# Patient Record
Sex: Female | Born: 1945 | Race: Black or African American | Hispanic: No | Marital: Married | Smoking: Never smoker
Health system: Southern US, Community
[De-identification: ages and names within clinical notes are randomized; demographics above are authoritative.]

## PROBLEM LIST (undated history)

## (undated) DIAGNOSIS — L659 Nonscarring hair loss, unspecified: Secondary | ICD-10-CM

## (undated) DIAGNOSIS — I10 Essential (primary) hypertension: Secondary | ICD-10-CM

## (undated) DIAGNOSIS — E785 Hyperlipidemia, unspecified: Secondary | ICD-10-CM

## (undated) DIAGNOSIS — H40009 Preglaucoma, unspecified, unspecified eye: Secondary | ICD-10-CM

## (undated) DIAGNOSIS — E119 Type 2 diabetes mellitus without complications: Secondary | ICD-10-CM

## (undated) DIAGNOSIS — D649 Anemia, unspecified: Secondary | ICD-10-CM

## (undated) DIAGNOSIS — D692 Other nonthrombocytopenic purpura: Secondary | ICD-10-CM

## (undated) DIAGNOSIS — R519 Headache, unspecified: Secondary | ICD-10-CM

## (undated) DIAGNOSIS — G629 Polyneuropathy, unspecified: Secondary | ICD-10-CM

## (undated) DIAGNOSIS — L680 Hirsutism: Secondary | ICD-10-CM

## (undated) DIAGNOSIS — M1990 Unspecified osteoarthritis, unspecified site: Secondary | ICD-10-CM

## (undated) DIAGNOSIS — E079 Disorder of thyroid, unspecified: Secondary | ICD-10-CM

## (undated) DIAGNOSIS — K589 Irritable bowel syndrome without diarrhea: Secondary | ICD-10-CM

## (undated) DIAGNOSIS — R195 Other fecal abnormalities: Secondary | ICD-10-CM

## (undated) DIAGNOSIS — R51 Headache: Secondary | ICD-10-CM

## (undated) DIAGNOSIS — N183 Chronic kidney disease, stage 3 unspecified: Secondary | ICD-10-CM

## (undated) DIAGNOSIS — E039 Hypothyroidism, unspecified: Secondary | ICD-10-CM

## (undated) DIAGNOSIS — M4800 Spinal stenosis, site unspecified: Secondary | ICD-10-CM

## (undated) DIAGNOSIS — E7800 Pure hypercholesterolemia, unspecified: Secondary | ICD-10-CM

## (undated) DIAGNOSIS — F32A Depression, unspecified: Secondary | ICD-10-CM

## (undated) DIAGNOSIS — I679 Cerebrovascular disease, unspecified: Secondary | ICD-10-CM

## (undated) DIAGNOSIS — G8929 Other chronic pain: Secondary | ICD-10-CM

## (undated) HISTORY — PX: NECK SURGERY: SHX720

## (undated) HISTORY — DX: Irritable bowel syndrome, unspecified: K58.9

## (undated) HISTORY — DX: Other nonthrombocytopenic purpura: D69.2

## (undated) HISTORY — DX: Nonscarring hair loss, unspecified: L65.9

## (undated) HISTORY — DX: Headache, unspecified: R51.9

## (undated) HISTORY — DX: Depression, unspecified: F32.A

## (undated) HISTORY — DX: Chronic kidney disease, stage 3 unspecified: N18.30

## (undated) HISTORY — DX: Anemia, unspecified: D64.9

## (undated) HISTORY — DX: Hirsutism: L68.0

## (undated) HISTORY — DX: Headache: R51

## (undated) HISTORY — DX: Hyperlipidemia, unspecified: E78.5

## (undated) HISTORY — DX: Cerebrovascular disease, unspecified: I67.9

## (undated) HISTORY — DX: Type 2 diabetes mellitus without complications: E11.9

## (undated) HISTORY — DX: Unspecified osteoarthritis, unspecified site: M19.90

## (undated) HISTORY — DX: Preglaucoma, unspecified, unspecified eye: H40.009

## (undated) HISTORY — DX: Other fecal abnormalities: R19.5

## (undated) HISTORY — DX: Polyneuropathy, unspecified: G62.9

---

## 1997-08-21 ENCOUNTER — Ambulatory Visit (HOSPITAL_COMMUNITY): Admission: RE | Admit: 1997-08-21 | Discharge: 1997-08-21 | Payer: Self-pay | Admitting: Obstetrics

## 1998-08-13 ENCOUNTER — Other Ambulatory Visit: Admission: RE | Admit: 1998-08-13 | Discharge: 1998-08-13 | Payer: Self-pay | Admitting: Obstetrics

## 1998-09-01 ENCOUNTER — Encounter: Payer: Self-pay | Admitting: Emergency Medicine

## 1998-09-01 ENCOUNTER — Emergency Department (HOSPITAL_COMMUNITY): Admission: EM | Admit: 1998-09-01 | Discharge: 1998-09-01 | Payer: Self-pay | Admitting: Emergency Medicine

## 1998-09-19 ENCOUNTER — Ambulatory Visit (HOSPITAL_COMMUNITY): Admission: RE | Admit: 1998-09-19 | Discharge: 1998-09-19 | Payer: Self-pay | Admitting: Obstetrics

## 1998-09-19 ENCOUNTER — Encounter: Payer: Self-pay | Admitting: Obstetrics

## 1999-06-14 ENCOUNTER — Encounter: Payer: Self-pay | Admitting: Orthopaedic Surgery

## 1999-06-14 ENCOUNTER — Ambulatory Visit (HOSPITAL_COMMUNITY): Admission: RE | Admit: 1999-06-14 | Discharge: 1999-06-14 | Payer: Self-pay | Admitting: Orthopaedic Surgery

## 1999-09-25 ENCOUNTER — Encounter: Payer: Self-pay | Admitting: Obstetrics

## 1999-09-25 ENCOUNTER — Ambulatory Visit (HOSPITAL_COMMUNITY): Admission: RE | Admit: 1999-09-25 | Discharge: 1999-09-25 | Payer: Self-pay | Admitting: Obstetrics

## 1999-09-25 ENCOUNTER — Other Ambulatory Visit: Admission: RE | Admit: 1999-09-25 | Discharge: 1999-09-25 | Payer: Self-pay | Admitting: Obstetrics

## 1999-10-04 ENCOUNTER — Ambulatory Visit (HOSPITAL_COMMUNITY): Admission: RE | Admit: 1999-10-04 | Discharge: 1999-10-04 | Payer: Self-pay | Admitting: Obstetrics

## 1999-10-04 ENCOUNTER — Encounter: Payer: Self-pay | Admitting: Obstetrics

## 2000-10-07 ENCOUNTER — Other Ambulatory Visit: Admission: RE | Admit: 2000-10-07 | Discharge: 2000-10-07 | Payer: Self-pay | Admitting: Obstetrics

## 2000-10-12 ENCOUNTER — Ambulatory Visit (HOSPITAL_COMMUNITY): Admission: RE | Admit: 2000-10-12 | Discharge: 2000-10-12 | Payer: Self-pay | Admitting: Obstetrics

## 2000-10-12 ENCOUNTER — Encounter: Payer: Self-pay | Admitting: Obstetrics

## 2001-08-13 ENCOUNTER — Ambulatory Visit (HOSPITAL_COMMUNITY): Admission: RE | Admit: 2001-08-13 | Discharge: 2001-08-13 | Payer: Self-pay | Admitting: Hematology and Oncology

## 2001-08-13 ENCOUNTER — Encounter (INDEPENDENT_AMBULATORY_CARE_PROVIDER_SITE_OTHER): Payer: Self-pay | Admitting: Specialist

## 2001-10-13 ENCOUNTER — Encounter: Payer: Self-pay | Admitting: Obstetrics

## 2001-10-13 ENCOUNTER — Ambulatory Visit (HOSPITAL_COMMUNITY): Admission: RE | Admit: 2001-10-13 | Discharge: 2001-10-13 | Payer: Self-pay | Admitting: Obstetrics

## 2001-11-18 ENCOUNTER — Encounter: Admission: RE | Admit: 2001-11-18 | Discharge: 2002-02-16 | Payer: Self-pay | Admitting: Orthopaedic Surgery

## 2001-12-09 ENCOUNTER — Encounter: Admission: RE | Admit: 2001-12-09 | Discharge: 2001-12-09 | Payer: Self-pay | Admitting: Orthopaedic Surgery

## 2001-12-09 ENCOUNTER — Encounter: Payer: Self-pay | Admitting: Orthopaedic Surgery

## 2002-02-17 ENCOUNTER — Encounter: Admission: RE | Admit: 2002-02-17 | Discharge: 2002-05-02 | Payer: Self-pay | Admitting: Orthopaedic Surgery

## 2002-09-22 ENCOUNTER — Observation Stay (HOSPITAL_COMMUNITY): Admission: EM | Admit: 2002-09-22 | Discharge: 2002-09-23 | Payer: Self-pay | Admitting: Emergency Medicine

## 2002-09-22 ENCOUNTER — Encounter: Payer: Self-pay | Admitting: Emergency Medicine

## 2002-09-23 ENCOUNTER — Encounter: Payer: Self-pay | Admitting: Internal Medicine

## 2002-10-10 ENCOUNTER — Encounter: Payer: Self-pay | Admitting: Orthopaedic Surgery

## 2002-10-10 ENCOUNTER — Encounter: Admission: RE | Admit: 2002-10-10 | Discharge: 2002-10-10 | Payer: Self-pay | Admitting: Orthopaedic Surgery

## 2002-10-17 ENCOUNTER — Encounter: Payer: Self-pay | Admitting: Obstetrics

## 2002-10-17 ENCOUNTER — Ambulatory Visit (HOSPITAL_COMMUNITY): Admission: RE | Admit: 2002-10-17 | Discharge: 2002-10-17 | Payer: Self-pay | Admitting: Obstetrics

## 2002-11-18 ENCOUNTER — Encounter: Payer: Self-pay | Admitting: Orthopaedic Surgery

## 2002-11-18 ENCOUNTER — Ambulatory Visit (HOSPITAL_COMMUNITY): Admission: RE | Admit: 2002-11-18 | Discharge: 2002-11-18 | Payer: Self-pay | Admitting: Orthopaedic Surgery

## 2002-11-22 ENCOUNTER — Encounter: Payer: Self-pay | Admitting: Radiology

## 2002-11-22 ENCOUNTER — Encounter: Admission: RE | Admit: 2002-11-22 | Discharge: 2002-11-22 | Payer: Self-pay | Admitting: Neurosurgery

## 2002-11-22 ENCOUNTER — Encounter: Payer: Self-pay | Admitting: Neurosurgery

## 2002-12-08 ENCOUNTER — Encounter: Admission: RE | Admit: 2002-12-08 | Discharge: 2002-12-08 | Payer: Self-pay | Admitting: Neurosurgery

## 2002-12-08 ENCOUNTER — Encounter: Payer: Self-pay | Admitting: Neurosurgery

## 2003-03-13 ENCOUNTER — Ambulatory Visit (HOSPITAL_COMMUNITY): Admission: RE | Admit: 2003-03-13 | Discharge: 2003-03-13 | Payer: Self-pay | Admitting: Obstetrics

## 2003-03-14 ENCOUNTER — Ambulatory Visit (HOSPITAL_COMMUNITY): Admission: RE | Admit: 2003-03-14 | Discharge: 2003-03-14 | Payer: Self-pay | Admitting: Obstetrics

## 2003-04-04 ENCOUNTER — Encounter (INDEPENDENT_AMBULATORY_CARE_PROVIDER_SITE_OTHER): Payer: Self-pay | Admitting: Specialist

## 2003-04-04 ENCOUNTER — Ambulatory Visit (HOSPITAL_COMMUNITY): Admission: RE | Admit: 2003-04-04 | Discharge: 2003-04-04 | Payer: Self-pay | Admitting: Obstetrics

## 2003-06-27 ENCOUNTER — Encounter: Admission: RE | Admit: 2003-06-27 | Discharge: 2003-06-27 | Payer: Self-pay | Admitting: Orthopaedic Surgery

## 2003-10-18 ENCOUNTER — Ambulatory Visit (HOSPITAL_COMMUNITY): Admission: RE | Admit: 2003-10-18 | Discharge: 2003-10-18 | Payer: Self-pay | Admitting: Obstetrics

## 2004-02-23 ENCOUNTER — Ambulatory Visit: Payer: Self-pay | Admitting: Family Medicine

## 2004-02-28 ENCOUNTER — Ambulatory Visit: Payer: Self-pay | Admitting: Oncology

## 2004-03-01 ENCOUNTER — Ambulatory Visit: Payer: Self-pay | Admitting: Gastroenterology

## 2004-05-20 ENCOUNTER — Encounter: Admission: RE | Admit: 2004-05-20 | Discharge: 2004-05-20 | Payer: Self-pay | Admitting: Neurosurgery

## 2004-10-18 ENCOUNTER — Ambulatory Visit (HOSPITAL_COMMUNITY): Admission: RE | Admit: 2004-10-18 | Discharge: 2004-10-18 | Payer: Self-pay | Admitting: Obstetrics

## 2004-11-04 ENCOUNTER — Ambulatory Visit (HOSPITAL_COMMUNITY): Admission: RE | Admit: 2004-11-04 | Discharge: 2004-11-04 | Payer: Self-pay | Admitting: Obstetrics

## 2004-11-29 ENCOUNTER — Encounter: Admission: RE | Admit: 2004-11-29 | Discharge: 2004-11-29 | Payer: Self-pay | Admitting: Neurosurgery

## 2005-08-01 ENCOUNTER — Inpatient Hospital Stay (HOSPITAL_COMMUNITY): Admission: RE | Admit: 2005-08-01 | Discharge: 2005-08-03 | Payer: Self-pay | Admitting: Neurosurgery

## 2005-12-08 ENCOUNTER — Ambulatory Visit (HOSPITAL_COMMUNITY): Admission: RE | Admit: 2005-12-08 | Discharge: 2005-12-08 | Payer: Self-pay | Admitting: Obstetrics

## 2007-02-03 ENCOUNTER — Ambulatory Visit (HOSPITAL_COMMUNITY): Admission: RE | Admit: 2007-02-03 | Discharge: 2007-02-03 | Payer: Self-pay | Admitting: Obstetrics

## 2008-02-07 ENCOUNTER — Encounter: Admission: RE | Admit: 2008-02-07 | Discharge: 2008-02-07 | Payer: Self-pay | Admitting: Obstetrics

## 2008-02-09 ENCOUNTER — Encounter: Admission: RE | Admit: 2008-02-09 | Discharge: 2008-02-09 | Payer: Self-pay | Admitting: Neurosurgery

## 2008-05-15 ENCOUNTER — Encounter: Admission: RE | Admit: 2008-05-15 | Discharge: 2008-05-15 | Payer: Self-pay | Admitting: Neurosurgery

## 2009-03-12 ENCOUNTER — Encounter: Admission: RE | Admit: 2009-03-12 | Discharge: 2009-03-12 | Payer: Self-pay | Admitting: Obstetrics

## 2009-03-19 ENCOUNTER — Encounter: Admission: RE | Admit: 2009-03-19 | Discharge: 2009-03-19 | Payer: Self-pay | Admitting: Obstetrics

## 2009-12-25 ENCOUNTER — Encounter: Admission: RE | Admit: 2009-12-25 | Discharge: 2009-12-25 | Payer: Self-pay | Admitting: Neurosurgery

## 2010-03-20 ENCOUNTER — Encounter: Admission: RE | Admit: 2010-03-20 | Discharge: 2010-03-20 | Payer: Self-pay | Admitting: Obstetrics

## 2010-05-12 ENCOUNTER — Encounter: Payer: Self-pay | Admitting: Obstetrics

## 2010-06-24 ENCOUNTER — Other Ambulatory Visit: Payer: Self-pay | Admitting: Neurosurgery

## 2010-06-24 DIAGNOSIS — M549 Dorsalgia, unspecified: Secondary | ICD-10-CM

## 2010-07-01 ENCOUNTER — Other Ambulatory Visit: Payer: Self-pay | Admitting: Neurosurgery

## 2010-07-01 ENCOUNTER — Ambulatory Visit
Admission: RE | Admit: 2010-07-01 | Discharge: 2010-07-01 | Disposition: A | Discharge status: Home / Self Care | Payer: Worker's Compensation | Source: Ambulatory Visit | Attending: Neurosurgery | Admitting: Neurosurgery

## 2010-07-01 DIAGNOSIS — M549 Dorsalgia, unspecified: Secondary | ICD-10-CM

## 2010-09-06 NOTE — Discharge Summary (Signed)
Lauren Garrett, Lauren Garrett           ACCOUNT NO.:  [id number]   MEDICAL RECORD NO.:  [id number]          PATIENT TYPE:  INP   LOCATION:  3035                         FACILITY:  [facility]   PHYSICIAN:  Hewitt Shorts, M.D.DATE OF BIRTH:  06-27-1945   DATE OF ADMISSION:  08/01/2005  DATE OF DISCHARGE:  08/03/2005                                 DISCHARGE SUMMARY   HISTORY OF PRESENT ILLNESS:  The patient is a 65 year old woman under the  care of Dr. Franky Macho who he has followed apparently for both difficulties  with her neck and back.  She had a previous C5/6 anterior cervical  discectomy and fusion by Dr. Gerlene Fee 13 years ago but has developed advanced  degenerative disc disease and spondylosis at the C4/5 and C6/7 levels and  Dr. Franky Macho admitted the patient for decompression and arthrodesis.  Details  of his admission history and physical examination are included in his  admission note.   HOSPITAL COURSE:  The patient is admitted by Dr. Franky Macho and underwent C4/5  and C6/7 anterior cervical discectomy and arthrodesis interbody implants and  plating.  She has done well following surgery.  She has been up and  ambulating.  However, she feels both her neck as well as underlying lumbar  stenosis limits her overall level of activity and function at home.  She is  afebrile.  Her wound is healing nicely.  She is moving all four extremities  well.   The patient is asked to be discharged today.  She has requested referral for  home health aide for assistance approximately two hours per day as well as  referral for home health physical therapy.  I have explained to the patient  that a home health aide is typically not covered by insurance and she is  going to follow up with that with the agency to which they are referred.  The patient was given instructions regarding wound care and activities.  She  is to return to see Dr. Franky Macho in a couple of weeks.  Dr. Franky Macho gave her  a prescription for  Percocet and Zanaflex.   DISCHARGE DIAGNOSES:  1.  Cervical spondylosis.  2.  Cervical degenerative disc disease.      Hewitt Shorts, M.D.  Electronically Signed     RWN/MEDQ  D:  08/03/2005  T:  08/04/2005  Job:  [id number]

## 2010-09-06 NOTE — Op Note (Signed)
NAMEMAYDELIN, DEMING                     ACCOUNT NO.:  [id number]   MEDICAL RECORD NO.:  [id number]                   PATIENT TYPE:  AMB   LOCATION:  SDC                                  FACILITY:  [facility]   PHYSICIAN:  Kathreen Cosier, M.D.           DATE OF BIRTH:  1945/12/15   DATE OF PROCEDURE:  04/04/2003  DATE OF DISCHARGE:                                 OPERATIVE REPORT   PREOPERATIVE DIAGNOSIS:  Endometrial polyp.   PROCEDURE:  D&C hysteroscopy.   ANESTHESIA:  General anesthesia.   SURGEON:  Kathreen Cosier, M.D.   DESCRIPTION OF PROCEDURE:  Under general anesthesia, with the patient in the  lithotomy position, perineum and vagina prepped and draped.  Bladder emptied  with straight catheter.  Bimanual examination revealed the uterus to be 12-  weeks size and large with multiple small myomas.  Negative adnexa.  Weighted  speculum placed in the vagina.  Anterior lip of the cervix grasped with  tenaculum.  The endocervix was curetted.  A small amount of tissue obtained.  Endometrial cavity sounded 10 cm.  Cervix dilated to #24 Shawnie Pons.  The  diagnostic hysteroscope was inserted and the cavity examined and no polyps  were noted.  The hysteroscope was removed and a sharp curettage was  performed.  A large amount of tissue obtained.  Polyp forceps were inserted.  No polyps obtained.  The scope was reinserted and the cavity was clean.  The  fluid deficit was 6 mL.  The patient tolerated the procedure well and was  taken to the recovery room in good condition.                                               Kathreen Cosier, M.D.    BAM/MEDQ  D:  04/04/2003  T:  04/04/2003  Job:  [id number]

## 2010-09-06 NOTE — Procedures (Signed)
[facility]  Patient:    Lauren Garrett, Lauren Garrett Visit Number: [id number] MRN: [id number]          Service Type: OUT Location: OMED Attending Physician:  Janan Halter Dictated by:   Lowell C. Catha Gosselin, M.D. Proc. Date: 08/13/01 Admit Date:  08/13/2001                             Procedure Report  DATE OF BIRTH:  March 22, 1945  PROCEDURE:  Bone marrow biopsy and aspiration.  INDICATION:  Persistent normocytic anemia, family history of myeloproliferative disease.  DESCRIPTION OF PROCEDURE:  From right posterior iliac crest, bone marrow biopsy and aspiration were performed under 2% lidocaine local.  There was minimal bleeding, no obvious complications.  Standard Jamshidi needle was used.  Aspirate was sent off for flow and cytometry and for cytogenetics as well.  In addition, a serum sample was sent for hemoglobin electrophoresis. The patient was instructed to remove her bandage tomorrow and shower.  She will call [phone] for any problems or questions, and will see her back next week to review the results. Dictated by:   Lowell C. Catha Gosselin, M.D. Attending Physician:  Janan Halter DD:  08/13/01 TD:  08/13/01 Job: [phone] [id number]

## 2010-09-06 NOTE — H&P (Signed)
Lauren Garrett, Lauren Garrett                     ACCOUNT NO.:  [id number]   MEDICAL RECORD NO.:  [id number]                   PATIENT TYPE:  OBV   LOCATION:  0373                                 FACILITY:  [facility]   PHYSICIAN:  Gaspar Garbe, M.D.            DATE OF BIRTH:  [birth year]   DATE OF ADMISSION:  09/22/2002  DATE OF DISCHARGE:                                HISTORY & PHYSICAL   CHIEF COMPLAINT:  Syncopal episode, chronic back pain.   HISTORY OF PRESENT ILLNESS:  The patient is a 65 year old black female with  a history of hypertension, hypothyroidism, and non-toxic nodular goiter, as  well as anemia who has not been seen at [facility] since December 02, 2001, and has missed subsequent appointments.  I was notified that she was  at the emergency room after having a syncopal episode.  She indicates that  she has been having pain for a period of just slightly less then a year, and  has been followed by Dr. Cleophas Dunker of orthopaedics.  She indicates that she  has had back injections in the months of April and May, but the most recent  unable to control her pain, and has been given Naprosyn and Skelaxin.  She  is making an attempt to go back to work on the day of admission, and had  gotten up to try to get herself breakfast, in spite of the amount of pain  that she had, at which point a large thud was heard and the patient was  found to be groggy on the floor of her kitchen.  The patient remembers going  to the refrigerator, but does not remember anything after that event other  then being looked at by her family, none of which who actually witnessed the  episode.  The patient was taken to the emergency room and evaluated and was  decided to put in for observation admission.   PAST MEDICAL HISTORY:  1. Hypertension.  2. Hypothyroidism.  3. Anemia.  4. Non-toxic nodular goiter.  5. Elevated lipids.  6. Irritable bowel syndrome.   PAST SURGICAL HISTORY:  C4, C5, and  C6 cervical surgery by Dr. Gerlene Fee  approximately 10 years ago.   SOCIAL HISTORY:  The patient is married with two children.  She works at  [employer].  She is a nonsmoker and nondrinker.   FAMILY HISTORY:  Father is alive with a history of stroke, hypertension,  knee and back problems, as well as renal and lung cancer.  Mother is alive  with a history of blood clots and mild hypertension.  She states  grandparents died of cervical cancer and an aunt died of breast cancer.  She  had one brother who has diabetes and alcohol abuse, pancreatitis, and two  healthy children.   ALLERGIES:  LEVAQUIN.   MEDICATIONS:  1. Synthroid 0.125 mg one tablet daily.  2. Maxzide 37.5/25 mg one tablet b.i.d.  3.  Naprosyn 500 mg b.i.d.  4. Skelaxin 800 mg t.i.d.  5. Iron supplement b.i.d.   REVIEW OF SYMPTOMS:  The patient complains currently of neck and low back  pain, soreness, as well as pain radiating to her left hip.   PHYSICAL EXAMINATION:  VITAL SIGNS:  Temperature afebrile, blood pressure  138/86, saturation 99% on room air, pulse 86.  GENERAL:  In no acute distress.  HEENT:  Normocephalic, atraumatic.  Pupils equal, round, reactive to light  and accommodation.  Extraocular movements were intact.  ENT within normal  limits.  NECK:  Supple, no lymphadenopathy, JVD, or bruit.  HEART:  Regular rate and rhythm, no murmurs, rubs, or gallops.  LUNGS:  Clear to auscultation bilaterally.  ABDOMEN:  Soft, nontender, normoactive bowel sounds, no rebound or guarding.  EXTREMITIES:  No cyanosis, clubbing, or edema.  NEUROLOGIC:  Cranial nerves II-XII are intact.  The patient is oriented x3  with normal grip strength and no cerebellar findings.  SKIN:  No rashes or lesions.  MUSCULOSKELETAL:  The patient does not have any point tenderness in her  back, but notes pain into the lumbar muscles on the left side, as well as  her left hip.  The patient was not walked in the emergency room.   LABORATORY  DATA:  The patient had a CT scan of her hip which was negative.  EKG which was negative.  Chest x-ray which was negative.  Had mild  hypokalemia of 3.2 which was replaced, and a slightly elevated non-fasting  glucose of 128.  BUN and creatinine were 10 and 0.9, respectively, with  normal liver function tests.  Troponin-I was 0.01, CK was 188, CK-MB was  3.6, hemoglobin 12.1, and white blood cell count of 7, platelets 389.   ASSESSMENT AND PLAN:  1. Syncopal episode.  I think this is most likely pain induced, but we will     rule out for myocardial infarction and keep her on telemetry.  Check a     TSH as she has missed her last appointment.  Although, I do not believe     that this would necessarily influence a syncopal episode.  Her low     potassium would not either.  We will keep an eye on this as she is on a     potassium sparing and potassium wasting diuretic combination.  She will     be in the hospital at least overnight for observation, and although     workup is negative, will be cleared from a medical perspective.  2. Chronic back pain.  I spoke with Dr. Mckinley Jewel as he had been on call     for Dr. Cleophas Dunker.  He indicated that he did not do back surgery, and     suggested that I call Dr. Zachery Dakins, and a call has been placed to Dr.     Gerlene Fee at [facility] for consultation, as he has seen her in     the past for her neck surgery.  The patient also requested that Dr.     Cleophas Dunker see the patient tomorrow if he is unable to see her on the day     of admission.  I believe that her syncopal episode is most likely pain     induced, and I have started her on Vicodin for pain, as well as morphine     for breakthrough.  3. Hypothyroidism.  We will check TSH and make adjustments as necessary.  4. Hypertension.  We will continue to follow her on her current regimen. 5. Anemia.  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  We will continue her on her     iron supplements.  She has missed her follow up appointment with Dr.     Darnelle Catalan as well, and will need to schedule this following her discharge     from the hospital.                                                 Gaspar Garbe, M.D.   RWT/MEDQ  D:  09/23/2002  T:  09/23/2002  Job:  [id number]

## 2010-09-06 NOTE — Discharge Summary (Signed)
Lauren Garrett, Lauren Garrett                     ACCOUNT NO.:  [id number]   MEDICAL RECORD NO.:  [id number]                   PATIENT TYPE:  OBV   LOCATION:  0373                                 FACILITY:  [facility]   PHYSICIAN:  Gaspar Garbe, M.D.            DATE OF BIRTH:  19-May-1945   DATE OF ADMISSION:  09/22/2002  DATE OF DISCHARGE:  09/23/2002                                 DISCHARGE SUMMARY   DISCHARGE DIAGNOSES:  1. Syncopal episode, presumed vasovagal.  2. Chronic back pain.  3. Hypothyroidism.  4. Hypokalemia.  5. Hypertension.   TESTS:  Patient had x-rays of hip, pelvis, L spine and T spine all of which  have been within normal limits.   LABS ADMITTING:  Potassium 3.2 and replaced.   DISCHARGE LABS:  Show a BUN of 9 with a creatinine of 0.8.  Mild anemia of  11.3 hemoglobin.  She is currently being followed by heme/onc.  Troponin  0.02, CK elevated at 347 with an MB component of 4.4 consistent with muscle  injury and a negative urinalysis.   SUMMARY HOSPITAL COURSE:  Patient was admitted on September 22, 2002 through the  emergency room secondary to having a syncopal episode at home.  Please see  H&P for details.  Patient was watched overnight on telemetry with no  telemetric changes and was ruled out for MI.  She had normal electrolytes  and thyroid studies.  Given her history it was thought that her syncopal  episode might have been due to pain or anxiety and vasovagal in nature.  Patient was seen by Dr. Haynes Kerns. A. as well as Dr. Gerlene Fee prior to  discharge and followup is planned to be seen with Dr. Gerlene Fee in his office  on Monday September 26, 2002.  Patient had some pain response with Vicodin and was  to be continued at the time of discharge as well.   MEDICATIONS:  1. Vicodin 1-2 tablets q.4h. p.r.n. pain.  2. She has been discontinued on her Skelaxin and Naprosyn.  3. Synthroid 125 mcg p.o. daily.  4. Maxzide 37.5/25 one tablet p.o. b.i.d.    FOLLOW UP:   Patient is to continue her regular followup with Dr. Wylene Simmer for  a complete physical exam as scheduled in September and she is to followup  with Dr. Gerlene Fee on the upcoming Monday and Dr. Cleophas Dunker as needed.                                                Gaspar Garbe, M.D.    RWT/MEDQ  D:  09/23/2002  T:  09/23/2002  Job:  [id number]   cc:   [name], M.D.  [address] [place]  [place] [address]  Fax: [phone]   [name], M.D.  [address]  [place]  [place] [address]  Fax: [phone]

## 2010-09-06 NOTE — Op Note (Signed)
NAMESUKHMANI, FETHEROLF           ACCOUNT NO.:  [id number]   MEDICAL RECORD NO.:  [id number]          PATIENT TYPE:  INP   LOCATION:  2899                         FACILITY:  [facility]   PHYSICIAN:  Coletta Memos, M.D.     DATE OF BIRTH:  1945/06/17   DATE OF PROCEDURE:  08/01/2005  DATE OF DISCHARGE:                                 OPERATIVE REPORT   PREOPERATIVE DIAGNOSES:  1.  Cervical spondylosis with myelopathy, C4-5, C6-7.  2.  Cervical stenosis.  3.  Cervical degenerative disk disease with myelopathy.   POSTOPERATIVE DIAGNOSES:  1.  Cervical spondylosis with myelopathy, C4-5, C6-7.  2.  Cervical stenosis.  3.  Cervical degenerative disk disease with myelopathy.   PROCEDURE:  1.  Anterior cervical decompression, C4-5, C6-7.  2.  Arthrodesis, C4-5, C6-7, with interbody graft, 8-mm P cage placed in C4-      5, 7-mm P cage placed in C6-7, morselized allograft DBX bone putty      placed into the P cages.  3.  Anterior plating 16-mm plate at Z6-1, 09-UE plate at A5-4.   COMPLICATIONS:  None.   SURGEON:  Coletta Memos, M.D.   INDICATIONS:  Tayen Dunlop is a patient who had undergone an anterior  cervical decompression at C5-6 in the past.  She had significant  degenerative changes at C4-5 and at C5-6 leading to degenerative disk  disease, cervical myelopathy secondary to cord compression and cervical  stenosis.  I recommended and she agreed to undergo operative decompression.   OPERATIVE NOTE:  Ms. Dwiggins was brought to the operating room, intubated  and placed under general anesthetic without difficulty.  She was positioned  with the head in essentially neutral position on a horseshoe.  Her neck was  prepped, and she was draped in a sterile fashion.   I opened the skin with a #10 blade after infiltrating 4 cc 1/2% lidocaine  into an old incision on the right side of her neck.  I opened an incision  with a #10 blade and took this down through scar tissue until I was able  to  identify the sternocleidomastoid muscle.  Then, with further sharp  dissection, was able to create a plane to the anterior cervical spine.  I  placed a spinal needle, and it showed that one needle was at C4-5, the other  at C3-4.  I then used that as a guide and opened the disk space at C4-5 with  a #15 blade.  I then placed two distraction pins, one in C4 and one at C5.  I reflected the longus colli muscles, placed a self-retaining retractor,  then placed the distraction pins.  I brought the microscope into the  operative field and proceeded with a diskectomy and decompression at C4-5.  There was a significant amount of osteophyte formation posteriorly and  osteophyte within the disk space.  Using a high-speed drill, Kerrison  punches and curettes, I removed soft disk material, end plate and then bone  until I was able to decompress the posterior longitudinal ligament.  I then  opened the posterior longitudinal ligament and removed that.  There was,  however, an adherent piece of partially calcified ligament and soft tissue  on the left side.  I could not separate this via blunt or sharp dissection  from the dura.  I did not believe that a durotomy was in Ms. Namba's best  interest, so I therefore completed my decompression on the right side.  I  removed the bony entrapment on the left side, and lateral to this partially  calcified mass, I was able to again remove soft tissue.  The cord was  pulsatile on the right side where I could see through the dura, and there  was certainly no pressure from aboveon this mass, so I felt at this point  that adequate decompression could be done without having Ms. Asay incur  greater risk.  I then prepared the endplates for arthrodesis.  I placed an 8-  mm P graft filled with DBX putting into the space.  I removed the  distraction pins and took an x-ray, and this showed that the plate screws  and plugs in good position.  I placed a 16-mm plate  there, first by drilling  and then using self-tapping screws, two screws in C4, two screws in C5.   I then placed a self-retaining retractor to overlie the disk space at C6-7.  I used a Leksell rongeur to remove more very large anterior osteophytes.  Used a 15 blade to open the disk space.  Then placed distraction pins, one  at C6, the other at C7.  I distracted the space.  I brought the microscope  into the operative field, and again with the use of a high-speed drill and  Kerrison punches, removed osteophytes both anteriorly and along the  posterior aspect of the vertebral bodies at C6 and C7.  I then opened the  posterior longitudinal ligament and removed that very thickened ligament to  expose the dura and decompress it.  Both C7 nerve roots were well  decompressed.  After the decompression was done, I turned my attention to  the arthrodesis.   I prepared the endplates for arthrodesis.  I then placed a 7-mm graft, Peak  Synthes ACS graft filled with DBX putty.  I removed the distraction pins.  I  then placed a 14-mm Synthes__________  plate, two screws in C6, two screws  in C7. Three screws were self-tapping; one screw was self-drilling.  Her  bone was extraordinarily dense at all levels.  I then irrigated the wound.  Dr. Newell Coral assisted with both the arthrodesis and anterior plating in both  instances.  We then closed the wound in layered fashion using Vicryl sutures  after ensuring hemostasis.  The platysma was reapproximated in subcuticular  layers.  Dermabond used for sterile dressing.           ______________________________  Coletta Memos, M.D.     KC/MEDQ  D:  08/01/2005  T:  08/01/2005  Job:  [id number]

## 2010-10-08 ENCOUNTER — Other Ambulatory Visit: Payer: Self-pay | Admitting: Neurosurgery

## 2010-10-08 DIAGNOSIS — M549 Dorsalgia, unspecified: Secondary | ICD-10-CM

## 2010-10-14 ENCOUNTER — Ambulatory Visit
Admission: RE | Admit: 2010-10-14 | Discharge: 2010-10-14 | Disposition: A | Discharge status: Home / Self Care | Payer: Worker's Compensation | Source: Ambulatory Visit | Attending: Neurosurgery | Admitting: Neurosurgery

## 2010-10-14 DIAGNOSIS — M549 Dorsalgia, unspecified: Secondary | ICD-10-CM

## 2010-12-11 ENCOUNTER — Other Ambulatory Visit: Payer: Self-pay | Admitting: Obstetrics

## 2010-12-13 ENCOUNTER — Telehealth: Payer: Self-pay | Admitting: Gastroenterology

## 2010-12-13 NOTE — Telephone Encounter (Signed)
Requested chart will have to wait until it comes for review.

## 2010-12-16 NOTE — Telephone Encounter (Signed)
Left message pt needs office visit to discuss colonoscopy since last colonoscopy was done for anemia, I have asked she make an appt and have her primary care md fax over labs and office visit notes for the appt. She can call back with questions/

## 2011-03-12 ENCOUNTER — Other Ambulatory Visit: Payer: Self-pay | Admitting: Obstetrics

## 2011-03-12 DIAGNOSIS — Z1231 Encounter for screening mammogram for malignant neoplasm of breast: Secondary | ICD-10-CM

## 2011-04-03 ENCOUNTER — Other Ambulatory Visit (HOSPITAL_COMMUNITY): Payer: Self-pay | Admitting: Obstetrics

## 2011-04-10 ENCOUNTER — Ambulatory Visit (HOSPITAL_COMMUNITY): Payer: Worker's Compensation

## 2011-04-28 ENCOUNTER — Ambulatory Visit: Payer: Worker's Compensation

## 2011-05-01 ENCOUNTER — Ambulatory Visit (HOSPITAL_COMMUNITY): Payer: Worker's Compensation

## 2011-05-08 ENCOUNTER — Ambulatory Visit (HOSPITAL_COMMUNITY)
Admission: RE | Admit: 2011-05-08 | Discharge: 2011-05-08 | Disposition: A | Discharge status: Home / Self Care | Payer: Medicare Other | Source: Ambulatory Visit | Attending: Obstetrics | Admitting: Obstetrics

## 2011-05-08 DIAGNOSIS — D251 Intramural leiomyoma of uterus: Secondary | ICD-10-CM | POA: Insufficient documentation

## 2011-05-08 DIAGNOSIS — N83209 Unspecified ovarian cyst, unspecified side: Secondary | ICD-10-CM | POA: Insufficient documentation

## 2011-05-15 ENCOUNTER — Ambulatory Visit
Admission: RE | Admit: 2011-05-15 | Discharge: 2011-05-15 | Disposition: A | Discharge status: Home / Self Care | Payer: Medicare Other | Source: Ambulatory Visit | Attending: Obstetrics | Admitting: Obstetrics

## 2011-05-15 DIAGNOSIS — Z1231 Encounter for screening mammogram for malignant neoplasm of breast: Secondary | ICD-10-CM

## 2011-05-23 ENCOUNTER — Other Ambulatory Visit: Payer: Self-pay | Admitting: Neurosurgery

## 2011-05-23 DIAGNOSIS — M48061 Spinal stenosis, lumbar region without neurogenic claudication: Secondary | ICD-10-CM

## 2011-06-02 ENCOUNTER — Other Ambulatory Visit: Payer: Self-pay | Admitting: Neurosurgery

## 2011-06-02 ENCOUNTER — Ambulatory Visit
Admission: RE | Admit: 2011-06-02 | Discharge: 2011-06-02 | Disposition: A | Discharge status: Home / Self Care | Payer: Worker's Compensation | Source: Ambulatory Visit | Attending: Neurosurgery | Admitting: Neurosurgery

## 2011-06-02 DIAGNOSIS — M48061 Spinal stenosis, lumbar region without neurogenic claudication: Secondary | ICD-10-CM

## 2011-11-26 ENCOUNTER — Encounter (HOSPITAL_COMMUNITY): Payer: Self-pay | Admitting: Emergency Medicine

## 2011-11-26 ENCOUNTER — Emergency Department (HOSPITAL_COMMUNITY)
Admission: EM | Admit: 2011-11-26 | Discharge: 2011-11-26 | Discharge status: Against Medical Advice | Payer: Self-pay | Attending: Emergency Medicine | Admitting: Emergency Medicine

## 2011-11-26 DIAGNOSIS — M25519 Pain in unspecified shoulder: Secondary | ICD-10-CM | POA: Insufficient documentation

## 2011-11-26 DIAGNOSIS — E7800 Pure hypercholesterolemia, unspecified: Secondary | ICD-10-CM | POA: Insufficient documentation

## 2011-11-26 DIAGNOSIS — R51 Headache: Secondary | ICD-10-CM | POA: Insufficient documentation

## 2011-11-26 DIAGNOSIS — M25559 Pain in unspecified hip: Secondary | ICD-10-CM | POA: Insufficient documentation

## 2011-11-26 HISTORY — DX: Essential (primary) hypertension: I10

## 2011-11-26 HISTORY — DX: Other chronic pain: G89.29

## 2011-11-26 HISTORY — DX: Pure hypercholesterolemia, unspecified: E78.00

## 2011-11-26 HISTORY — DX: Disorder of thyroid, unspecified: E07.9

## 2011-11-26 NOTE — ED Notes (Signed)
Pt c/o headache since falling on Sunday and hitting head; pt sts some pain in right shoulder and right hip; pt ambulatory at present; pt denies LOC

## 2011-11-27 ENCOUNTER — Emergency Department (HOSPITAL_COMMUNITY): Payer: Medicare Other

## 2011-11-27 ENCOUNTER — Emergency Department (HOSPITAL_COMMUNITY)
Admission: EM | Admit: 2011-11-27 | Discharge: 2011-11-27 | Disposition: A | Discharge status: Home / Self Care | Payer: Medicare Other | Attending: Emergency Medicine | Admitting: Emergency Medicine

## 2011-11-27 ENCOUNTER — Encounter (HOSPITAL_COMMUNITY): Payer: Self-pay | Admitting: Emergency Medicine

## 2011-11-27 DIAGNOSIS — R51 Headache: Secondary | ICD-10-CM | POA: Insufficient documentation

## 2011-11-27 DIAGNOSIS — W1809XA Striking against other object with subsequent fall, initial encounter: Secondary | ICD-10-CM | POA: Insufficient documentation

## 2011-11-27 DIAGNOSIS — Z79899 Other long term (current) drug therapy: Secondary | ICD-10-CM | POA: Insufficient documentation

## 2011-11-27 DIAGNOSIS — I10 Essential (primary) hypertension: Secondary | ICD-10-CM | POA: Insufficient documentation

## 2011-11-27 DIAGNOSIS — F0781 Postconcussional syndrome: Secondary | ICD-10-CM

## 2011-11-27 LAB — POCT I-STAT, CHEM 8
BUN: 16 mg/dL (ref 6–23)
Chloride: 103 mEq/L (ref 96–112)
Creatinine, Ser: 1 mg/dL (ref 0.50–1.10)
Sodium: 143 mEq/L (ref 135–145)

## 2011-11-27 MED ORDER — METOCLOPRAMIDE HCL 5 MG/ML IJ SOLN
5.0000 mg | Freq: Once | INTRAMUSCULAR | Status: AC
  Administered 2011-11-27: 5 mg via INTRAVENOUS
  Filled 2011-11-27: qty 2

## 2011-11-27 MED ORDER — LORAZEPAM 2 MG/ML IJ SOLN
0.5000 mg | Freq: Once | INTRAMUSCULAR | Status: AC
  Administered 2011-11-27: 0.5 mg via INTRAVENOUS
  Filled 2011-11-27: qty 1

## 2011-11-27 MED ORDER — DIPHENHYDRAMINE HCL 50 MG/ML IJ SOLN
25.0000 mg | Freq: Once | INTRAMUSCULAR | Status: AC
  Administered 2011-11-27: 25 mg via INTRAVENOUS
  Filled 2011-11-27: qty 1

## 2011-11-27 NOTE — ED Notes (Signed)
Patient transported to radiology

## 2011-11-27 NOTE — ED Provider Notes (Signed)
History     CSN: [id number]  Arrival date & time 11/27/11  1036   First MD Initiated Contact with Patient 11/27/11 1042      Chief Complaint  Patient presents with  . Headache    (Consider location/radiation/quality/duration/timing/severity/associated sxs/prior treatment) The history is provided by the patient.   Patient here complaining of right-sided head pain after falling 4 days ago. Patient says she tripped and struck the right side of her head without loss of consciousness. No vomiting or confusion since then. Denies any neck pain. Headache has been dull and intermittent. Nothing makes it better worse. Denies any fever. No upper or lower extremity paresthesias. Did note an increased blood pressure at home but knows that she did Miss 2 days of her antihypertensive medication. Denies any chest pain or dyspnea Past Medical History  Diagnosis Date  . Hypertension   . Thyroid disease   . Chronic pain   . Hypercholesteremia     History reviewed. No pertinent past surgical history.  History reviewed. No pertinent family history.  History  Substance Use Topics  . Smoking status: Never Smoker   . Smokeless tobacco: Not on file  . Alcohol Use: No    OB History    Grav Para Term Preterm Abortions TAB SAB Ect Mult Living                  Review of Systems  All other systems reviewed and are negative.    Allergies  Macrobid  Home Medications  No current outpatient prescriptions on file.  BP 182/72  Pulse 75  Temp 98.2 F (36.8 C) (Oral)  Resp 16  Ht 5\' 8"  (1.727 m)  Wt 193 lb (87.544 kg)  BMI 29.35 kg/m2  SpO2 100%  Physical Exam  Nursing note and vitals reviewed. Constitutional: She is oriented to person, place, and time. She appears well-developed and well-nourished.  Non-toxic appearance. No distress.  HENT:  Head: Normocephalic and atraumatic.  Eyes: Conjunctivae, EOM and lids are normal. Pupils are equal, round, and reactive to light.  Neck: Normal  range of motion. Neck supple. No tracheal deviation present. No mass present.  Cardiovascular: Normal rate, regular rhythm and normal heart sounds.  Exam reveals no gallop.   No murmur heard. Pulmonary/Chest: Effort normal and breath sounds normal. No stridor. No respiratory distress. She has no decreased breath sounds. She has no wheezes. She has no rhonchi. She has no rales.  Abdominal: Soft. Normal appearance and bowel sounds are normal. She exhibits no distension. There is no tenderness. There is no rebound and no CVA tenderness.  Musculoskeletal: Normal range of motion. She exhibits no edema and no tenderness.  Neurological: She is alert and oriented to person, place, and time. She has normal strength. No cranial nerve deficit or sensory deficit. GCS eye subscore is 4. GCS verbal subscore is 5. GCS motor subscore is 6.  Skin: Skin is warm and dry. No abrasion and no rash noted.  Psychiatric: She has a normal mood and affect. Her speech is normal and behavior is normal.    ED Course  Procedures (including critical care time)  Labs Reviewed - No data to display No results found.   No diagnosis found.    MDM  Pt given meds for headache, feels better--renal function nl--repeat bp improved--pt will take her b/p meds as directed        Toy Baker, MD 11/27/11 1255

## 2011-11-27 NOTE — ED Notes (Signed)
Patient transported to X-ray 

## 2011-11-27 NOTE — ED Notes (Signed)
Pt ambulated to RR .  

## 2011-11-27 NOTE — ED Notes (Signed)
Pt with trip and fall on Sunday and patient hit her head on the nightstand. Pt has been c/o headache ever since hitting her head. Came here yesterday for same but left before having head CT done.

## 2011-11-27 NOTE — ED Notes (Signed)
Pt undressed and into a gown, pt placed on continuous pulse oximetry and blood pressure cuff; blanket given

## 2011-12-04 ENCOUNTER — Telehealth: Payer: Self-pay | Admitting: Gastroenterology

## 2011-12-04 NOTE — Telephone Encounter (Signed)
Informed pt I had to send for her chart; will call when I get it. Pt stated understanding.

## 2011-12-05 NOTE — Telephone Encounter (Signed)
Chart shows last COLON 03/01/2004 was normal; previous COLON was in 2000. There is no note on the last exam and no path. Can you please give me the RECALL date; pt has not had any problems? Chart is in your in box. Thanks.

## 2011-12-08 NOTE — Telephone Encounter (Signed)
lmom for pt to call back

## 2011-12-08 NOTE — Telephone Encounter (Signed)
Needs ov

## 2011-12-11 NOTE — Telephone Encounter (Signed)
lmom for pt to call back. She needs an OV with Dr Jarold Motto.

## 2011-12-24 NOTE — Telephone Encounter (Signed)
Pt never called back.

## 2012-01-12 ENCOUNTER — Other Ambulatory Visit (HOSPITAL_COMMUNITY): Payer: Self-pay | Admitting: Obstetrics

## 2012-01-12 DIAGNOSIS — N949 Unspecified condition associated with female genital organs and menstrual cycle: Secondary | ICD-10-CM

## 2012-01-15 ENCOUNTER — Ambulatory Visit (HOSPITAL_COMMUNITY)
Admission: RE | Admit: 2012-01-15 | Discharge: 2012-01-15 | Disposition: A | Discharge status: Home / Self Care | Payer: Medicare Other | Source: Ambulatory Visit | Attending: Obstetrics | Admitting: Obstetrics

## 2012-01-15 DIAGNOSIS — Z780 Asymptomatic menopausal state: Secondary | ICD-10-CM | POA: Insufficient documentation

## 2012-01-15 DIAGNOSIS — N949 Unspecified condition associated with female genital organs and menstrual cycle: Secondary | ICD-10-CM

## 2012-01-15 DIAGNOSIS — N9489 Other specified conditions associated with female genital organs and menstrual cycle: Secondary | ICD-10-CM | POA: Insufficient documentation

## 2012-01-15 DIAGNOSIS — D251 Intramural leiomyoma of uterus: Secondary | ICD-10-CM | POA: Insufficient documentation

## 2012-03-05 ENCOUNTER — Other Ambulatory Visit: Payer: Self-pay | Admitting: Neurosurgery

## 2012-03-05 DIAGNOSIS — M48061 Spinal stenosis, lumbar region without neurogenic claudication: Secondary | ICD-10-CM

## 2012-03-26 ENCOUNTER — Other Ambulatory Visit: Payer: BC Managed Care – PPO

## 2012-04-22 ENCOUNTER — Other Ambulatory Visit: Payer: Self-pay | Admitting: Neurosurgery

## 2012-04-22 ENCOUNTER — Other Ambulatory Visit: Payer: BC Managed Care – PPO

## 2012-04-22 ENCOUNTER — Ambulatory Visit
Admission: RE | Admit: 2012-04-22 | Discharge: 2012-04-22 | Disposition: A | Discharge status: Home / Self Care | Payer: Worker's Compensation | Source: Ambulatory Visit | Attending: Neurosurgery | Admitting: Neurosurgery

## 2012-04-22 VITALS — BP 200/74 | HR 69

## 2012-04-22 DIAGNOSIS — M48061 Spinal stenosis, lumbar region without neurogenic claudication: Secondary | ICD-10-CM

## 2012-04-22 MED ORDER — IOHEXOL 180 MG/ML  SOLN
1.0000 mL | Freq: Once | INTRAMUSCULAR | Status: AC | PRN
  Administered 2012-04-22: 1 mL via EPIDURAL

## 2012-04-22 MED ORDER — METHYLPREDNISOLONE ACETATE 40 MG/ML INJ SUSP (RADIOLOG
120.0000 mg | Freq: Once | INTRAMUSCULAR | Status: AC
  Administered 2012-04-22: 120 mg via EPIDURAL

## 2012-06-17 ENCOUNTER — Other Ambulatory Visit: Payer: Self-pay

## 2012-06-28 ENCOUNTER — Ambulatory Visit
Admission: RE | Admit: 2012-06-28 | Discharge: 2012-06-28 | Disposition: A | Discharge status: Home / Self Care | Payer: Medicare Other | Source: Ambulatory Visit

## 2012-06-28 DIAGNOSIS — Z1231 Encounter for screening mammogram for malignant neoplasm of breast: Secondary | ICD-10-CM

## 2012-09-03 ENCOUNTER — Other Ambulatory Visit: Payer: Self-pay | Admitting: Neurosurgery

## 2012-09-03 DIAGNOSIS — M48061 Spinal stenosis, lumbar region without neurogenic claudication: Secondary | ICD-10-CM

## 2012-11-29 ENCOUNTER — Inpatient Hospital Stay: Admission: RE | Admit: 2012-11-29 | Payer: Medicare Other | Source: Ambulatory Visit

## 2012-12-29 ENCOUNTER — Ambulatory Visit
Admission: RE | Admit: 2012-12-29 | Discharge: 2012-12-29 | Disposition: A | Discharge status: Home / Self Care | Payer: Self-pay | Source: Ambulatory Visit | Attending: Neurosurgery | Admitting: Neurosurgery

## 2012-12-29 VITALS — BP 187/83 | HR 68

## 2012-12-29 DIAGNOSIS — M48061 Spinal stenosis, lumbar region without neurogenic claudication: Secondary | ICD-10-CM

## 2012-12-29 MED ORDER — IOHEXOL 180 MG/ML  SOLN
1.0000 mL | Freq: Once | INTRAMUSCULAR | Status: AC | PRN
  Administered 2012-12-29: 1 mL via EPIDURAL

## 2012-12-29 MED ORDER — METHYLPREDNISOLONE ACETATE 40 MG/ML INJ SUSP (RADIOLOG
120.0000 mg | Freq: Once | INTRAMUSCULAR | Status: AC
  Administered 2012-12-29: 120 mg via EPIDURAL

## 2013-01-21 ENCOUNTER — Ambulatory Visit: Payer: Medicare Other | Admitting: Neurology

## 2013-01-21 ENCOUNTER — Encounter: Payer: Self-pay | Admitting: Neurology

## 2013-02-02 ENCOUNTER — Ambulatory Visit (INDEPENDENT_AMBULATORY_CARE_PROVIDER_SITE_OTHER): Payer: Medicare Other | Admitting: Neurology

## 2013-02-02 ENCOUNTER — Encounter (INDEPENDENT_AMBULATORY_CARE_PROVIDER_SITE_OTHER): Payer: Self-pay

## 2013-02-02 ENCOUNTER — Telehealth: Payer: Self-pay | Admitting: Neurology

## 2013-02-02 ENCOUNTER — Encounter: Payer: Self-pay | Admitting: Neurology

## 2013-02-02 VITALS — BP 170/73 | HR 67 | Ht 67.0 in | Wt 183.0 lb

## 2013-02-02 DIAGNOSIS — R209 Unspecified disturbances of skin sensation: Secondary | ICD-10-CM

## 2013-02-02 DIAGNOSIS — F411 Generalized anxiety disorder: Secondary | ICD-10-CM

## 2013-02-02 DIAGNOSIS — Z9889 Other specified postprocedural states: Secondary | ICD-10-CM

## 2013-02-02 DIAGNOSIS — M51379 Other intervertebral disc degeneration, lumbosacral region without mention of lumbar back pain or lower extremity pain: Secondary | ICD-10-CM | POA: Insufficient documentation

## 2013-02-02 DIAGNOSIS — M5137 Other intervertebral disc degeneration, lumbosacral region: Secondary | ICD-10-CM

## 2013-02-02 NOTE — Progress Notes (Signed)
[name] [address] [place]. [place] [address] [phone]       OFFICE CONSULT NOTE  Lauren Garrett Date of Birth:  1946-03-16 Medical Record Number:  [id number]   Referring MD:  Guerry Bruin  Reason for Referral:  Numbness on face  HPI: Lauren Garrett is a 51 year lady who has had intermittent transient tingling and numbness starting in left temple in July 2014. Few days later it spread to left jaw and few weeks later to left great toe and second toe.There are no triggers except perhaps stress. No relieving factors.Lasts from 20 minutes to few hours but are not bothersome.she denies ant neck or radicular pain or weakness. No headaches, loss of vision. She had MRI brain 11/19/12 which I have reviewed personally and show nonspecific periventricular, subcortical and brainstem white matter hyperintensities with a wide differential. 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  ROS:   14 system review of systems is positive for tingling,numbness,feeling hot,memory loss,confusion,anxiety,not enough sleep  PMH:  Past Medical History  Diagnosis Date  . Hypertension   . Thyroid disease   . Chronic pain   . Hypercholesteremia     Social History:  History   Social History  . Marital Status: Married    Spouse Name: N/A    Number of Children: 2  . Years of Education: master's    Occupational History  . Rtired     Social History Main Topics  . Smoking status: Never Smoker   . Smokeless tobacco: Never Used  . Alcohol Use: No  . Drug Use: No  . Sexual Activity: Yes   Other Topics Concern  . Not on file   Social History Narrative  . No narrative on  file    Medications:   Current Outpatient Prescriptions on File Prior to Visit  Medication Sig Dispense Refill  . aspirin EC 81 MG tablet Take 81 mg by mouth daily.      Marland Kitchen atorvastatin (LIPITOR) 20 MG tablet Take 20 mg by mouth daily.      . Cholecalciferol (VITAMIN D3) 2000 UNITS TABS Take 1 tablet by mouth daily.      . iron polysaccharides (NU-IRON) 150 MG capsule Take 150 mg by mouth 2 (two) times daily.      Marland Kitchen levothyroxine (SYNTHROID, LEVOTHROID) 125 MCG tablet Take 125 mcg by mouth daily.      . Multiple Vitamin (MULTIVITAMIN WITH MINERALS) TABS Take 1 tablet by mouth daily.      . Nutritional Supplements (ESTROVEN PO) Take 1 tablet by mouth daily.      Marland Kitchen nystatin-triamcinolone (MYCOLOG II) cream Apply 1 application topically 2 (two) times daily.      Marland Kitchen tiZANidine (ZANAFLEX) 4 MG tablet Take 2 mg by mouth daily.      Marland Kitchen triamterene-hydrochlorothiazide (MAXZIDE-25) 37.5-25 MG per tablet Take 1 tablet by mouth daily.       No current facility-administered medications on file prior to visit.    Allergies:   Allergies  Allergen Reactions  . Macrobid [Nitrofurantoin Monohydrate Macrocrystals] Itching and Rash    Physical Exam General: well developed, well nourished, middle aged  lady seated, in no evident distress Head: head normocephalic and atraumatic. Orohparynx benign Neck: supple with no carotid or supraclavicular bruits Cardiovascular: regular rate and rhythm, no murmurs Musculoskeletal: no deformity Skin:  no rash/petichiae. Surgical scar anterior neck from cspine surgery Vascular:  Normal pulses all extremities Filed Vitals:   02/02/13 1318  BP: 170/73  Pulse: 67    Neurologic Exam Mental Status: Awake and fully alert. Oriented to place and time. Recent and remote memory intact. Attention span, concentration and fund of knowledge appropriate. Mood and affect appropriate.  Cranial Nerves: Fundoscopic exam reveals sharp disc margins. Pupils equal, briskly reactive to  light. Extraocular movements full without nystagmus. Visual fields full to confrontation. Hearing intact. Facial sensation intact. Face, tongue, palate moves normally and symmetrically.  Motor: Normal bulk and tone. Normal strength in all tested extremity muscles. Sensory.: intact to touch and pinprick and vibratory sensation.  Coordination: Rapid alternating movements normal in all extremities. Finger-to-nose and heel-to-shin performed accurately bilaterally. Gait and Station: Arises from chair without difficulty. Stance is normal. Gait demonstrates normal stride length and balance . Able to heel, toe and tandem walk without difficulty.  Reflexes: 1+ and symmetric. Toes downgoing.   NIHSS  0 Modified Rankin  1   ASSESSMENT: 51 year Caucasian lady with intermittent left face, jaw and foot paresthesias  Since 3 months of unclear etiology.Normal neurological exam and MRI shows nonspecific white matter changes .Underlying anxiety/stress could be contributing    PLAN: Check MRI scan of the cervical spine with and without contrast for degenerative compressive spine disease as well as check Lyme titer, ANA panel and ESR. I advised the patient to participate in daily activities for stress laxation like meditation, yoga, exercise, swimming. Return for followup in 6 weeks or earlier if necessary

## 2013-02-02 NOTE — Patient Instructions (Signed)
Check MRI scan of the cervical spine with and without contrast for degenerative compressive spine disease as well as check Lyme titer, ANA panel and ESR. I advised the patient to participate in daily activities for stress laxation like meditation, yoga, exercise, swimming. Return for followup in 6 weeks or earlier if necessary

## 2013-02-03 LAB — ANA W/REFLEX: Anti Nuclear Antibody(ANA): NEGATIVE

## 2013-02-03 LAB — LYME, TOTAL AB TEST/REFLEX: Lyme IgG/IgM Ab: <0.91 {ISR} (ref 0.00–0.90)

## 2013-02-03 NOTE — Telephone Encounter (Signed)
Patient was having a hairline headache, took 200mg -excedrin extra strength tablet, calmed the headache,now just dull feeling. She would like to know if she could continue to take the aspirin if headache returns, usually does not have headaches

## 2013-02-03 NOTE — Telephone Encounter (Signed)
Yes but call us back in 2 days and give update

## 2013-02-03 NOTE — Telephone Encounter (Signed)
Shared information per Dr Pearlean Brownie with patient's husband, wife not available.He understood and would inform wife

## 2013-02-03 NOTE — Progress Notes (Signed)
Quick Note:  I called and LMVM for pt on cell # that results normal. Pt is to call back as needed. ______

## 2013-02-04 ENCOUNTER — Telehealth: Payer: Self-pay | Admitting: Neurology

## 2013-02-04 NOTE — Telephone Encounter (Signed)
I spoke to patient and answered questions

## 2013-02-04 NOTE — Telephone Encounter (Signed)
Called to get questions for doctor, left VM for call back

## 2013-02-18 ENCOUNTER — Telehealth: Payer: Self-pay | Admitting: Neurology

## 2013-02-21 NOTE — Telephone Encounter (Signed)
Headaches started back on Friday on left side of head and while having headaches B/P is elevated, feels  as though something was under her skull. She is better today and was good all weekend. She wants to know what is safe to take and could this be coming from a nerve root injection recently had on 09/11, or could this be her sinuses?

## 2013-02-25 ENCOUNTER — Telehealth: Payer: Self-pay | Admitting: *Deleted

## 2013-02-25 NOTE — Telephone Encounter (Signed)
Called patient to inform her Dr. Pearlean Brownie did not think her headaches came from her nerve root injection. And he will prescribe low dose Topamax 25 MG twice daily. Patient was unable to come to the phone, her husband states he will give me a call back.

## 2013-02-28 ENCOUNTER — Telehealth: Payer: Self-pay | Admitting: Neurology

## 2013-02-28 NOTE — Telephone Encounter (Signed)
topamax refill

## 2013-02-28 NOTE — Telephone Encounter (Addendum)
Called patient and gave message from Dr Pearlean Brownie per Jones Broom, she understood and said that she will call back if further questions

## 2013-03-01 ENCOUNTER — Other Ambulatory Visit: Payer: Self-pay | Admitting: Neurology

## 2013-03-01 DIAGNOSIS — R200 Anesthesia of skin: Secondary | ICD-10-CM

## 2013-03-01 MED ORDER — TOPIRAMATE 25 MG PO TABS: 25.0000 mg | ORAL_TABLET | ORAL | Status: DC | PRN

## 2013-03-22 ENCOUNTER — Ambulatory Visit: Payer: Medicare Other | Admitting: Neurology

## 2013-03-23 ENCOUNTER — Ambulatory Visit (INDEPENDENT_AMBULATORY_CARE_PROVIDER_SITE_OTHER): Payer: Medicare Other | Admitting: Neurology

## 2013-03-23 ENCOUNTER — Encounter: Payer: Self-pay | Admitting: Neurology

## 2013-03-23 VITALS — BP 185/75 | HR 63 | Temp 99.0°F | Ht 68.5 in | Wt 183.0 lb

## 2013-03-23 DIAGNOSIS — G44209 Tension-type headache, unspecified, not intractable: Secondary | ICD-10-CM

## 2013-03-23 DIAGNOSIS — R51 Headache: Secondary | ICD-10-CM

## 2013-03-23 NOTE — Patient Instructions (Signed)
I have advised her to increase participation in stress relaxation activities like regular exercise, swimming, meditation and yoga. The patient continues to refuse trial of anxiolytic agents. She will keep her appointment for MRI scan of the cervical spine next week. Return for followup in the future only as necessary.

## 2013-03-23 NOTE — Progress Notes (Signed)
[name] [address] [place]. [place] [address] [phone]       OFFICE CONSULT NOTE  Ms. DECLYNN LOPRESTI Date of Birth:  03-08-1946 Medical Record Number:  [id number]   Referring MD:  Guerry Bruin  Reason for Referral:  Numbness on face  HPI: Ms Fargnoli is a 31 year lady who has had intermittent transient tingling and numbness starting in left temple in July 2014. Few days later it spread to left jaw and few weeks later to left great toe and second toe.There are no triggers except perhaps stress. No relieving factors.Lasts from 20 minutes to few hours but are not bothersome.she denies ant neck or radicular pain or weakness. No headaches, loss of vision. She had MRI brain 11/19/12 which I have reviewed personally and show nonspecific periventricular, subcortical and brainstem white matter hyperintensities with a wide differential. 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 Update 03/23/13 : She returns for followup of the last was a 02/02/13. She has noticed some improvement in her intermittent left face numbness which she still gets off and on when she is stressed out. She complains of new left frontal headaches and has had 5 episodes since last visit. She describes them as moderate in severity muscle pulling sensation not accompanied by nausea and vomiting light or sound sensitivity. The headache seems to be increased by stress or exertion. She has found that taking Zanaflex helps the headache though 4 mg makes her feel groggy. She has tried to do some activities for stress vaccination intermittently which seem to help her but she cannot do them regularly. She did not  undergo MRI scan of the neck has she saw Dr. Mikal Plane who suggested that it be ordered as part of her Worker's Compensation and it is scheduled for next Friday. Lab work done on 02/02/13 show normal ESR and ANA panel. Lyme antibody was negative. ROS:   14 system review of systems is positive for joint pain, aching muscles, headache, numbness, stiffness, not enough sleep and all the other systems is negative PMH:  Past Medical History  Diagnosis Date  . Hypertension   . Thyroid disease   . Chronic pain   . Hypercholesteremia     Social History:  History   Social History  . Marital Status: Married    Spouse Name: N/A    Number of Children: 2  . Years of Education: master's    Occupational History  . Rtired     Social History Main Topics  . Smoking status: Never Smoker   . Smokeless tobacco: Never Used  . Alcohol Use: No  . Drug Use: No  . Sexual Activity: Yes   Other Topics Concern  . Not on file   Social History Narrative  . No narrative on file    Medications:   Current Outpatient Prescriptions on File Prior to Visit  Medication Sig Dispense Refill  . aspirin EC 81 MG tablet Take 81 mg by mouth daily.      Marland Kitchen atorvastatin (LIPITOR) 20 MG tablet Take 20 mg by mouth daily.      Marland Kitchen BLACK COHOSH EXTRACT PO Take by mouth  daily.      . Cholecalciferol (VITAMIN D3) 2000 UNITS TABS Take 1 tablet by mouth daily.      . diclofenac (VOLTAREN) 75 MG EC tablet Take 75 mg by mouth 2 (two) times daily.      . Hydrocodone-Acetaminophen 5-300 MG TABS Take 5-300 mg by mouth as needed.      . iron polysaccharides (NU-IRON) 150 MG capsule Take 150 mg by mouth 2 (two) times daily.      Marland Kitchen levothyroxine (SYNTHROID, LEVOTHROID) 125 MCG tablet Take 125 mcg by mouth daily.      . metoprolol succinate (TOPROL-XL) 25 MG 24 hr tablet Take 25 mg by mouth 2 (two) times daily.      . Multiple Vitamin (MULTIVITAMIN WITH MINERALS) TABS Take 1 tablet by mouth daily.      . Nutritional Supplements  (ESTROVEN PO) Take 1 tablet by mouth daily.      Marland Kitchen nystatin-triamcinolone (MYCOLOG II) cream Apply 1 application topically 2 (two) times daily.      . Omega-3 Fatty Acids (FISH OIL) 1200 MG CAPS Take by mouth daily.      Marland Kitchen tiZANidine (ZANAFLEX) 4 MG tablet Take 2 mg by mouth daily.      Marland Kitchen topiramate (TOPAMAX) 25 MG tablet Take 1 tablet (25 mg total) by mouth as needed.  50 tablet  0  . triamterene-hydrochlorothiazide (MAXZIDE-25) 37.5-25 MG per tablet Take 1 tablet by mouth daily.       No current facility-administered medications on file prior to visit.    Allergies:   Allergies  Allergen Reactions  . Macrobid [Nitrofurantoin Monohydrate Macrocrystals] Itching and Rash    Physical Exam General: well developed, well nourished, middle aged lady seated, in no evident distress Head: head normocephalic and atraumatic. Orohparynx benign Neck: supple with no carotid or supraclavicular bruits Cardiovascular: regular rate and rhythm, no murmurs Musculoskeletal: no deformity Skin:  no rash/petichiae. Surgical scar anterior neck from cspine surgery Vascular:  Normal pulses all extremities Filed Vitals:   03/23/13 1440  BP: 185/75  Pulse: 63  Temp: 99 F (37.2 C)    Neurologic Exam Mental Status: Awake and fully alert. Oriented to place and time. Recent and remote memory intact. Attention span, concentration and fund of knowledge appropriate. Mood and affect appropriate.  Cranial Nerves: Fundoscopic exam not done  . Pupils equal, briskly reactive to light. Extraocular movements full without nystagmus. Visual fields full to confrontation. Hearing intact. Facial sensation intact. Face, tongue, palate moves normally and symmetrically.  Motor: Normal bulk and tone. Normal strength in all tested extremity muscles. Sensory.: intact to touch and pinprick and vibratory sensation.  Coordination: Rapid alternating movements normal in all extremities. Finger-to-nose and heel-to-shin performed  accurately bilaterally. Gait and Station: Arises from chair without difficulty. Stance is normal. Gait demonstrates normal stride length and balance . Able to heel, toe and tandem walk without difficulty.  Reflexes: 1+ and symmetric. Toes downgoing.    ASSESSMENT: 78 year Caucasian lady with intermittent left face, jaw and foot paresthesias since 3 months of unclear etiology.Normal neurological exam and MRI shows nonspecific white matter changes .Underlying anxiety/stress could be contributing. New complaints of intermittent frontal headaches likely muscle tension headaches.    PLAN: I have advised her to increase participation in stress relaxation activities like regular exercise, swimming, meditation and yoga. I spent 20 minutes counseling the patient about her significant anxiety which seems to be mainly centered around taking care of her his mother. She is refusing a trial of  anxiolytic medications but is willing to participate in stress relaxation activities. The patient continues to refuse trial of anxiolytic agents. Continue Zanaflex 4 tension headaches but reduce the dose to 2 mg due to cognitive side effects She will keep her appointment for MRI scan of the cervical spine next week. Return for followup in the future only as necessary.

## 2013-04-18 ENCOUNTER — Other Ambulatory Visit: Payer: Self-pay | Admitting: Neurology

## 2013-05-17 ENCOUNTER — Encounter: Payer: Self-pay | Admitting: Neurology

## 2013-05-17 ENCOUNTER — Ambulatory Visit (INDEPENDENT_AMBULATORY_CARE_PROVIDER_SITE_OTHER): Payer: Medicare Other | Admitting: Neurology

## 2013-05-17 ENCOUNTER — Encounter (INDEPENDENT_AMBULATORY_CARE_PROVIDER_SITE_OTHER): Payer: Self-pay

## 2013-05-17 VITALS — BP 133/69 | HR 61 | Temp 98.1°F | Ht 67.0 in | Wt 182.0 lb

## 2013-05-17 DIAGNOSIS — G44209 Tension-type headache, unspecified, not intractable: Secondary | ICD-10-CM

## 2013-05-17 NOTE — Patient Instructions (Addendum)
I had a long discussion the patient and husband regarding her tension headaches discuss the need for regular participation in activities for stress relaxation and answered questions. Continue Zanaflex for headaches and discontinue Topamax as it is not helping her. Return for followup in the future only as necessary

## 2013-05-17 NOTE — Progress Notes (Signed)
[name] [address] [place]. [place] [address] [phone]       OFFICE CONSULT NOTE  Lauren. Lauren Garrett Date of Birth:  02/27/1946 Medical Record Number:  [id number]   Referring MD:  Domenick Gong  Reason for Referral:  Numbness on face  HPI: Lauren Garrett is a 22 year lady who has had intermittent transient tingling and numbness starting in left temple in July 2014. Few days later it spread to left jaw and few weeks later to left great toe and second toe.There are no triggers except perhaps stress. No relieving factors.Lasts from 20 minutes to few hours but are not bothersome.she denies ant neck or radicular pain or weakness. No headaches, loss of vision. She had MRI brain 11/19/12 which I have reviewed personally and show nonspecific periventricular, subcortical and brainstem white matter hyperintensities with a wide differential. 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 Update 05/17/13 she returns for followup of the last visit on 03/23/13. She states her headaches better but they're still present. Headaches are present mainly at night and can be brought on by exposure to cold or with stress. She does admit that she has been under significant stress since her mother has been sick and recently she was hospitalized for her infection and is currently in a nursing home. She states that it she takes Topamax only occasionally but it does not seem to work. Zanaflex seems to help better but it mostly puts her to sleep she feels better. She did undergo MRI scan of the neck on 03/23/13 which I cannot find the report for but shows stable postop changes according to my  interpretation. The patient did see Dr. Cyndy Freeze who told her that she did not need any further surgery. She complains of occasionally feeling mental fog but otherwise has no new complaints. Update 03/23/13 : She returns for followup of the last was a 02/02/13. She has noticed some improvement in her intermittent left face numbness which she still gets off and on when she is stressed out. She complains of new left frontal headaches and has had 5 episodes since last visit. She describes them as moderate in severity muscle pulling sensation not accompanied by nausea and vomiting light or sound sensitivity. The headache seems to be increased by stress or exertion. She has found that taking Zanaflex helps the headache though 4 mg makes her feel groggy. She has tried to do some activities for stress vaccination intermittently which seem to help her but she cannot do them regularly. She did not undergo MRI scan of the neck has she saw Dr. Cyndy Freeze who suggested that it be ordered as part of her Worker's Compensation and it is scheduled for next Friday. Lab work done on 02/02/13 show normal ESR and ANA panel. Lyme antibody was negative. ROS:   14 system review of systems is positive for neck pain, headache, back pain, walking difficulty, numbness, mental 4 and all the systems negative PMH:  Past Medical History  Diagnosis Date  . Hypertension   . Thyroid disease   . Chronic pain   . Hypercholesteremia     Social History:  History  Social History  . Marital Status: Married    Spouse Name: [name]    Number of Children: 2  . Years of Education: master's    Occupational History  . Rtired     Social History Main Topics  . Smoking status: Never Smoker   . Smokeless tobacco: Never Used  . Alcohol Use: No  . Drug Use: No  . Sexual Activity: Yes   Other Topics Concern  . Not on file   Social History Narrative   Patient lives at home spouse.   Caffeine Use: rarely    Medications:   Current  Outpatient Prescriptions on File Prior to Visit  Medication Sig Dispense Refill  . aspirin EC 81 MG tablet Take 81 mg by mouth daily.      Marland Kitchen atorvastatin (LIPITOR) 20 MG tablet Take 20 mg by mouth daily.      Marland Kitchen BLACK COHOSH EXTRACT PO Take by mouth daily.      . Cholecalciferol (VITAMIN D3) 2000 UNITS TABS Take 1 tablet by mouth daily.      . diclofenac (VOLTAREN) 75 MG EC tablet Take 75 mg by mouth 2 (two) times daily.      . Hydrocodone-Acetaminophen 5-300 MG TABS Take 5-300 mg by mouth as needed.      . iron polysaccharides (NU-IRON) 150 MG capsule Take 150 mg by mouth 2 (two) times daily.      Marland Kitchen levothyroxine (SYNTHROID, LEVOTHROID) 125 MCG tablet Take 125 mcg by mouth daily.      . metoprolol succinate (TOPROL-XL) 25 MG 24 hr tablet Take 25 mg by mouth 2 (two) times daily.      . Multiple Vitamin (MULTIVITAMIN WITH MINERALS) TABS Take 1 tablet by mouth daily.      . Nutritional Supplements (ESTROVEN PO) Take 1 tablet by mouth daily.      Marland Kitchen nystatin-triamcinolone (MYCOLOG II) cream Apply 1 application topically 2 (two) times daily as needed.       . Omega-3 Fatty Acids (FISH OIL) 1200 MG CAPS Take by mouth daily.      Marland Kitchen tiZANidine (ZANAFLEX) 4 MG tablet Take 2 mg by mouth daily.      Marland Kitchen topiramate (TOPAMAX) 25 MG tablet Take 1 tablet (25 mg total) by mouth 2 (two) times daily.  60 tablet  3  . triamterene-hydrochlorothiazide (MAXZIDE-25) 37.5-25 MG per tablet Take 1 tablet by mouth daily.       No current facility-administered medications on file prior to visit.    Allergies:   Allergies  Allergen Reactions  . Macrobid [Nitrofurantoin Monohydrate Macrocrystals] Itching and Rash    Physical Exam General: well developed, well nourished, middle aged lady seated, in no evident distress Head: head normocephalic and atraumatic. Orohparynx benign Neck: supple with no carotid or supraclavicular bruits Cardiovascular: regular rate and rhythm, no murmurs Musculoskeletal: no deformity Skin:   no rash/petichiae. Surgical scar anterior neck from cspine surgery Vascular:  Normal pulses all extremities Filed Vitals:   05/17/13 1456  BP: 133/69  Pulse: 61  Temp: 98.1 F (36.7 C)    Neurologic Exam Mental Status: Awake and fully alert. Oriented to place and time. Recent and remote memory intact. Attention span, concentration and fund of knowledge appropriate. Mood and affect appropriate.  Cranial Nerves: Fundoscopic exam not done  . Pupils equal, briskly reactive to light. Extraocular movements full without nystagmus. Visual fields full to confrontation. Hearing intact. Facial sensation intact. Face, tongue, palate moves normally and symmetrically.  Motor: Normal bulk and tone.  Normal strength in all tested extremity muscles. Sensory.: intact to touch and pinprick and vibratory sensation.  Coordination: Rapid alternating movements normal in all extremities. Finger-to-nose and heel-to-shin performed accurately bilaterally. Gait and Station: Arises from chair without difficulty. Stance is normal. Gait demonstrates normal stride length and balance . Able to heel, toe and tandem walk without difficulty.  Reflexes: 1+ and symmetric. Toes downgoing.    ASSESSMENT: 32 year Caucasian lady with intermittent left face, jaw and foot paresthesias since 3 months of unclear etiology.Normal neurological exam and MRI shows nonspecific white matter changes .Underlying anxiety/stress could be contributing. New complaints of intermittent frontal headaches likely muscle tension headaches.    PLAN: I have advised her to increase participation in stress relaxation activities like regular exercise, swimming, meditation and yoga. I spent 20 minutes counseling the patient about her significant anxiety which seems to be mainly centered around taking care of her his mother. She is refusing a trial of anxiolytic medications but is willing to participate in stress relaxation activities. The patient continues to  refuse trial of anxiolytic agents. Continue Zanaflex 4 tension headaches but reduce the dose to 2 mg due to cognitive side effects .I had a long discussion the patient and husband regarding her tension headaches discuss the need for regular participation in activities for stress relaxation and answered questions. Continue Zanaflex for headaches and discontinue Topamax as it is not helping her. Return for followup in the future only as necessary

## 2013-08-02 ENCOUNTER — Other Ambulatory Visit: Payer: Self-pay | Admitting: Neurosurgery

## 2013-08-02 DIAGNOSIS — M48061 Spinal stenosis, lumbar region without neurogenic claudication: Secondary | ICD-10-CM

## 2013-09-14 ENCOUNTER — Ambulatory Visit
Admission: RE | Admit: 2013-09-14 | Discharge: 2013-09-14 | Disposition: A | Discharge status: Home / Self Care | Payer: Self-pay | Source: Ambulatory Visit | Attending: Neurosurgery | Admitting: Neurosurgery

## 2013-09-14 VITALS — BP 189/84 | HR 69

## 2013-09-14 DIAGNOSIS — M48061 Spinal stenosis, lumbar region without neurogenic claudication: Secondary | ICD-10-CM

## 2013-09-14 MED ORDER — METHYLPREDNISOLONE ACETATE 40 MG/ML INJ SUSP (RADIOLOG
120.0000 mg | Freq: Once | INTRAMUSCULAR | Status: AC
  Administered 2013-09-14: 120 mg via EPIDURAL

## 2013-09-14 MED ORDER — IOHEXOL 180 MG/ML  SOLN
1.0000 mL | Freq: Once | INTRAMUSCULAR | Status: AC | PRN
  Administered 2013-09-14: 1 mL via EPIDURAL

## 2013-09-14 NOTE — Discharge Instructions (Signed)

## 2013-11-03 ENCOUNTER — Other Ambulatory Visit: Payer: Self-pay

## 2013-11-03 DIAGNOSIS — Z1231 Encounter for screening mammogram for malignant neoplasm of breast: Secondary | ICD-10-CM

## 2013-11-08 ENCOUNTER — Ambulatory Visit
Admission: RE | Admit: 2013-11-08 | Discharge: 2013-11-08 | Disposition: A | Discharge status: Home / Self Care | Payer: Medicare Other | Source: Ambulatory Visit

## 2013-11-08 ENCOUNTER — Encounter (INDEPENDENT_AMBULATORY_CARE_PROVIDER_SITE_OTHER): Payer: Self-pay

## 2013-11-08 DIAGNOSIS — Z1231 Encounter for screening mammogram for malignant neoplasm of breast: Secondary | ICD-10-CM

## 2014-04-17 ENCOUNTER — Other Ambulatory Visit: Payer: Self-pay | Admitting: Neurosurgery

## 2014-04-17 DIAGNOSIS — M48061 Spinal stenosis, lumbar region without neurogenic claudication: Secondary | ICD-10-CM

## 2014-06-15 ENCOUNTER — Other Ambulatory Visit: Payer: Self-pay | Admitting: Neurosurgery

## 2014-06-15 DIAGNOSIS — M4316 Spondylolisthesis, lumbar region: Secondary | ICD-10-CM

## 2014-06-30 ENCOUNTER — Ambulatory Visit
Admission: RE | Admit: 2014-06-30 | Discharge: 2014-06-30 | Disposition: A | Discharge status: Home / Self Care | Payer: Worker's Compensation | Source: Ambulatory Visit | Attending: Neurosurgery | Admitting: Neurosurgery

## 2014-06-30 DIAGNOSIS — M4316 Spondylolisthesis, lumbar region: Secondary | ICD-10-CM

## 2014-06-30 MED ORDER — IOHEXOL 180 MG/ML  SOLN
1.0000 mL | Freq: Once | INTRAMUSCULAR | Status: AC | PRN
  Administered 2014-06-30: 1 mL via EPIDURAL

## 2014-06-30 MED ORDER — METHYLPREDNISOLONE ACETATE 40 MG/ML INJ SUSP (RADIOLOG
120.0000 mg | Freq: Once | INTRAMUSCULAR | Status: AC
  Administered 2014-06-30: 120 mg via EPIDURAL

## 2014-06-30 NOTE — Discharge Instructions (Signed)

## 2014-10-20 ENCOUNTER — Encounter (HOSPITAL_COMMUNITY): Payer: Self-pay | Admitting: Emergency Medicine

## 2014-10-20 ENCOUNTER — Emergency Department (HOSPITAL_COMMUNITY): Payer: Medicare Other

## 2014-10-20 ENCOUNTER — Emergency Department (HOSPITAL_COMMUNITY)
Admission: EM | Admit: 2014-10-20 | Discharge: 2014-10-20 | Disposition: A | Discharge status: Home / Self Care | Payer: Medicare Other | Attending: Emergency Medicine | Admitting: Emergency Medicine

## 2014-10-20 DIAGNOSIS — Y929 Unspecified place or not applicable: Secondary | ICD-10-CM | POA: Insufficient documentation

## 2014-10-20 DIAGNOSIS — S99912A Unspecified injury of left ankle, initial encounter: Secondary | ICD-10-CM | POA: Insufficient documentation

## 2014-10-20 DIAGNOSIS — Z79899 Other long term (current) drug therapy: Secondary | ICD-10-CM | POA: Diagnosis not present

## 2014-10-20 DIAGNOSIS — I10 Essential (primary) hypertension: Secondary | ICD-10-CM | POA: Diagnosis not present

## 2014-10-20 DIAGNOSIS — X58XXXA Exposure to other specified factors, initial encounter: Secondary | ICD-10-CM | POA: Diagnosis not present

## 2014-10-20 DIAGNOSIS — E780 Pure hypercholesterolemia: Secondary | ICD-10-CM | POA: Diagnosis not present

## 2014-10-20 DIAGNOSIS — S9032XA Contusion of left foot, initial encounter: Secondary | ICD-10-CM | POA: Diagnosis not present

## 2014-10-20 DIAGNOSIS — E079 Disorder of thyroid, unspecified: Secondary | ICD-10-CM | POA: Diagnosis not present

## 2014-10-20 DIAGNOSIS — G8929 Other chronic pain: Secondary | ICD-10-CM | POA: Diagnosis not present

## 2014-10-20 DIAGNOSIS — Z7982 Long term (current) use of aspirin: Secondary | ICD-10-CM | POA: Insufficient documentation

## 2014-10-20 DIAGNOSIS — Y999 Unspecified external cause status: Secondary | ICD-10-CM | POA: Insufficient documentation

## 2014-10-20 DIAGNOSIS — S92502A Displaced unspecified fracture of left lesser toe(s), initial encounter for closed fracture: Secondary | ICD-10-CM

## 2014-10-20 DIAGNOSIS — S99922A Unspecified injury of left foot, initial encounter: Secondary | ICD-10-CM | POA: Diagnosis present

## 2014-10-20 DIAGNOSIS — S90122A Contusion of left lesser toe(s) without damage to nail, initial encounter: Secondary | ICD-10-CM | POA: Diagnosis not present

## 2014-10-20 DIAGNOSIS — Y939 Activity, unspecified: Secondary | ICD-10-CM | POA: Insufficient documentation

## 2014-10-20 DIAGNOSIS — S92512A Displaced fracture of proximal phalanx of left lesser toe(s), initial encounter for closed fracture: Secondary | ICD-10-CM | POA: Insufficient documentation

## 2014-10-20 DIAGNOSIS — M25572 Pain in left ankle and joints of left foot: Secondary | ICD-10-CM

## 2014-10-20 MED ORDER — OXYCODONE-ACETAMINOPHEN 5-325 MG PO TABS
1.0000 | ORAL_TABLET | Freq: Once | ORAL | Status: AC
  Administered 2014-10-20: 1 via ORAL
  Filled 2014-10-20: qty 1

## 2014-10-20 MED ORDER — HYDROCODONE-ACETAMINOPHEN 5-325 MG PO TABS: 1.0000 | ORAL_TABLET | Freq: Four times a day (QID) | ORAL | Status: DC | PRN

## 2014-10-20 NOTE — ED Provider Notes (Signed)
CSN: [id number]     Arrival date & time 10/20/14  1939 History   This chart was scribed for Al Corpus, PA-C working with Alfonzo Beers, MD by Mercy Moore, ED Scribe. This patient was seen in room TR05C/TR05C and the patient's care was started at 8:32 PM.   Chief Complaint  Patient presents with  . Ankle Pain  . Foot Pain   The history is provided by the patient. No language interpreter was used.   HPI Comments: Lauren Garrett is a 69 y.o. female who presents to the Emergency Department complaining of painful, firm mass to dorsum of left foot onset three days ago. Patient informed her PCP of complaint during her annual physical. The following day, patient reports pain, bruising, discoloration and tingling primarily to left second digit. Patient denies known injury as causation. Patient with history of torn ligament in 2004 and chronic history of pain in her ankle.  Patient takes aspirin 81 mg daily.    Past Medical History  Diagnosis Date  . Hypertension   . Thyroid disease   . Chronic pain   . Hypercholesteremia    Past Surgical History  Procedure Laterality Date  . Neck surgery      [id number]   Family History  Problem Relation Age of Onset  . Dementia Mother   . Lung cancer Father    History  Substance Use Topics  . Smoking status: Never Smoker   . Smokeless tobacco: Never Used  . Alcohol Use: No   OB History    No data available     Review of Systems  Constitutional: Negative for fever and chills.  Musculoskeletal: Positive for joint swelling and arthralgias.  Skin: Positive for color change.       Bruising   Neurological: Negative for weakness and numbness.      Allergies  Macrobid  Home Medications   Prior to Admission medications   Medication Sig Start Date End Date Taking? Authorizing Provider  aspirin EC 81 MG tablet Take 81 mg by mouth daily.    Historical Provider, MD  atorvastatin (LIPITOR) 20 MG tablet Take 20 mg by mouth daily.     Historical Provider, MD  BLACK COHOSH EXTRACT PO Take by mouth daily.    Historical Provider, MD  Cholecalciferol (VITAMIN D3) 2000 UNITS TABS Take 1 tablet by mouth daily.    Historical Provider, MD  diclofenac (VOLTAREN) 75 MG EC tablet Take 75 mg by mouth 2 (two) times daily.    Historical Provider, MD  HYDROcodone-acetaminophen (NORCO/VICODIN) 5-325 MG per tablet Take 1 tablet by mouth every 6 (six) hours as needed. 10/20/14   Al Corpus, PA-C  Hydrocodone-Acetaminophen 5-300 MG TABS Take 5-300 mg by mouth as needed.    Historical Provider, MD  iron polysaccharides (NU-IRON) 150 MG capsule Take 150 mg by mouth 2 (two) times daily.    Historical Provider, MD  levothyroxine (SYNTHROID, LEVOTHROID) 125 MCG tablet Take 125 mcg by mouth daily.    Historical Provider, MD  metoprolol succinate (TOPROL-XL) 25 MG 24 hr tablet Take 25 mg by mouth 2 (two) times daily.    Historical Provider, MD  Multiple Vitamin (MULTIVITAMIN WITH MINERALS) TABS Take 1 tablet by mouth daily.    Historical Provider, MD  Nutritional Supplements (ESTROVEN PO) Take 1 tablet by mouth daily.    Historical Provider, MD  nystatin-triamcinolone (MYCOLOG II) cream Apply 1 application topically 2 (two) times daily as needed.     Historical Provider, MD  Omega-3 Fatty  Acids (FISH OIL) 1200 MG CAPS Take by mouth daily.    Historical Provider, MD  tiZANidine (ZANAFLEX) 4 MG tablet Take 2 mg by mouth daily.    Historical Provider, MD  topiramate (TOPAMAX) 25 MG tablet Take 1 tablet (25 mg total) by mouth 2 (two) times daily. 04/18/13   [name] Fila, MD  triamterene-hydrochlorothiazide (MAXZIDE-25) 37.5-25 MG per tablet Take 1 tablet by mouth daily.    Historical Provider, MD   Triage Vitals: BP 199/64 mmHg  Pulse 82  Temp(Src) 98.1 F (36.7 C) (Oral)  Resp 18  Ht 5\' 7"  (1.702 m)  SpO2 100% Physical Exam  Constitutional: She appears well-developed and well-nourished. No distress.  HENT:  Head: Normocephalic and atraumatic.   Eyes: Conjunctivae are normal. Right eye exhibits no discharge. Left eye exhibits no discharge.  Pulmonary/Chest: Effort normal. No respiratory distress.  Musculoskeletal:  2+ PT and DP pulses bilaterally. Ecchymosis to left foot. Dorsally to 2nd, 3rd, 4th toes and ecchymosis distal to left lateral malleolus with edema. No bony tenderness. No proximal fibula tenderness. Full ROM of ankle.   Neurological: She is alert. Coordination normal.  5/5 strength in left lower extremity. Sensation intact.  Skin: She is not diaphoretic.  Psychiatric: She has a normal mood and affect. Her behavior is normal.  Nursing note and vitals reviewed.   ED Course  Procedures (including critical care time)  COORDINATION OF CARE: 8:43 PM- Discussed treatment plan with patient at bedside and patient agreed to plan.   Labs Review Labs Reviewed - No data to display  Imaging Review Dg Ankle Complete Left  10/20/2014   CLINICAL DATA:  Pain and bruising on anterior surface of foot extending to second toe with numbness in toe. No injury. Possible bite.  EXAM: LEFT ANKLE COMPLETE - 3+ VIEW  COMPARISON:  Foot films of same date and 01/05/2013.  FINDINGS: Mild diffuse soft tissue swelling. No acute fracture or dislocation. Small Achilles and calcaneal spurs. Favor artifact over radiopaque foreign body projecting adjacent the lateral malleolus on the AP and mortise views. No soft tissue gas.  IMPRESSION: Soft tissue swelling, without acute osseous abnormality.  Favor artifact over tiny radiopaque foreign body adjacent the distal fibula on the AP and mortise views. Not localized on the lateral view.   Electronically Signed   By: Abigail Miyamoto M.D.   On: 10/20/2014 20:34   Dg Foot Complete Left  10/20/2014   CLINICAL DATA:  Acute onset of pain and bruising along the dorsum of the left foot, extending along the second toe. Toe numbness. Initial encounter.  EXAM: LEFT FOOT - COMPLETE 3+ VIEW  COMPARISON:  None.  FINDINGS: There  appears to be a small avulsion fracture arising at the lateral aspect of the base of the second proximal phalanx, likely extending to the joint space. Overlying soft tissue swelling is noted.  There is no additional evidence of fracture. Small plantar and posterior calcaneal spurs are seen.  IMPRESSION: Small avulsion fracture arising at the lateral aspect of the base of the second proximal phalanx, likely extending to the joint space.   Electronically Signed   By: Garald Balding M.D.   On: 10/20/2014 20:34     EKG Interpretation None      Meds given in ED:  Medications  oxyCODONE-acetaminophen (PERCOCET/ROXICET) 5-325 MG per tablet 1 tablet (1 tablet Oral Given 10/20/14 2135)    New Prescriptions   HYDROCODONE-ACETAMINOPHEN (NORCO/VICODIN) 5-325 MG PER TABLET    Take 1 tablet by mouth  every 6 (six) hours as needed.      MDM   Final diagnoses:  Left ankle pain  Fracture of second toe, left, closed, initial encounter   Patient with fracture to lateral aspect of left second toe. VSS. Neurovascularly intact. No evidence of infection. Patient placed in surgical boot and toes buddy taped. Patient to follow-up with her orthopedist in one week. Discussed RICE protocol. Script for norco. Driving and sedation precautions provided.  Discussed return precautions with patient. Discussed all results and patient verbalizes understanding and agrees with plan.  I personally performed the services described in this documentation, which was scribed in my presence. The recorded information has been reviewed and is accurate.   Al Corpus, PA-C 10/20/14 [place], MD 10/20/14 2222

## 2014-10-20 NOTE — Discharge Instructions (Signed)
Return to the emergency room with worsening of symptoms, new symptoms or with symptoms that are concerning, especially fevers, redness, swelling, unable to move toes. Follow up with your orthopedist in one week. Buddy tape your toes together and wear hard soled shoe. RICE: Rest, Ice (three cycles of 20 mins on, 46mins off at least twice a day), compression/brace, elevation.  Ibuprofen 400mg  (2 tablets 200mg ) every 5-6 hours for 3-5 days. Norco for severe pain. Do not operate machinery, drive or drink alcohol while taking narcotics or muscle relaxers. Read below information and follow recommendations. Toe Fracture Your caregiver has diagnosed you as having a fractured toe. A toe fracture is a break in the bone of a toe. "Buddy taping" is a way of splinting your broken toe, by taping the broken toe to the toe next to it. This "buddy taping" will keep the injured toe from moving beyond normal range of motion. Buddy taping also helps the toe heal in a more normal alignment. It may take 6 to 8 weeks for the toe injury to heal. [place] your toes taped together for as long as directed by your caregiver or until you see a doctor for a follow-up examination. You can change the tape after bathing. Always use a small piece of gauze or cotton between the toes when taping them together. This will help the skin stay dry and prevent infection.  Apply ice to the injury for 15-20 minutes each hour while awake for the first 2 days. Put the ice in a plastic bag and place a towel between the bag of ice and your skin.  After the first 2 days, apply heat to the injured area. Use heat for the next 2 to 3 days. Place a heating pad on the foot or soak the foot in warm water as directed by your caregiver.  Keep your foot elevated as much as possible to lessen swelling.  Wear sturdy, supportive shoes. The shoes should not pinch the toes or fit tightly against the toes.  Your caregiver may prescribe  a rigid shoe if your foot is very swollen.  Your may be given crutches if the pain is too great and it hurts too much to walk.  Only take over-the-counter or prescription medicines for pain, discomfort, or fever as directed by your caregiver.  If your caregiver has given you a follow-up appointment, it is very important to keep that appointment. Not keeping the appointment could result in a chronic or permanent injury, pain, and disability. If there is any problem keeping the appointment, you must call back to this facility for assistance. SEEK MEDICAL CARE IF:   You have increased pain or swelling, not relieved with medications.  The pain does not get better after 1 week.  Your injured toe is cold when the others are warm. SEEK IMMEDIATE MEDICAL CARE IF:   The toe becomes cold, numb, or white.  The toe becomes hot (inflamed) and red. Document Released: 04/04/2000 Document Revised: 06/30/2011 Document Reviewed: 11/22/2007 [facility] Patient Information 2015 [place], [place]. This information is not intended to replace advice given to you by your health care provider. Make sure you discuss any questions you have with your health care provider.

## 2014-10-20 NOTE — ED Notes (Signed)
Pt has swollen area on top of left foot, bruising on toes, denies any injury that she remembers. 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

## 2014-10-20 NOTE — ED Notes (Signed)
Pt. reports left ankle/foot pain and swelling onset this week , denies recent fall or injury.

## 2014-10-31 ENCOUNTER — Encounter: Payer: Self-pay | Admitting: Gastroenterology

## 2015-03-12 ENCOUNTER — Other Ambulatory Visit: Payer: Self-pay

## 2015-03-12 DIAGNOSIS — Z1231 Encounter for screening mammogram for malignant neoplasm of breast: Secondary | ICD-10-CM

## 2015-03-14 ENCOUNTER — Ambulatory Visit: Payer: Self-pay

## 2015-05-02 ENCOUNTER — Ambulatory Visit
Admission: RE | Admit: 2015-05-02 | Discharge: 2015-05-02 | Disposition: A | Discharge status: Home / Self Care | Payer: Medicare Other | Source: Ambulatory Visit

## 2015-05-02 DIAGNOSIS — Z1231 Encounter for screening mammogram for malignant neoplasm of breast: Secondary | ICD-10-CM

## 2015-05-09 ENCOUNTER — Encounter: Payer: Medicare Other | Admitting: Neurology

## 2015-09-17 ENCOUNTER — Emergency Department (HOSPITAL_BASED_OUTPATIENT_CLINIC_OR_DEPARTMENT_OTHER): Payer: Medicare Other

## 2015-09-17 ENCOUNTER — Emergency Department (HOSPITAL_BASED_OUTPATIENT_CLINIC_OR_DEPARTMENT_OTHER)
Admission: EM | Admit: 2015-09-17 | Discharge: 2015-09-17 | Disposition: A | Discharge status: Home / Self Care | Payer: Medicare Other | Attending: Emergency Medicine | Admitting: Emergency Medicine

## 2015-09-17 ENCOUNTER — Encounter (HOSPITAL_BASED_OUTPATIENT_CLINIC_OR_DEPARTMENT_OTHER): Payer: Self-pay

## 2015-09-17 DIAGNOSIS — Z79899 Other long term (current) drug therapy: Secondary | ICD-10-CM | POA: Insufficient documentation

## 2015-09-17 DIAGNOSIS — E7800 Pure hypercholesterolemia, unspecified: Secondary | ICD-10-CM | POA: Diagnosis not present

## 2015-09-17 DIAGNOSIS — Z7982 Long term (current) use of aspirin: Secondary | ICD-10-CM | POA: Diagnosis not present

## 2015-09-17 DIAGNOSIS — I10 Essential (primary) hypertension: Secondary | ICD-10-CM | POA: Insufficient documentation

## 2015-09-17 DIAGNOSIS — R42 Dizziness and giddiness: Secondary | ICD-10-CM | POA: Insufficient documentation

## 2015-09-17 DIAGNOSIS — M1990 Unspecified osteoarthritis, unspecified site: Secondary | ICD-10-CM | POA: Insufficient documentation

## 2015-09-17 HISTORY — DX: Unspecified osteoarthritis, unspecified site: M19.90

## 2015-09-17 HISTORY — DX: Spinal stenosis, site unspecified: M48.00

## 2015-09-17 LAB — BASIC METABOLIC PANEL
Anion gap: 8 (ref 5–15)
BUN: 14 mg/dL (ref 6–20)
CO2: 30 mmol/L (ref 22–32)
CREATININE: 0.88 mg/dL (ref 0.44–1.00)
Calcium: 9.4 mg/dL (ref 8.9–10.3)
Chloride: 101 mmol/L (ref 101–111)
GFR calc Af Amer: >60 mL/min (ref >60)
GFR calc non Af Amer: >60 mL/min (ref >60)
GLUCOSE: 130 mg/dL — AB (ref 65–99)
POTASSIUM: 3.7 mmol/L (ref 3.5–5.1)
SODIUM: 139 mmol/L (ref 135–145)

## 2015-09-17 LAB — CBC WITH DIFFERENTIAL/PLATELET
Basophils Absolute: 0 10*3/uL (ref 0.0–0.1)
Basophils Relative: 1 %
EOS PCT: 4 %
Eosinophils Absolute: 0.2 10*3/uL (ref 0.0–0.7)
HCT: 35.9 % — ABNORMAL LOW (ref 36.0–46.0)
Hemoglobin: 11.9 g/dL — ABNORMAL LOW (ref 12.0–15.0)
LYMPHS ABS: 2.4 10*3/uL (ref 0.7–4.0)
Lymphocytes Relative: 42 %
MCH: 31.2 pg (ref 26.0–34.0)
MCHC: 33.1 g/dL (ref 30.0–36.0)
MCV: 94.2 fL (ref 78.0–100.0)
Monocytes Absolute: 0.5 10*3/uL (ref 0.1–1.0)
Monocytes Relative: 9 %
Neutro Abs: 2.5 10*3/uL (ref 1.7–7.7)
Neutrophils Relative %: 44 %
PLATELETS: 252 10*3/uL (ref 150–400)
RBC: 3.81 MIL/uL — ABNORMAL LOW (ref 3.87–5.11)
RDW: 13.1 % (ref 11.5–15.5)
WBC: 5.7 10*3/uL (ref 4.0–10.5)

## 2015-09-17 MED ORDER — MECLIZINE HCL 12.5 MG PO TABS: 25.0000 mg | ORAL_TABLET | Freq: Three times a day (TID) | ORAL | Status: DC | PRN

## 2015-09-17 MED ORDER — MECLIZINE HCL 25 MG PO TABS
50.0000 mg | ORAL_TABLET | Freq: Once | ORAL | Status: AC
  Administered 2015-09-17: 50 mg via ORAL
  Filled 2015-09-17: qty 2

## 2015-09-17 NOTE — ED Notes (Signed)
Up to b/r supervised, no assistance needed, steady gait, husband with pt. Verbalizes feeling unsteady. Alert, NAD, calm. Pending CT.

## 2015-09-17 NOTE — ED Notes (Signed)
Patient transported to CT 

## 2015-09-17 NOTE — ED Notes (Signed)
Pt refused head ct , request PA to check ears

## 2015-09-17 NOTE — ED Provider Notes (Signed)
Medical screening examination/treatment/procedure(s) were performed by non-physician practitioner and as supervising physician I was immediately available for consultation/collaboration.  Pt presents with complaints of dizziness.  Sx started when she had her head bend over and she lifted her head up.     No vision trouble.  ?whether her balance is affected although normally she has trouble.  No focal deficits on exam.  Suspect peripheral etiology although cannot completely exclude occult CVA.  We do not have MRI here.  Pt states she can also only tolerate an open MRI.    Labs reassuring.  Will try treat for peripheral vertigo.  Follow up with her neruologist  EKG Interpretation   Date/Time:  Monday Sep 17 2015 18:25:39 EDT Ventricular Rate:  70 PR Interval:  126 QRS Duration: 82 QT Interval:  406 QTC Calculation: 438 R Axis:   65 Text Interpretation:  Normal sinus rhythm Normal ECG No significant change  since last tracing Confirmed by Calil Amor  MD-J, Ashante Snelling ([id number]) on 09/17/2015  6:42:29 PM        Dorie Rank, MD 09/17/15 2017

## 2015-09-17 NOTE — ED Provider Notes (Signed)
CSN: [id number]     Arrival date & time 09/17/15  1817 History   First MD Initiated Contact with Patient 09/17/15 1834     Chief Complaint  Patient presents with  . Dizziness     (Consider location/radiation/quality/duration/timing/severity/associated sxs/prior Treatment) HPI Comments: Patient presents with complaint of dizziness starting at approximately 7 PM last night. Patient has had vertigo in the past and states this does not feel like a true spinning sensation or sensation of motion. She does not feel lightheaded. She does however state that the symptoms are worse with lifting her head or in certain positions. Symptoms are intermittent. Patient denies signs of stroke including: facial droop, slurred speech, aphasia, weakness/numbness in extremities, imbalance/trouble walking. She is ambulatory at baseline with cane. No headache, vision change. She occasionally has tingling in her bilateral arms and legs. She is concerned about an inner ear problem. No treatments prior to arrival. Patient did state that she spent a long amount of time out in the heat 2 days ago and was concerned she was dehydrated.    The history is provided by the patient and medical records.    Past Medical History  Diagnosis Date  . Hypertension   . Thyroid disease   . Chronic pain   . Hypercholesteremia   . Spinal stenosis   . Arthritis    Past Surgical History  Procedure Laterality Date  . Neck surgery      [id number]   Family History  Problem Relation Age of Onset  . Dementia Mother   . Lung cancer Father    Social History  Substance Use Topics  . Smoking status: Never Smoker   . Smokeless tobacco: Never Used  . Alcohol Use: No   OB History    No data available     Review of Systems  Constitutional: Negative for fever.  HENT: Negative for congestion, dental problem, rhinorrhea and sinus pressure.   Eyes: Negative for photophobia, discharge, redness and visual disturbance.  Respiratory:  Negative for shortness of breath.   Cardiovascular: Negative for chest pain.  Gastrointestinal: Negative for nausea and vomiting.  Musculoskeletal: Negative for gait problem, neck pain and neck stiffness.  Skin: Negative for rash.  Neurological: Positive for dizziness. Negative for syncope, speech difficulty, weakness, light-headedness, numbness and headaches.  Psychiatric/Behavioral: Positive for confusion (chronic).    Allergies  Other and Macrobid  Home Medications   Prior to Admission medications   Medication Sig Start Date End Date Taking? Authorizing Provider  aspirin EC 81 MG tablet Take 81 mg by mouth daily.    Historical Provider, MD  atorvastatin (LIPITOR) 20 MG tablet Take 20 mg by mouth daily.    Historical Provider, MD  BLACK COHOSH EXTRACT PO Take by mouth daily.    Historical Provider, MD  Cholecalciferol (VITAMIN D3) 2000 UNITS TABS Take 1 tablet by mouth daily.    Historical Provider, MD  diclofenac (VOLTAREN) 75 MG EC tablet Take 75 mg by mouth 2 (two) times daily.    Historical Provider, MD  HYDROcodone-acetaminophen (NORCO/VICODIN) 5-325 MG per tablet Take 1 tablet by mouth every 6 (six) hours as needed. 10/20/14   Al Corpus, PA-C  Hydrocodone-Acetaminophen 5-300 MG TABS Take 5-300 mg by mouth as needed.    Historical Provider, MD  iron polysaccharides (NU-IRON) 150 MG capsule Take 150 mg by mouth 2 (two) times daily.    Historical Provider, MD  levothyroxine (SYNTHROID, LEVOTHROID) 125 MCG tablet Take 125 mcg by mouth daily.    Historical  Provider, MD  metoprolol succinate (TOPROL-XL) 25 MG 24 hr tablet Take 25 mg by mouth 2 (two) times daily.    Historical Provider, MD  Multiple Vitamin (MULTIVITAMIN WITH MINERALS) TABS Take 1 tablet by mouth daily.    Historical Provider, MD  Nutritional Supplements (ESTROVEN PO) Take 1 tablet by mouth daily.    Historical Provider, MD  nystatin-triamcinolone (MYCOLOG II) cream Apply 1 application topically 2 (two) times daily  as needed.     Historical Provider, MD  Omega-3 Fatty Acids (FISH OIL) 1200 MG CAPS Take by mouth daily.    Historical Provider, MD  tiZANidine (ZANAFLEX) 4 MG tablet Take 2 mg by mouth daily.    Historical Provider, MD  topiramate (TOPAMAX) 25 MG tablet Take 1 tablet (25 mg total) by mouth 2 (two) times daily. 04/18/13   [name] Fila, MD  triamterene-hydrochlorothiazide (MAXZIDE-25) 37.5-25 MG per tablet Take 1 tablet by mouth daily.    Historical Provider, MD   BP 184/70 mmHg  Pulse 66  Temp(Src) 98.7 F (37.1 C) (Oral)  Resp 18  Ht 5\' 7"  (1.702 m)  Wt 79.833 kg  BMI 27.56 kg/m2  SpO2 100%   Physical Exam  Constitutional: She is oriented to person, place, and time. She appears well-developed and well-nourished.  HENT:  Head: Normocephalic and atraumatic.  Right Ear: Tympanic membrane, external ear and ear canal normal.  Left Ear: Tympanic membrane, external ear and ear canal normal.  Nose: Nose normal.  Mouth/Throat: Uvula is midline, oropharynx is clear and moist and mucous membranes are normal.  Eyes: Conjunctivae, EOM and lids are normal. Pupils are equal, round, and reactive to light. Right eye exhibits no nystagmus. Left eye exhibits no nystagmus.  Neck: Normal range of motion. Neck supple.  Cardiovascular: Normal rate and regular rhythm.   Pulmonary/Chest: Effort normal and breath sounds normal. No respiratory distress. She has no wheezes. She has no rales.  Abdominal: Soft. There is no tenderness.  Musculoskeletal:       Cervical back: She exhibits normal range of motion, no tenderness and no bony tenderness.  Neurological: She is alert and oriented to person, place, and time. She has normal strength and normal reflexes. No cranial nerve deficit or sensory deficit. She displays a negative Romberg sign. Coordination and gait normal. GCS eye subscore is 4. GCS verbal subscore is 5. GCS motor subscore is 6.  Skin: Skin is warm and dry.  Psychiatric: She has a normal mood and  affect.  Nursing note and vitals reviewed.   ED Course  Procedures (including critical care time) Labs Review Labs Reviewed  CBC WITH DIFFERENTIAL/PLATELET - Abnormal; Notable for the following:    RBC 3.81 (*)    Hemoglobin 11.9 (*)    HCT 35.9 (*)    All other components within normal limits  BASIC METABOLIC PANEL - Abnormal; Notable for the following:    Glucose, Bld 130 (*)    All other components within normal limits    Imaging Review Ct Head Wo Contrast  09/17/2015  CLINICAL DATA:  Dizziness. EXAM: CT HEAD WITHOUT CONTRAST TECHNIQUE: Contiguous axial images were obtained from the base of the skull through the vertex without intravenous contrast. COMPARISON:  None. FINDINGS: Ventricle size normal.  Cerebral volume normal for age. Mild chronic microvascular ischemic change in the white matter. No acute infarct. Negative for intracranial hemorrhage or mass. No edema or shift of the midline structures. Negative calvarium. IMPRESSION: Chronic microvascular ischemic changes.  No acute abnormality. Electronically Signed  By: Franchot Gallo M.D.   On: 09/17/2015 21:12   I have personally reviewed and evaluated these images and lab results as part of my medical decision-making.   EKG Interpretation   Date/Time:  Monday Sep 17 2015 18:25:39 EDT Ventricular Rate:  70 PR Interval:  126 QRS Duration: 82 QT Interval:  406 QTC Calculation: 438 R Axis:   65 Text Interpretation:  Normal sinus rhythm Normal ECG No significant change  since last tracing Confirmed by KNAPP  MD-J, JON [id number]) on 09/17/2015  6:42:29 PM       Patient seen and examined. Work-up initiated.   Vital signs reviewed and are as follows: BP 184/70 mmHg  Pulse 66  Temp(Src) 98.7 F (37.1 C) (Oral)  Resp 18  Ht 5\' 7"  (1.702 m)  Wt 79.833 kg  BMI 27.56 kg/m2  SpO2 100%  Patient was undecided on head CT.   8:28 PM Patient discussed with Dr. Tomi Bamberger who has seen patient. Patient now would like head CT.     10:07 PM patient updated on results. Will give meclizine prior to arrival. Encouraged patient to follow-up with her PCP and neurologist in the next 1 week for further evaluation. Patient and husband agree with plan.  No orthostasis however patient's blood pressure was significantly elevated. This was rechecked manually and found to be 164/80. Patient to take her usual blood pressure regimen at home.  Patient counseled to return if they have weakness in their arms or legs, slurred speech, trouble walking or talking, confusion, trouble with their balance, or if they have any other concerns. Patient verbalizes understanding and agrees with plan.    MDM   Final diagnoses:  Dizziness  Essential hypertension   Patient with dizziness described as "wooziness". Features of vertigo however patient denies definitive sensation of motion. She does not seem to be lightheaded or orthostatic. She is not ataxic. She does not have any focal neurological findings on exam tonight. No large stroke on CT, however admittedly this is not the best test to rule out posterior circulation stroke. At this point I am not concerned enough about posterior circulation stroke to refer to [facility] for MRI testing. Blood pressure is elevated. Would consider admission for hypertensive emergency if blood pressure was persistently greater than 200, however prior to discharge it was 164/80 manually. Patient has been up in the ED without assistance.   Carlisle Cater, PA-C 09/17/15 2209  Dorie Rank, MD 09/17/15 2214

## 2015-09-17 NOTE — Discharge Instructions (Signed)
Please read and follow all provided instructions.  Your diagnoses today include:  1. Dizziness   2. Essential hypertension     Tests performed today include:  Blood counts and electrolytes  EKG  Urine test  Head CT - no large strokes, bleeding, tumors  Vital signs. See below for your results today.   Medications prescribed:   Meclizine - medication for dizziness  Take any prescribed medications only as directed.  Home care instructions:  Follow any educational materials contained in this packet.  Follow-up instructions: Please follow-up with your primary care provider in the next 3 days for further evaluation of your symptoms.   Return instructions:   Please return to the Emergency Department if you experience worsening symptoms.  Return if you have weakness in your arms or legs, slurred speech, trouble walking or talking, confusion, or trouble with your balance.   Please return if you have any other emergent concerns.  Additional Information:  Your vital signs today were: BP 164/80 mmHg   Pulse 62   Temp(Src) 98.7 F (37.1 C) (Oral)   Resp 18   Ht 5\' 7"  (1.702 m)   Wt 79.833 kg   BMI 27.56 kg/m2   SpO2 96% If your blood pressure (BP) was elevated above 135/85 this visit, please have this repeated by your doctor within one month. --------------

## 2015-09-17 NOTE — ED Notes (Signed)
Pa  at bedside. 

## 2015-09-17 NOTE — ED Notes (Signed)
Pt in CT.

## 2015-09-17 NOTE — ED Notes (Signed)
Pt c/o intermittent dizziness since yesterday-"dull sensation" to forehead and top of head-denies as HA also c/o ears feel stopped up-pt NAD-steady gait with own cane-was placed in w/c due to c/o

## 2015-09-17 NOTE — ED Notes (Signed)
Pt amb to BR w/o assist with quick and  steady gait, denies dizziness

## 2015-09-27 ENCOUNTER — Ambulatory Visit (INDEPENDENT_AMBULATORY_CARE_PROVIDER_SITE_OTHER): Payer: Medicare Other | Admitting: Neurology

## 2015-09-27 ENCOUNTER — Encounter: Payer: Self-pay | Admitting: Neurology

## 2015-09-27 VITALS — BP 197/72 | HR 62 | Ht 67.0 in | Wt 180.0 lb

## 2015-09-27 DIAGNOSIS — R42 Dizziness and giddiness: Secondary | ICD-10-CM | POA: Diagnosis not present

## 2015-09-27 NOTE — Progress Notes (Signed)
[name] [address] [place]. [place] [address] [phone]       OFFICE CONSULT NOTE  Lauren. Lauren Garrett Date of Birth:  09-Feb-1946 Medical Record Number:  [id number]   Referring MD:  Guerry Bruin Reason for Referral:  Dizzy spell  HPI: Lauren Garrett is a 18 year African-American lady who had a episode of sudden onset of dizziness on 09/17/15. She states she had a busy day prior and had been out in the sun and husband had been working out in the yard. He felt that she was leaning to the left and was dizzy and off balance. She felt nauseous but did not throw up. She denied vertigo or headache at that time. She had no blurred vision. She was seen in emergency room where CT scan of the head was obtained which I have personally reviewed and showed no acute abnormalities. Patient was given prescription of meclizine which he took and she is not sure it's been helping. She has noticed gradual improvement over the last 2 weeks but feels now slightly off-balance particularly when she moves her head a certain way or gets up quickly. She has had no falls or injuries. She denies any ringing in the ears, hearing loss. She has no prior history of positional vertigo or similar episodes. She does have a long-standing history of tension headaches which often triggered by stress. She in fact was seen by me in 2014 for these symptoms. She also has history of degenerative spine disease and has had 2 cervical spine surgeries done in 1998 by Dr. Gerlene Fee at [facility] and by Dr. Mikal Plane at at [facility] later in 2007. She complains of pain in the knee as well as hip. She has seen Dr. Cleophas Dunker in the past. She wants to get an MRI done of the hip and the knee and wants me to moderate but I explained to her that do not deal with musculoskeletal etiology and she needs to see Dr. Cleophas Dunker for the same. She had an EMG no conduction study done in 2011 by Dr. Alvester Morin which she has brought for me to review the  results. It showed severe chronic left L4 coagulopathy and moderate to severe chronic right L4, L5 and S1 medical this. Surprisingly she apparently had an EMG nerve conduction study done 2 weeks ago by Dr. Murray Hodgkins which apparently did not show this radiculopathy is but I do not have the report to look at today. She's been complaining of tingling numbness on the tips of her fingers and feet for more than a year. She states her tension headaches are mild and not quite disabling. She describes this as bitemporal pressure which is present almost daily but she can tolerate it. She does admit her blood pressure has recently been high his affect has 3 started metoprolol to help.  Office visit 03/23/13 :Lauren Milbrath is a 76 year lady who has had intermittent transient tingling and numbness starting in left temple in July 2014. Few days later it spread to left jaw and few weeks later to left great toe and second toe.There are no triggers except perhaps stress. No relieving factors.Lasts from 20 minutes to few hours but are not bothersome.she denies ant neck or radicular pain or weakness. No headaches, loss of vision. She had MRI brain 11/19/12 which I have reviewed personally and show nonspecific periventricular, subcortical and brainstem white matter hyperintensities with a wide differential. 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 Update 03/23/13 : She returns for followup of the last was a 02/02/13. She has noticed some improvement in her intermittent left face numbness which she still gets off and on when she is stressed out. She complains of new left frontal headaches and has had 5 episodes since last visit. She  describes them as moderate in severity muscle pulling sensation not accompanied by nausea and vomiting light or sound sensitivity. The headache seems to be increased by stress or exertion. She has found that taking Zanaflex helps the headache though 4 mg makes her feel groggy. She has tried to do some activities for stress vaccination intermittently which seem to help her but she cannot do them regularly. She did not undergo MRI scan of the neck has she saw Dr. Cyndy Freeze who suggested that it be ordered as part of her Worker's Compensation and it is scheduled for next Friday. Lab work done on 02/02/13 show normal ESR and ANA panel. Lyme antibody was negative. ROS:   14 system review of systems is positive for dizziness, imbalance, leaning to the left, cough, frequent waking, memory loss, headache, weakness, joint pain, gait and balance problems. And all other systems negative.  PMH:  Past Medical History  Diagnosis Date  . Hypertension   . Thyroid disease   . Chronic pain   . Hypercholesteremia   . Spinal stenosis   . Arthritis   . Headache     Social History:  Social History   Social History  . Marital Status: Married    Spouse Name: [name]  . Number of Children: 2  . Years of Education: master's    Occupational History  . Rtired     Social History Main Topics  . Smoking status: Never Smoker   . Smokeless tobacco: Never Used  . Alcohol Use: No  . Drug Use: No  . Sexual Activity: Not on file   Other Topics Concern  . Not on file   Social History Narrative   Patient lives at home spouse.   Caffeine Use: rarely    Medications:   Current Outpatient Prescriptions on File Prior to Visit  Medication Sig Dispense Refill  . aspirin EC 81 MG tablet Take 81 mg by mouth daily.    Marland Kitchen atorvastatin (LIPITOR) 20 MG tablet Take 20 mg by mouth daily.    Marland Kitchen BLACK COHOSH EXTRACT PO Take by mouth daily.    . Cholecalciferol (VITAMIN D3) 2000 UNITS TABS Take 1 tablet by mouth daily.    .  diclofenac (VOLTAREN) 75 MG EC tablet Take 75 mg by mouth 2 (two) times daily.    . Hydrocodone-Acetaminophen 5-300 MG TABS Take 5-300 mg by mouth as needed.    . iron polysaccharides (NU-IRON) 150 MG capsule Take 150 mg by mouth 2 (two) times daily.    Marland Kitchen levothyroxine (SYNTHROID, LEVOTHROID) 125 MCG tablet Take 125 mcg by mouth daily.    . meclizine (ANTIVERT) 12.5 MG tablet Take 2 tablets (25 mg total) by mouth 3 (three) times daily as needed for dizziness. 20 tablet 0  . metoprolol succinate (TOPROL-XL) 25 MG 24 hr tablet Take 25 mg by mouth 2 (two) times daily.    Marland Kitchen  Multiple Vitamin (MULTIVITAMIN WITH MINERALS) TABS Take 1 tablet by mouth daily.    . Nutritional Supplements (ESTROVEN PO) Take 1 tablet by mouth daily.    Marland Kitchen nystatin-triamcinolone (MYCOLOG II) cream Apply 1 application topically 2 (two) times daily as needed.     . Omega-3 Fatty Acids (FISH OIL) 1200 MG CAPS Take by mouth daily.    Marland Kitchen tiZANidine (ZANAFLEX) 4 MG tablet Take 2 mg by mouth daily.    Marland Kitchen triamterene-hydrochlorothiazide (MAXZIDE-25) 37.5-25 MG per tablet Take 1 tablet by mouth daily.     No current facility-administered medications on file prior to visit.    Allergies:   Allergies  Allergen Reactions  . Other     Pt feels she is allergic to another med but unsure of name  . Macrobid [Nitrofurantoin Monohydrate Macrocrystals] Itching and Rash    Physical Exam General: well developed, well nourished Middle-aged African-American lady who is quite anxious, seated, in no evident distress Head: head normocephalic and atraumatic.   Neck: supple with no carotid or supraclavicular bruits Cardiovascular: regular rate and rhythm, no murmurs Musculoskeletal: no deformity. Right knee has genu valgus deformity Skin:  no rash/petichiae Vascular:  Normal pulses all extremities  Neurologic Exam Mental Status: Awake and fully alert. Oriented to place and time. Recent and remote memory intact. Attention span, concentration  and fund of knowledge appropriate. Mood and affect appropriate.  Cranial Nerves: Fundoscopic exam reveals sharp disc margins. Pupils equal, briskly reactive to light. Extraocular movements full without nystagmus. Visual fields full to confrontation. Hearing intact. Facial sensation intact. Face, tongue, palate moves normally and symmetrically.  Motor: Normal bulk and tone. Normal strength in all tested extremity muscles. Sensory.: intact to touch , pinprick , position and vibratory sensation.  Coordination: Rapid alternating movements normal in all extremities. Finger-to-nose and heel-to-shin performed accurately bilaterally. Gait and Station: Arises from chair without difficulty. Stance is broad-based. Gait demonstrates normal stride length and mild imbalance . Not able to heel, toe and tandem walk without difficulty.  Reflexes: 2+ and symmetric. Toes downgoing.       ASSESSMENT: 68 year African-American lady with the episode of dizziness and imbalance of unclear etiology possibilities include posterior circulation TIA versus small stroke or peripheral vestibular dysfunction. Long-standing history of chronic tension headaches as well as degenerative spine disease and arthritis with chronic gait difficulties.    PLAN: I had a long discussion with the patient and husband regarding her episode of sudden onset of dizziness and leaning to the left being of unclear etiology. Certainly posterior circulation small infarcts/TIA is a possibility. Recommend the check MRI scan the brain with and without contrast with thin sections through the internal auditory canal look for structural or vascular lesions. She will continue aspirin for stroke prevention with strict control of hypertension with blood pressure goal below 130/90 and lipids with LDL cholesterol goal below 70 mg percent. She will continue with Zanaflex for her chronic tension headaches . I advised the patient to follow-up with Dr. Mikal Plane for her  spine and back problems Birth weight not on file will send for results of nerve conduction EMG study done recently and Dr. Chari Manning office. She was advised to follow-up with her orthopedic surgeon Dr. Cleophas Dunker for her knee and hip complaints and possible MRI scan if necessary and return for follow-up in 2 months or call earlier if necessary. Greater than 50% time during this 45 minute consultation was spent on counseling and coordination of care about her dizziness, tension headache as well  as muscular skeletal pain Antony Contras, MD  [facility] [address] [place], [place] [address]  Phone [phone] Fax [phone] Note: This document was prepared with digital dictation and possible smart phrase technology. Any transcriptional errors that result from this process are unintentional.

## 2015-09-27 NOTE — Patient Instructions (Signed)
I had a long discussion with the patient and husband regarding her episode of sudden onset of dizziness and leaning to the left being of unclear etiology. Certainly posterior circulation small infarcts/TIA is a possibility. Recommend the check MRI scan the brain with and without contrast with thin sections through the internal auditory canal look for structural or vascular lesions. She will continue aspirin for stroke prevention with strict control of hypertension with blood pressure goal below 130/90 and lipids with LDL cholesterol goal below 70 mg percent. She will continue with Zanaflex for her chronic tension headaches . I advised the patient to follow-up with Dr. Cyndy Freeze for her spine and back problems Birth weight not on file will send for results of nerve conduction EMG study done recently and Dr. Lauris Poag office. She was advised to follow-up with her orthopedic surgeon Dr. Durward Fortes for her knee and hip complaints and possible MRI scan if necessary and return for follow-up in 2 months or call earlier if necessary.

## 2015-10-05 ENCOUNTER — Telehealth: Payer: Self-pay | Admitting: Neurology

## 2015-10-05 NOTE — Telephone Encounter (Signed)
Rn does not have EKG results yet. Rn will inquire from Dr. Leonie Man.

## 2015-10-05 NOTE — Telephone Encounter (Signed)
Spoke with patient and she told me that Dr. Leonie Man was requesting to have results from an EKG sent to [place] and she wanted to know if they had been sent. Please call and advise.

## 2015-10-09 ENCOUNTER — Telehealth: Payer: Self-pay

## 2015-10-09 NOTE — Telephone Encounter (Signed)
Rn call [facility] office at [phone]. Rn spoke with Butch Penny about faxing over EMG results. Rn stated pt sign a release form and its another MD office requesting. Rn stated patient was seen by Dr. Leonie Man on 09/27/2015. Rn stated Dr.Cabbell did the EMG test. Butch Penny stated pt had NCV/EMG in May 2017. RN gave fax number of [phone] for results of test.

## 2015-10-09 NOTE — Telephone Encounter (Signed)
Rn receive EMG report from [facility]. Report put on Dr.Sethi in box.

## 2015-10-12 DIAGNOSIS — R42 Dizziness and giddiness: Secondary | ICD-10-CM | POA: Diagnosis not present

## 2015-10-16 ENCOUNTER — Ambulatory Visit (INDEPENDENT_AMBULATORY_CARE_PROVIDER_SITE_OTHER): Payer: Self-pay

## 2015-10-16 DIAGNOSIS — R42 Dizziness and giddiness: Secondary | ICD-10-CM

## 2015-10-16 DIAGNOSIS — Z0289 Encounter for other administrative examinations: Secondary | ICD-10-CM

## 2015-10-17 ENCOUNTER — Other Ambulatory Visit: Payer: Medicare Other

## 2015-10-19 ENCOUNTER — Other Ambulatory Visit: Payer: Self-pay | Admitting: Neurosurgery

## 2015-10-19 DIAGNOSIS — M4316 Spondylolisthesis, lumbar region: Secondary | ICD-10-CM

## 2015-11-02 ENCOUNTER — Ambulatory Visit
Admission: RE | Admit: 2015-11-02 | Discharge: 2015-11-02 | Disposition: A | Discharge status: Home / Self Care | Payer: Worker's Compensation | Source: Ambulatory Visit | Attending: Neurosurgery | Admitting: Neurosurgery

## 2015-11-02 DIAGNOSIS — M4316 Spondylolisthesis, lumbar region: Secondary | ICD-10-CM

## 2015-11-02 MED ORDER — METHYLPREDNISOLONE ACETATE 40 MG/ML INJ SUSP (RADIOLOG
120.0000 mg | Freq: Once | INTRAMUSCULAR | Status: AC
  Administered 2015-11-02: 120 mg via EPIDURAL

## 2015-11-02 MED ORDER — IOPAMIDOL (ISOVUE-M 200) INJECTION 41%
1.0000 mL | Freq: Once | INTRAMUSCULAR | Status: AC
  Administered 2015-11-02: 1 mL via EPIDURAL

## 2015-11-02 NOTE — Discharge Instructions (Signed)

## 2015-11-06 ENCOUNTER — Telehealth: Payer: Self-pay | Admitting: *Deleted

## 2015-11-06 NOTE — Telephone Encounter (Signed)
Per Dr Leonie Man, spoke with patient and informed her that her MRI brain showed mild hardening of the arteries, no worrisome or new findings,  expected slight increase, compared to MRI 3 years ago. Patient inquired about medication changes; advised her that Dr Leonie Man has no medication changes at this time. Confirmed her follow up next month, requested she arrive 15 min early to check in. She verbalized understanding, appreciation.

## 2015-12-05 ENCOUNTER — Encounter: Payer: Self-pay | Admitting: Neurology

## 2015-12-05 ENCOUNTER — Ambulatory Visit (INDEPENDENT_AMBULATORY_CARE_PROVIDER_SITE_OTHER): Payer: Medicare Other | Admitting: Neurology

## 2015-12-05 ENCOUNTER — Telehealth: Payer: Self-pay | Admitting: Neurology

## 2015-12-05 VITALS — BP 175/71 | HR 69 | Ht 67.0 in | Wt 177.0 lb

## 2015-12-05 DIAGNOSIS — R42 Dizziness and giddiness: Secondary | ICD-10-CM

## 2015-12-05 NOTE — Patient Instructions (Signed)
I had a long discussion with the patient and [name] regarding her dizziness which appears to be multifactorial likely due to mild peripheral vestibular dysfunction, effect of blood pressure medications and, white matter changes in the brain and arthritis. I recommend she see her primary care physician to change her blood pressure medications as she feels the recent medication changes has made it worse. I advised her to do regular stress relaxation activities to help with a mild tension headaches. No scheduled neurological appointment is necessary but patient may return in the future if needed.

## 2015-12-05 NOTE — Progress Notes (Signed)
[name] [address] [place]. [name] [address] [phone]       OFFICE FOLLOW UP NOTE  Lauren Garrett Date of Birth:  03/06/1946 Medical Record Number:  [id number]   Referring MD:  Richard Tisovec Reason for Referral:  Dizzy spell  HPI:  Initial Consult 09/27/2015 : Lauren Garrett is a 69 year African-American lady who had a episode of sudden onset of dizziness on 09/17/15. She states she had a busy day prior and had been out in the sun and husband had been working out in the yard. He felt that she was leaning to the left and was dizzy and off balance. She felt nauseous but did not throw up. She denied vertigo or headache at that time. She had no blurred vision. She was seen in emergency room where CT scan of the head was obtained which I have personally reviewed and showed no acute abnormalities. Patient was given prescription of meclizine which he took and she is not sure it's been helping. She has noticed gradual improvement over the last 2 weeks but feels now slightly off-balance particularly when she moves her head a certain way or gets up quickly. She has had no falls or injuries. She denies any ringing in the ears, hearing loss. She has no prior history of positional vertigo or similar episodes. She does have a long-standing history of tension headaches which often triggered by stress. She in fact was seen by me in 2014 for these symptoms. She also has history of degenerative spine disease and has had 2 cervical spine surgeries done in 1998 by Dr. Kritzer at [facility] and by Dr. Cabell at at [facility] later in 2007. She complains of pain in the knee as well as hip. She has seen Dr. Whitfield in the past. She wants to get an MRI done of the hip and the knee and wants me to moderate but I explained to her that do not deal with musculoskeletal etiology and she needs to see Dr. Whitfield for the same. She had an EMG no conduction study done in 2011 by Dr. Newton which she has  brought for me to review the results. It showed severe chronic left L4 coagulopathy and moderate to severe chronic right L4, L5 and S1 medical this. Surprisingly she apparently had an EMG nerve conduction study done 2 weeks ago by Dr. Bartko which apparently did not show this radiculopathy is but I do not have the report to look at today. She's been complaining of tingling numbness on the tips of her fingers and feet for more than a year. She states her tension headaches are mild and not quite disabling. She describes this as bitemporal pressure which is present almost daily but she can tolerate it. She does admit her blood pressure has recently been high his affect has 3 started metoprolol to help.  Office visit 03/23/13 :Lauren Garrett is a 66 year lady who has had intermittent transient tingling and numbness starting in left temple in July 2014. Few days later it spread to left jaw and few weeks later to left great toe and second toe.There are no triggers except perhaps stress. No relieving factors.Lasts from 20 minutes to few hours but are not bothersome.she denies ant neck or radicular pain or weakness. No headaches, loss of vision. She had MRI brain 11/19/12 which I have reviewed personally and show nonspecific periventricular, subcortical and brainstem white matter hyperintensities with a wide differential. 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 Update 03/23/13 : She returns for followup of the last was a 02/02/13. She has noticed some improvement in her intermittent left face numbness which she still gets off and on when she is stressed out. She complains of new left frontal headaches and has had 5 episodes  since last visit. She describes them as moderate in severity muscle pulling sensation not accompanied by nausea and vomiting light or sound sensitivity. The headache seems to be increased by stress or exertion. She has found that taking Zanaflex helps the headache though 4 mg makes her feel groggy. She has tried to do some activities for stress vaccination intermittently which seem to help her but she cannot do them regularly. She did not undergo MRI scan of the neck has she saw Dr. Cyndy Freeze who suggested that it be ordered as part of her Worker's Compensation and it is scheduled for next Friday. Lab work done on 02/02/13 show normal ESR and ANA panel. Lyme antibody was negative. Update 12/05/2015 : She returns for follow-up after last visit 2 months ago. She is accompanied by a female - [name].She continues to have intermittent dizziness but these are not progressive. She states she's not convinced this is related to a blood pressure medications as she made some recent changes upon the instructions a primary physician and feels the dizziness is related to that. She had MRI scan of the brain done on 10/12/15 which have reviewed shows only mild periventricular white matter hyperintensities which appear slightly progressed compared with the previous MRI but are age appropriate. Patient also complains of intermittent bitemporal numbness mild headaches which are intermittent but not disabling. She has not been participating in any activities for stress laxation. She complains of pain in her joints from arthritis and is back and spine. ROS:   14 system review of systems is positive for dizziness, imbalance,   headache, weakness, joint pain, gait and balance problems. And all other systems negative.  PMH:  Past Medical History:  Diagnosis Date  . Arthritis   . Chronic pain   . Headache   . Hypercholesteremia   . Hypertension   . Spinal stenosis   . Thyroid disease     Social History:  Social History   Social  History  . Marital status: Married    Spouse name: Lauren Garrett  . Number of children: 2  . Years of education: master's    Occupational History  . Rtired  Retired   Social History Main Topics  . Smoking status: Never Smoker  . Smokeless tobacco: Never Used  . Alcohol use No  . Drug use: No  . Sexual activity: Not on file   Other Topics Concern  . Not on file   Social History Narrative   Patient lives at home spouse.   Caffeine Use: rarely    Medications:   Current Outpatient Prescriptions on File Prior to Visit  Medication Sig Dispense Refill  . aspirin EC 81 MG tablet Take 81 mg by mouth daily.    Marland Kitchen atorvastatin (LIPITOR) 20 MG tablet Take 20 mg by mouth daily.    . Cholecalciferol (VITAMIN D3) 2000 UNITS TABS Take 1  tablet by mouth daily.    . diclofenac (VOLTAREN) 75 MG EC tablet Take 75 mg by mouth 2 (two) times daily.    . Hydrocodone-Acetaminophen 5-300 MG TABS Take 5-300 mg by mouth as needed.    . iron polysaccharides (NU-IRON) 150 MG capsule Take 150 mg by mouth 2 (two) times daily.    Marland Kitchen levothyroxine (SYNTHROID, LEVOTHROID) 125 MCG tablet Take 125 mcg by mouth daily.    . metoprolol succinate (TOPROL-XL) 25 MG 24 hr tablet Take 25 mg by mouth daily.     . Multiple Vitamin (MULTIVITAMIN WITH MINERALS) TABS Take 1 tablet by mouth daily.    . Nutritional Supplements (ESTROVEN PO) Take 1 tablet by mouth daily.    Marland Kitchen nystatin-triamcinolone (MYCOLOG II) cream Apply 1 application topically 2 (two) times daily as needed.     . Omega-3 Fatty Acids (FISH OIL) 1200 MG CAPS Take by mouth daily.    Marland Kitchen tiZANidine (ZANAFLEX) 4 MG tablet Take 2 mg by mouth daily.    Marland Kitchen triamterene-hydrochlorothiazide (MAXZIDE-25) 37.5-25 MG per tablet Take 1 tablet by mouth daily.     No current facility-administered medications on file prior to visit.     Allergies:   Allergies  Allergen Reactions  . Other     Pt feels she is allergic to another med but unsure of name  . Macrobid [Nitrofurantoin  Monohydrate Macrocrystals] Itching and Rash    Physical Exam General: well developed, well nourished Middle-aged African-American lady who is quite anxious, seated, in no evident distress Head: head normocephalic and atraumatic.   Neck: supple with no carotid or supraclavicular bruits Cardiovascular: regular rate and rhythm, no murmurs Musculoskeletal: no deformity. Right knee has genu valgus deformity Skin:  no rash/petichiae Vascular:  Normal pulses all extremities  Neurologic Exam Mental Status: Awake and fully alert. Oriented to place and time. Recent and remote memory intact. Attention span, concentration and fund of knowledge appropriate. Mood and affect appropriate.  Cranial Nerves: Fundoscopic exam not done  . Pupils equal, briskly reactive to light. Extraocular movements full without nystagmus. Visual fields full to confrontation. Hearing intact. Facial sensation intact. Face, tongue, palate moves normally and symmetrically.  Motor: Normal bulk and tone. Normal strength in all tested extremity muscles. Sensory.: intact to touch , pinprick , position and vibratory sensation.  Coordination: Rapid alternating movements normal in all extremities. Finger-to-nose and heel-to-shin performed accurately bilaterally. Gait and Station: Arises from chair without difficulty. Stance is broad-based. Gait demonstrates normal stride length and mild imbalance . Not able to heel, toe and tandem walk without difficulty.  Reflexes: 2+ and symmetric. Toes downgoing.       ASSESSMENT: 85 year African-American lady with the episode of dizziness likely multifactorial from white matter hyperintensities in brain, effect of hypertension medicines and rmild peripheral vestibular dysfunction. Long-standing history of chronic tension headaches as well as degenerative spine disease and arthritis with chronic gait difficulties.    PLAN: I had a long discussion with the patient and [name] regarding her dizziness  which appears to be multifactorial likely due to mild peripheral vestibular dysfunction, effect of blood pressure medications and, white matter changes in the brain and arthritis. I recommend she see her primary care physician to change her blood pressure medications as she feels the recent medication changes has made it worse. I advised her to do regular stress relaxation activities to help with a mild tension headaches. No scheduled neurological appointment is necessary but patient may return in the future if needed. Rikayla Demmon  Leonie Man, [facility] [address] [place], [place] [address]  Phone [phone] Fax [phone] Note: This document was prepared with digital dictation and possible smart phrase technology. Any transcriptional errors that result from this process are unintentional.

## 2015-12-28 ENCOUNTER — Encounter (HOSPITAL_BASED_OUTPATIENT_CLINIC_OR_DEPARTMENT_OTHER): Payer: Self-pay | Admitting: Emergency Medicine

## 2015-12-28 ENCOUNTER — Emergency Department (HOSPITAL_BASED_OUTPATIENT_CLINIC_OR_DEPARTMENT_OTHER)
Admission: EM | Admit: 2015-12-28 | Discharge: 2015-12-28 | Disposition: A | Discharge status: Home / Self Care | Payer: Medicare Other | Attending: Emergency Medicine | Admitting: Emergency Medicine

## 2015-12-28 DIAGNOSIS — Z79899 Other long term (current) drug therapy: Secondary | ICD-10-CM | POA: Diagnosis not present

## 2015-12-28 DIAGNOSIS — M25511 Pain in right shoulder: Secondary | ICD-10-CM | POA: Diagnosis present

## 2015-12-28 DIAGNOSIS — I10 Essential (primary) hypertension: Secondary | ICD-10-CM | POA: Insufficient documentation

## 2015-12-28 DIAGNOSIS — Z7982 Long term (current) use of aspirin: Secondary | ICD-10-CM | POA: Diagnosis not present

## 2015-12-28 DIAGNOSIS — D179 Benign lipomatous neoplasm, unspecified: Secondary | ICD-10-CM | POA: Diagnosis not present

## 2015-12-28 DIAGNOSIS — Z86018 Personal history of other benign neoplasm: Secondary | ICD-10-CM | POA: Insufficient documentation

## 2015-12-28 MED ORDER — PREDNISONE 10 MG PO TABS: 20.0000 mg | ORAL_TABLET | Freq: Two times a day (BID) | ORAL | 0 refills | Status: DC

## 2015-12-28 NOTE — ED Notes (Signed)
MD at bedside. 

## 2015-12-28 NOTE — ED Provider Notes (Signed)
[facility] Provider Note   CSN: [id number] Arrival date & time: 12/28/15  1311     History   Chief Complaint Chief Complaint  Patient presents with  . Shoulder Pain    HPI Lauren Garrett is a 70 y.o. female.  This patient is a 70 year old female with history of spinal stenosis with chronic pain. She presents for evaluation of right shoulder pain that radiates down her right arm. This is been worsening over the past several days. It began in the absence of any injury or trauma. It is worse with movement and range of motion. She denies any numbness or tingling. She denies any weakness. She does report a swollen area to the back of her right axilla. She's been told in the past that this is a "fatty cyst", however she feels as though it is getting larger and this may be the cause of her pain.   The history is provided by the patient.  Shoulder Pain   This is a new problem. Episode onset: Several days ago. The problem occurs constantly. The problem has been gradually worsening. The symptoms are aggravated by contact. She has tried nothing for the symptoms. The treatment provided no relief.    Past Medical History:  Diagnosis Date  . Arthritis   . Chronic pain   . Headache   . Hypercholesteremia   . Hypertension   . Spinal stenosis   . Thyroid disease     Patient Active Problem List   Diagnosis Date Noted  . 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 Dizziness and giddiness 09/27/2015  . Tension headache 03/23/2013  . Disturbance of skin sensation 02/02/2013  . H/O cervical spine surgery 02/02/2013  . Degeneration of lumbar or lumbosacral intervertebral disc 02/02/2013  . Generalized anxiety disorder 02/02/2013  . Hypercholesteremia     Past Surgical History:  Procedure Laterality Date  . NECK SURGERY     [id number]    OB History    No data available       Home Medications    Prior to Admission medications   Medication Sig Start Date End Date Taking? Authorizing  Provider  aspirin EC 81 MG tablet Take 81 mg by mouth daily.    Historical Provider, MD  atorvastatin (LIPITOR) 20 MG tablet Take 20 mg by mouth daily.    Historical Provider, MD  Cholecalciferol (VITAMIN D3) 2000 UNITS TABS Take 1 tablet by mouth daily.    Historical Provider, MD  diclofenac (VOLTAREN) 75 MG EC tablet Take 75 mg by mouth 2 (two) times daily.    Historical Provider, MD  Hydrocodone-Acetaminophen 5-300 MG TABS Take 5-300 mg by mouth as needed.    Historical Provider, MD  iron polysaccharides (NU-IRON) 150 MG capsule Take 150 mg by mouth 2 (two) times daily.    Historical Provider, MD  levothyroxine (SYNTHROID, LEVOTHROID) 125 MCG tablet Take 125 mcg by mouth daily.    Historical Provider, MD  losartan (COZAAR) 50 MG tablet TK 1 T PO QD 11/26/15   Historical Provider, MD  metoprolol succinate (TOPROL-XL) 25 MG 24 hr tablet Take 25 mg by mouth daily.     Historical Provider, MD  Multiple Vitamin (MULTIVITAMIN WITH MINERALS) TABS Take 1 tablet by mouth daily.    Historical Provider, MD  Nutritional Supplements (ESTROVEN PO) Take 1 tablet by mouth daily.    Historical Provider, MD  nystatin-triamcinolone (MYCOLOG II) cream Apply 1 application topically 2 (two) times daily as needed.     Historical Provider, MD  Omega-3 Fatty Acids (FISH OIL) 1200 MG CAPS Take by mouth daily.    Historical Provider, MD  predniSONE (DELTASONE) 10 MG tablet Take 2 tablets (20 mg total) by mouth 2 (two) times daily. 12/28/15   Veryl Speak, MD  tiZANidine (ZANAFLEX) 4 MG tablet Take 2 mg by mouth daily.    Historical Provider, MD  triamterene-hydrochlorothiazide (MAXZIDE-25) 37.5-25 MG per tablet Take 1 tablet by mouth daily.    Historical Provider, MD    Family History Family History  Problem Relation Age of Onset  . Dementia Mother   . Lung cancer Father   . Migraines Daughter     Social History Social History  Substance Use Topics  . Smoking status: Never Smoker  . Smokeless tobacco: Never Used    . Alcohol use No     Allergies   Other and Macrobid [nitrofurantoin monohydrate macrocrystals]   Review of Systems Review of Systems  All other systems reviewed and are negative.    Physical Exam Updated Vital Signs BP 189/67   Pulse 65   Temp 98.6 F (37 C) (Oral)   Resp 18   Ht 5\' 7"  (1.702 m)   Wt 176 lb (79.8 kg)   SpO2 100%   BMI 27.57 kg/m   Physical Exam  Constitutional: She is oriented to person, place, and time. She appears well-developed and well-nourished.  HENT:  Head: Normocephalic and atraumatic.  Neck: Normal range of motion. Neck supple.  Musculoskeletal: Normal range of motion.  There is a swollen area to the posterior right axilla consistent with a lipoma.  She has full range of motion of the shoulder without significant discomfort. She is able to flex, extend, and oppose all fingers. Sensation is intact throughout the entire hand. Ulnar and radial pulses are easily palpable.  Neurological: She is alert and oriented to person, place, and time.  Skin: Skin is warm and dry.  Nursing note and vitals reviewed.    ED Treatments / Results  Labs (all labs ordered are listed, but only abnormal results are displayed) Labs Reviewed - No data to display  EKG  EKG Interpretation None       Radiology No results found.  Procedures Procedures (including critical care time)  Medications Ordered in ED Medications - No data to display   Initial Impression / Assessment and Plan / ED Course  I have reviewed the triage vital signs and the nursing notes.  Pertinent labs & imaging results that were available during my care of the patient were reviewed by me and considered in my medical decision making (see chart for details).  Clinical Course    Patient presents with complaints of radicular shoulder pain that I suspect is more likely related to her spinal stenosis than the lipoma which she is concerned about. She has established care with Dr.  Christella Noa from neurosurgery and I've advised her to discuss this with him if she is not improving in the next week. For now, she will be placed on a dose of prednisone to see if this improves the radicular nature of her discomfort. She is to also take her existing Vicodin as needed for pain.  She is also expressed concern about what appears to be a lipoma to the back of her axilla. She has been given the contact information for [place] [facility] should she desire possible evaluation of this.  Final Clinical Impressions(s) / ED Diagnoses   Final diagnoses:  Right shoulder pain  Lipoma    New  Prescriptions Discharge Medication List as of 12/28/2015  1:57 PM    START taking these medications   Details  predniSONE (DELTASONE) 10 MG tablet Take 2 tablets (20 mg total) by mouth 2 (two) times daily., Starting Fri 12/28/2015, Print         Veryl Speak, MD 12/28/15 1450

## 2015-12-28 NOTE — ED Triage Notes (Signed)
Pt has bump on back of right shoulder that is making her arm tingle

## 2015-12-28 NOTE — Discharge Instructions (Signed)
Prednisone as prescribed.  Continue your pain medication as before.  Follow-up with Dr. Cyndy Freeze if not improving in the next 1-2 weeks.  Follow-up with general surgery to have your lipoma evaluated.

## 2016-01-16 ENCOUNTER — Other Ambulatory Visit: Payer: Self-pay | Admitting: Neurosurgery

## 2016-01-16 DIAGNOSIS — M4316 Spondylolisthesis, lumbar region: Secondary | ICD-10-CM

## 2016-01-29 ENCOUNTER — Ambulatory Visit
Admission: RE | Admit: 2016-01-29 | Discharge: 2016-01-29 | Disposition: A | Discharge status: Home / Self Care | Payer: Worker's Compensation | Source: Ambulatory Visit | Attending: Neurosurgery | Admitting: Neurosurgery

## 2016-01-29 DIAGNOSIS — M4316 Spondylolisthesis, lumbar region: Secondary | ICD-10-CM

## 2016-01-29 MED ORDER — IOPAMIDOL (ISOVUE-M 200) INJECTION 41%
1.0000 mL | Freq: Once | INTRAMUSCULAR | Status: AC
  Administered 2016-01-29: 1 mL via EPIDURAL

## 2016-01-29 MED ORDER — METHYLPREDNISOLONE ACETATE 40 MG/ML INJ SUSP (RADIOLOG
120.0000 mg | Freq: Once | INTRAMUSCULAR | Status: AC
  Administered 2016-01-29: 120 mg via EPIDURAL

## 2016-01-29 NOTE — Discharge Instructions (Signed)

## 2016-02-28 ENCOUNTER — Telehealth (INDEPENDENT_AMBULATORY_CARE_PROVIDER_SITE_OTHER): Payer: Self-pay | Admitting: Orthopaedic Surgery

## 2016-02-28 DIAGNOSIS — M25571 Pain in right ankle and joints of right foot: Secondary | ICD-10-CM

## 2016-02-28 DIAGNOSIS — M25572 Pain in left ankle and joints of left foot: Secondary | ICD-10-CM

## 2016-02-28 NOTE — Telephone Encounter (Signed)
Patient wants an MRI of both of her ankles performed before she comes to her next appointment.

## 2016-02-29 NOTE — Telephone Encounter (Signed)
Please advise 

## 2016-03-05 ENCOUNTER — Telehealth (INDEPENDENT_AMBULATORY_CARE_PROVIDER_SITE_OTHER): Payer: Self-pay | Admitting: Orthopaedic Surgery

## 2016-03-05 NOTE — Telephone Encounter (Signed)
Did [facility] send over MRI reports of pelvis and knees?  [place] (nurse case manager)

## 2016-03-06 NOTE — Telephone Encounter (Signed)
Left VM for contact information ,reports  MRI results are not in EPIC

## 2016-03-07 NOTE — Telephone Encounter (Signed)
Ok for MRI's both ankles

## 2016-03-11 NOTE — Telephone Encounter (Signed)
Both sent for referral

## 2016-03-20 NOTE — Telephone Encounter (Signed)
Lauren Garrett (patients w/c nurse ) faxed over mri reports for  Pelvic and bilateral knees, she wants to make sure they were received.   Cb#: [phone]

## 2016-03-20 NOTE — Telephone Encounter (Signed)
Spoke with [employer] and asked her to fax to our new fax number.

## 2016-03-21 ENCOUNTER — Ambulatory Visit (INDEPENDENT_AMBULATORY_CARE_PROVIDER_SITE_OTHER): Payer: Medicare Other | Admitting: Orthopaedic Surgery

## 2016-03-24 ENCOUNTER — Ambulatory Visit (INDEPENDENT_AMBULATORY_CARE_PROVIDER_SITE_OTHER): Payer: Medicare Other | Admitting: Orthopaedic Surgery

## 2016-04-07 ENCOUNTER — Ambulatory Visit (INDEPENDENT_AMBULATORY_CARE_PROVIDER_SITE_OTHER): Payer: Medicare Other | Admitting: Orthopaedic Surgery

## 2016-04-07 ENCOUNTER — Ambulatory Visit (INDEPENDENT_AMBULATORY_CARE_PROVIDER_SITE_OTHER): Payer: Worker's Compensation | Admitting: Orthopaedic Surgery

## 2016-04-07 DIAGNOSIS — M79604 Pain in right leg: Secondary | ICD-10-CM

## 2016-04-07 DIAGNOSIS — M79605 Pain in left leg: Secondary | ICD-10-CM

## 2016-04-07 NOTE — Progress Notes (Signed)
Office Visit Note   Patient: Lauren Garrett           Date of Birth: May 29, 1945           MRN: [id number] Visit Date: 04/07/2016              Requested by: Haywood Pao, MD [address] [place], [place] [address] PCP: Haywood Pao, MD   Assessment & Plan: Visit Diagnoses:  1. Bilateral leg pain   Multifactorial etiology for bilateral leg pain including chronic problem with the lumbar spine followed by Dr. Christella Noa. MRI scan demonstrates mild bilateral hip osteoarthritis that does not appear to be changed from prior studies. 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 Radiology. Plan: CT scan left knee as above. Bilateral over knee supports from [employer]. EMGs and nerve conduction studies to be scheduled per Dr. Christella Noa. Follow-up after the above This is Dobner will also need bilateral pullover knee supports. She would like to see Dr. Maree Erie at [facility] radiology for bilateral knee injections. She might be a candidate for Visco supplementation.  I do not think at this point that she is a candidate for knee replacement or a lesser procedure like arthroscopic debridement.  I also think that at some point she needs to have an independent medical evaluation as she has so many issues Follow-Up Instructions: Return for after [facility] and NCV's.   Orders:  No orders of the defined types were placed in this encounter.  No orders of the defined types were placed in this encounter.     Procedures: No procedures performed   Clinical Data: No additional findings.   Subjective: Mrs. Truglio has been followed on multiple occasions over many years for multiple complaints. Recently she's been extruded balancing more trouble with both lower extremities including her hips or knees and her ankles. I have ordered an MRI scan of her pelvis and both knees.. The left knee MRI  scan reveals a chronic complex tear of the lateral meniscus and associated severe lateral compartment osteoarthritis. 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 The CT scan has been recommended to further assess the lesion. There was mild medial and patellofemoral osteoarthritis.  MRI scan of the right knee demonstrates a lateral meniscal body horizontal tear and a possible horizontal tear of the medial meniscus. The patellofemoral arthritis present at and as well as lateral compartment cartilage abnormalities.  MRI scan of the pelvis reveal minimal right hip joint effusion and minimal bilateral hip osteoarthritis. The most striking abnormality of the lumbar spine reveal severe degenerative and multilevel central canal and neural foraminal stenosis  I also have a note from Dr. Lacy Duverney office. He is presently evaluating her lumbar spine and awaiting further orthopedic evaluation.  I find it very hard to localize her problem as she has some many complaints so many locations.. Today, she complains of back hip bilateral lower extremity pain with weakness in both of her legs and bilateral knee pain. yHPI  Review of Systems   Objective: Vital Signs: There were no vitals taken for this visit.  Physical Exam  Ortho Exam straight leg raise is negative bilaterally. Increased valgus both knees more so left than right. Small effusion left knee. Diffuse tenderness everywhere I touch them both of her knees. No pain with range of motion of either hip.   Specialty  Comments:  No specialty comments available.  Imaging: No results found.   PMFS History: Patient Active Problem List   Diagnosis Date Noted  . 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 Dizziness and giddiness 09/27/2015  . Tension headache 03/23/2013  . Disturbance of skin sensation 02/02/2013  . H/O cervical spine surgery 02/02/2013  . Degeneration of lumbar or lumbosacral intervertebral disc  02/02/2013  . Generalized anxiety disorder 02/02/2013  . Hypercholesteremia    Past Medical History:  Diagnosis Date  . Arthritis   . Chronic pain   . Headache   . Hypercholesteremia   . Hypertension   . Spinal stenosis   . Thyroid disease     Family History  Problem Relation Age of Onset  . Dementia Mother   . Lung cancer Father   . Migraines Daughter     Past Surgical History:  Procedure Laterality Date  . NECK SURGERY     [id number]   Social History   Occupational History  . Rtired  Retired   Social History Main Topics  . Smoking status: Never Smoker  . Smokeless tobacco: Never Used  . Alcohol use No  . Drug use: No  . Sexual activity: Not on file

## 2016-05-12 ENCOUNTER — Telehealth: Payer: Self-pay

## 2016-05-12 NOTE — Telephone Encounter (Signed)
FYI-Patient called stated she is needing a Nerve conduction study done prior to surgery .  Patient continued to say that it is under Workers Comp, I then advised patient we do not accept Workers Comp.  Patient insist I go above our office policy to allow her to have procedure done.  Again patient advised we do not accept Workers Comp, but failed to end conversation and asked I speak to someone to allow patient to have procedure done even if she has to pay out of pocket for procedure.  Before ending call patient was advised several times of our office policy and that we do not accept Workers Comp.

## 2016-05-14 ENCOUNTER — Telehealth: Payer: Self-pay | Admitting: *Deleted

## 2016-05-14 NOTE — Telephone Encounter (Signed)
  Pt called, need to be seen asap. Having tingling in her feet, and tingling in rt arm. Please call asap [phone]  (Routing comment)

## 2016-05-14 NOTE — Telephone Encounter (Signed)
I have seen her for dizziness in past and she sees Dr Cyndy Freeze and Dr Durward Fortes for gait difficulty and arthritis. If this tingling  is a new problem she needs to be seen in the office as anew consult by next available provider not necessarily by me

## 2016-05-14 NOTE — Telephone Encounter (Signed)
Dr.Sethi see last office note. PT was only to follow up as needed. See last office note. Pt is also on workers comp too. Thanks

## 2016-05-15 NOTE — Telephone Encounter (Addendum)
Pt called back, msg relayed. Pt was advised again [facility] does not accept workers comp. Pt is disappointed she cannot have NCV/EMG at [facility].  Pt is wanting to be seen for numbness/tingling in toes. She was advised to call Dr Christella Noa or PCP to advise her as to where she should be referred. I tried to explain to the pt we do not accept workers comp for 24 minutes

## 2016-05-15 NOTE — Telephone Encounter (Signed)
IF patients call back she needs to get a new referral for tingling and numbness. Pt sees Dr. Cyndy Freeze and Dr. Garnette Czech for spine and leg an joint pain. Dr. Leonie Man only saw her for dizzy spells per his note. ALso per orthopedic md  Note they are handling her emg and nerve conduction test. ALso we cannot order and test under workers compensation per [employer].   Rn left this on patients vm at home.

## 2016-07-10 ENCOUNTER — Other Ambulatory Visit: Payer: Self-pay | Admitting: Neurosurgery

## 2016-07-10 ENCOUNTER — Telehealth (INDEPENDENT_AMBULATORY_CARE_PROVIDER_SITE_OTHER): Payer: Self-pay | Admitting: Orthopaedic Surgery

## 2016-07-10 DIAGNOSIS — M48062 Spinal stenosis, lumbar region with neurogenic claudication: Secondary | ICD-10-CM

## 2016-07-10 NOTE — Telephone Encounter (Signed)
[name] (nurse case manager) called and says patient is wanting an MRI of her ankle ordered. If that is ok, Eustaquio Maize is requesting a copy of the order to be faxed to her so she can process it on her end. Please advise.   Fax# [phone] cb# [phone]

## 2016-07-10 NOTE — Telephone Encounter (Signed)
LVMOM for Beth to cal me for a meeting with PW

## 2016-07-10 NOTE — Telephone Encounter (Signed)
See note below

## 2016-07-10 NOTE — Telephone Encounter (Signed)
Lauren Garrett returned Lauren Garrett's call. She will not be available after 2:45 today, but she will call back tomorrow to speak to Lauren Garrett unless she hears from Lauren Garrett before 2:45 this afternoon.

## 2016-07-10 NOTE — Telephone Encounter (Signed)
Ok to order 

## 2016-07-16 ENCOUNTER — Telehealth: Payer: Self-pay | Admitting: Orthopedic Surgery

## 2016-07-21 ENCOUNTER — Ambulatory Visit
Admission: RE | Admit: 2016-07-21 | Discharge: 2016-07-21 | Disposition: A | Discharge status: Home / Self Care | Payer: Worker's Compensation | Source: Ambulatory Visit | Attending: Neurosurgery | Admitting: Neurosurgery

## 2016-07-21 ENCOUNTER — Other Ambulatory Visit: Payer: Self-pay

## 2016-07-21 DIAGNOSIS — M48062 Spinal stenosis, lumbar region with neurogenic claudication: Secondary | ICD-10-CM

## 2016-07-21 MED ORDER — IOPAMIDOL (ISOVUE-M 200) INJECTION 41%
1.0000 mL | Freq: Once | INTRAMUSCULAR | Status: AC
  Administered 2016-07-21: 1 mL via EPIDURAL

## 2016-07-21 MED ORDER — METHYLPREDNISOLONE ACETATE 40 MG/ML INJ SUSP (RADIOLOG
120.0000 mg | Freq: Once | INTRAMUSCULAR | Status: AC
  Administered 2016-07-21: 120 mg via EPIDURAL

## 2016-07-21 NOTE — Discharge Instructions (Signed)

## 2016-07-28 IMAGING — DX DG ANKLE COMPLETE 3+V*L*
3 series · 3 of 3 positions shown · non-contrast
Comparison: Foot films of same date and 01/05/2013.

CLINICAL DATA: Pain and bruising on anterior surface of foot
extending to second toe with numbness in toe. No injury. Possible
bite.

EXAM:
LEFT ANKLE COMPLETE - 3+ VIEW

[ankle ap]
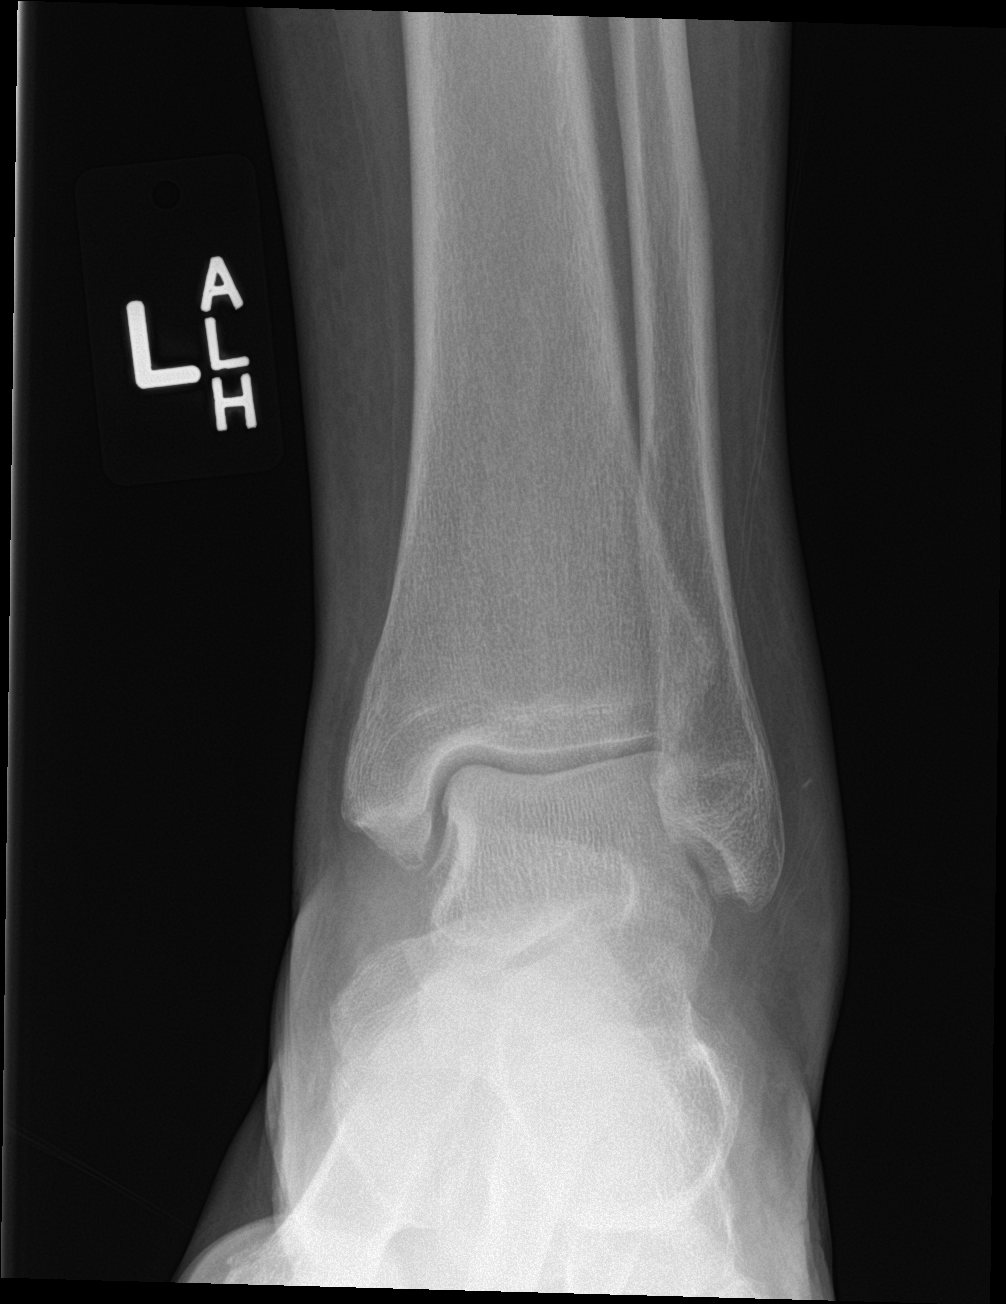

[ankle obl]
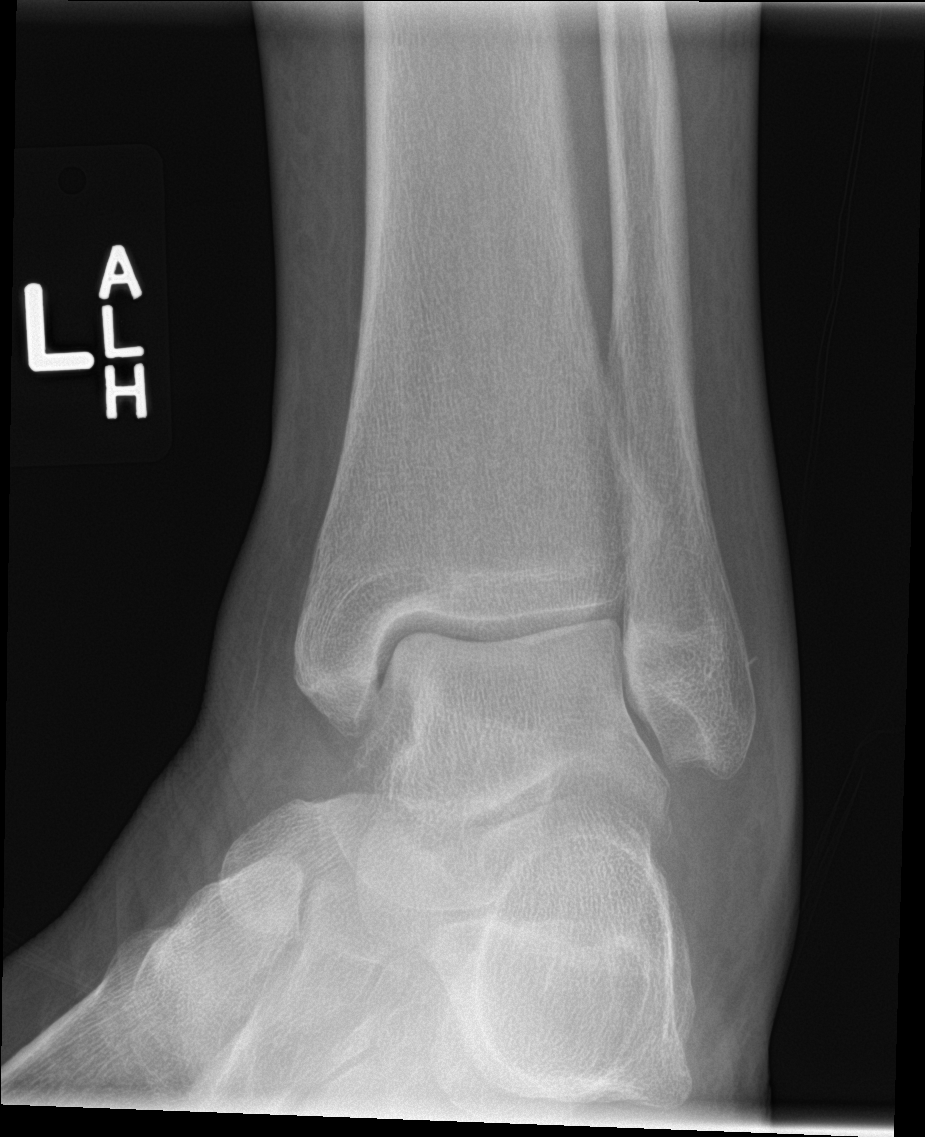

[ankle lat]
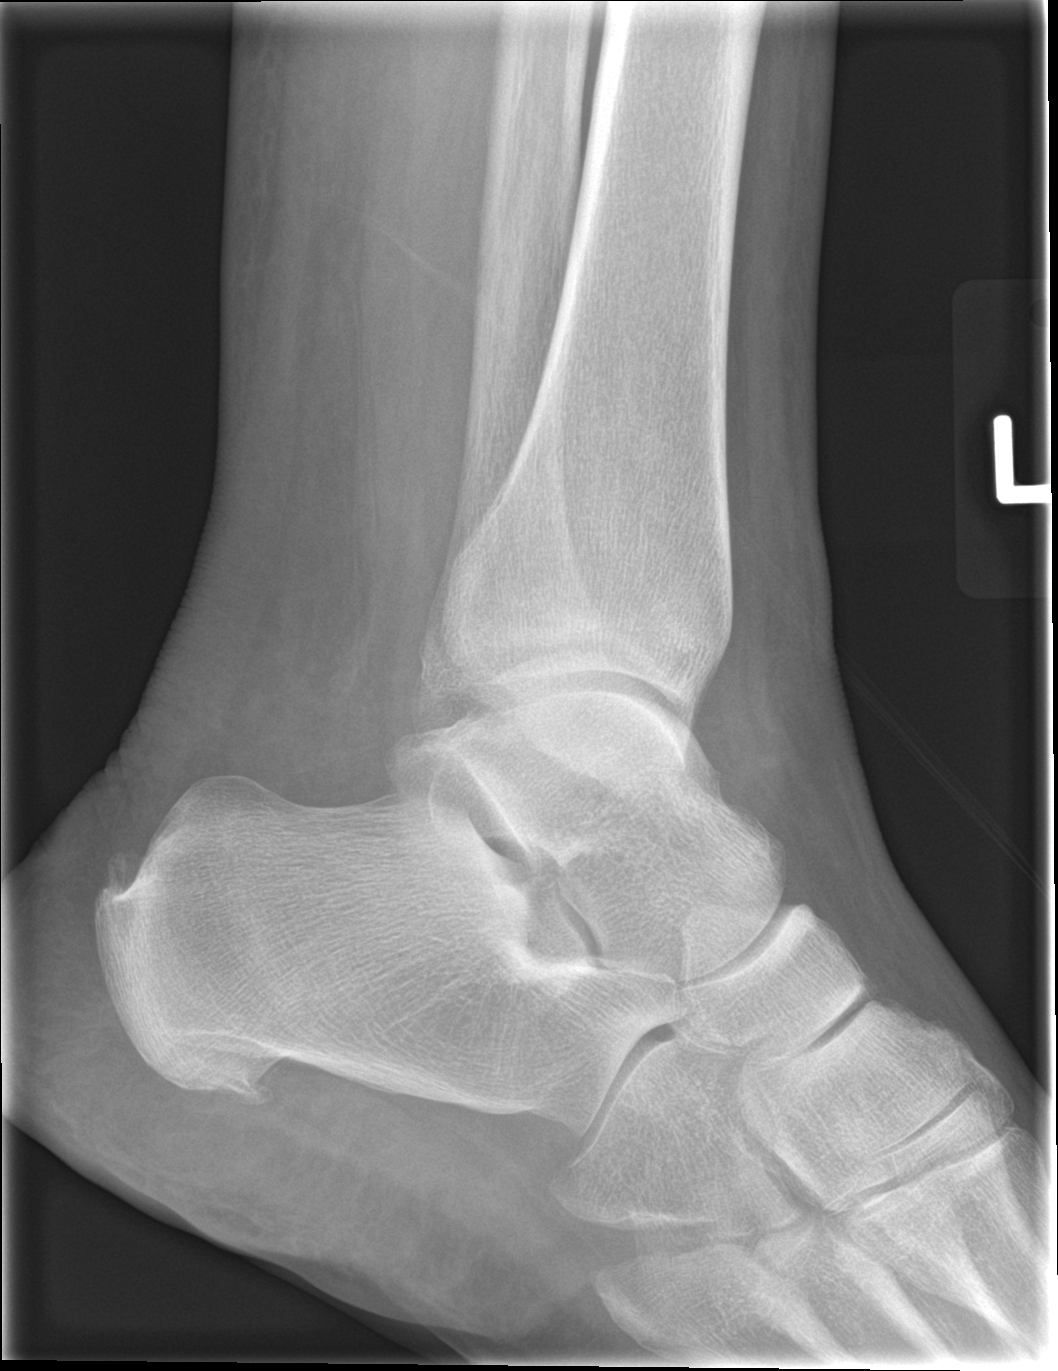

[3 of 3 positions shown; findings below may reference images not displayed]

FINDINGS: Mild diffuse soft tissue swelling. No acute fracture or dislocation.
Small Achilles and calcaneal spurs. Favor artifact over radiopaque
foreign body projecting adjacent the lateral malleolus on the AP and
mortise views. No soft tissue gas.
IMPRESSION: Soft tissue swelling, without acute osseous abnormality.

Favor artifact over tiny radiopaque foreign body adjacent the distal
fibula on the AP and mortise views. Not localized on the lateral
view.

## 2016-08-06 NOTE — Telephone Encounter (Signed)
Error

## 2016-08-11 ENCOUNTER — Encounter (INDEPENDENT_AMBULATORY_CARE_PROVIDER_SITE_OTHER): Payer: Self-pay | Admitting: Orthopaedic Surgery

## 2016-08-11 ENCOUNTER — Ambulatory Visit (INDEPENDENT_AMBULATORY_CARE_PROVIDER_SITE_OTHER): Payer: Worker's Compensation | Admitting: Orthopaedic Surgery

## 2016-08-11 VITALS — Ht 67.0 in | Wt 150.0 lb

## 2016-08-11 DIAGNOSIS — M25572 Pain in left ankle and joints of left foot: Secondary | ICD-10-CM | POA: Diagnosis not present

## 2016-08-11 DIAGNOSIS — M25562 Pain in left knee: Secondary | ICD-10-CM

## 2016-08-11 DIAGNOSIS — M25561 Pain in right knee: Secondary | ICD-10-CM | POA: Diagnosis not present

## 2016-08-11 DIAGNOSIS — M25571 Pain in right ankle and joints of right foot: Secondary | ICD-10-CM | POA: Diagnosis not present

## 2016-08-11 DIAGNOSIS — G8929 Other chronic pain: Secondary | ICD-10-CM | POA: Diagnosis not present

## 2016-08-11 NOTE — Progress Notes (Signed)
Office Visit Note   Patient: Lauren Garrett           Date of Birth: [birth year]           MRN: [id number] Visit Date: 08/11/2016              Requested by: Haywood Pao, MD [address] [place], [place] [address] PCP: Haywood Pao, MD   Assessment & Plan: Visit Diagnoses:  1. Pain in left ankle and joints of left foot   2. Pain in right ankle and joints of right foot   3. Chronic pain of both knees   Lauren Garrett was seen today primarily to evaluate both of her knees. She has significant and advanced osteoarthritis bilaterally with a possible loose body in her left knee.  Plan: Cortisone injection left knee. Return to office in 2 weeks to consider cortisone injection of right knee. Consider Visco supplementation over time. Left knee is more symptomatic which might be in part related to the loose body. We previously had discussed MRI scan of both ankles which have not been performed. We will check on that and plan to see her back in 2 weeks to inject the opposite knee or to evaluate MRI scans. Office visit nearly 1 hour discussing all the above with Mr.and Mrs. Mosley and with the rehabilitation nurse  Follow-Up Instructions: Return in about 2 weeks (around 08/25/2016), or or mafter MRI both ankles.   Orders:  Orders Placed This Encounter  Procedures  . MR Ankle Right w/o contrast  . MR Ankle Left w/o contrast   No orders of the defined types were placed in this encounter.     Procedures: No procedures performed   Clinical Data: No additional findings.   Subjective: Chief Complaint  Patient presents with  . Left Knee - Results, Pain    Lauren Garrett is a 71 y o that is here to go over her CT results of Left knee.  Lauren Garrett is seen today specifically to evaluate both of her knees she had an MRI scan of the left knee in October demonstrating a, complex tear and maceration of the lateral meniscus and associated severe lateral compartment osteoarthritis. She  also had a 3.7 cm chondroid lesion of the distal femoral diaphysis consistent with an enchondroma . They suggested further assessment with a CT scan. That was performed in February. the lesion appeared grossly similar in appearance to the prior MRI scan of October. They suggested serial examinations to ensure stability and to exclude a more aggressive lesion. The tricompartmental osteoarthritis greatest in the lateral compartment was also demonstrated. An intra-articular body medially measured up to 9 mm. The patient is more symptomatic laterally. MRI scan of the right knee was also performed in October 2017 demonstrating possible lateral meniscus body horizontal tear and a horizontal tear of the medial meniscus. Also demonstrated was patellofemoral greater than lateral compartment osteoarthritis. Scans of both ankles had been ordered but have not been performed to date. Lauren Garrett has a number of braces that she uses for both of her knees which seemed to provide some stability.  HPI  Review of Systems   Objective: Vital Signs: Ht 5\' 7"  (1.702 m)   Wt 150 lb (68 kg)   BMI 23.49 kg/m   Physical Exam  Ortho Exam right knee with slight increased valgus with weightbearing. No effusion. Predominantly lateral joint pain. Some patella crepitation. No obvious instability. Some fullness in the popliteal space. No calf pain. No  distal edema. Flexed over 105. Left knee with small effusion. Predominantly lateral joint pain and crepitation associated with increased valgus. Small effusion. Significant patella crepitation. Popliteal fullness probably consistent with popliteal cyst. No calf pain. Neurovascular exam appeared to be intact . Some loss of full extension and overall 105 of flexion  Specialty Commed specifically there was high-grade cartilage loss involving the patella with near full-thickness components there were small cartilage fissures of the lateral femoral condyle with subchondral edema. nts:    No specialty comments available.  Imaging: No results found.   PMFS History: Patient Active Problem List   Diagnosis Date Noted  . 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 Dizziness and giddiness 09/27/2015  . Tension headache 03/23/2013  . Disturbance of skin sensation 02/02/2013  . H/O cervical spine surgery 02/02/2013  . Degeneration of lumbar or lumbosacral intervertebral disc 02/02/2013  . Generalized anxiety disorder 02/02/2013  . Hypercholesteremia    Past Medical History:  Diagnosis Date  . Arthritis   . Chronic pain   . Headache   . Hypercholesteremia   . Hypertension   . Spinal stenosis   . Thyroid disease     Family History  Problem Relation Age of Onset  . Dementia Mother   . Lung cancer Father   . Migraines Daughter     Past Surgical History:  Procedure Laterality Date  . NECK SURGERY     [id number]   Social History   Occupational History  . Rtired  Retired   Social History Main Topics  . Smoking status: Never Smoker  . Smokeless tobacco: Never Used  . Alcohol use No  . Drug use: No  . Sexual activity: Not on file     Garald Balding, MD   Note - This record has been created using [employer].  Chart creation errors have been sought, but may not always  have been located. Such creation errors do not reflect on  the standard of medical care.

## 2016-08-12 ENCOUNTER — Telehealth (INDEPENDENT_AMBULATORY_CARE_PROVIDER_SITE_OTHER): Payer: Self-pay | Admitting: *Deleted

## 2016-08-12 NOTE — Telephone Encounter (Signed)
Pt has appt for MRI of both ankles at [facility] on Thurs April 26th at 4:30pm, pt is to arrive at 4:15pm to check in. Left message on home to return call for appt, vm not set up on cell.

## 2016-08-14 NOTE — Telephone Encounter (Signed)
Pt returned my call but was not able to make appt for today d/t being OOT, pt is r/s for Mon April 30 at 3p, pt is aware to arrive at 230p.

## 2016-08-18 ENCOUNTER — Other Ambulatory Visit: Payer: Self-pay | Admitting: Internal Medicine

## 2016-08-18 DIAGNOSIS — Z1231 Encounter for screening mammogram for malignant neoplasm of breast: Secondary | ICD-10-CM

## 2016-09-10 ENCOUNTER — Other Ambulatory Visit (HOSPITAL_COMMUNITY)
Admission: RE | Admit: 2016-09-10 | Discharge: 2016-09-10 | Disposition: A | Discharge status: Home / Self Care | Payer: Medicare Other | Source: Ambulatory Visit | Attending: Obstetrics and Gynecology | Admitting: Obstetrics and Gynecology

## 2016-09-10 ENCOUNTER — Other Ambulatory Visit: Payer: Self-pay | Admitting: Obstetrics and Gynecology

## 2016-09-10 DIAGNOSIS — Z124 Encounter for screening for malignant neoplasm of cervix: Secondary | ICD-10-CM | POA: Diagnosis present

## 2016-09-12 LAB — CYTOLOGY - PAP
Diagnosis: NEGATIVE
HPV: NOT DETECTED

## 2016-09-29 ENCOUNTER — Ambulatory Visit (INDEPENDENT_AMBULATORY_CARE_PROVIDER_SITE_OTHER): Payer: Worker's Compensation | Admitting: Orthopaedic Surgery

## 2016-09-29 ENCOUNTER — Encounter (INDEPENDENT_AMBULATORY_CARE_PROVIDER_SITE_OTHER): Payer: Self-pay | Admitting: Orthopaedic Surgery

## 2016-09-29 VITALS — Ht 67.0 in | Wt 150.0 lb

## 2016-09-29 DIAGNOSIS — M545 Low back pain: Secondary | ICD-10-CM | POA: Diagnosis not present

## 2016-09-29 DIAGNOSIS — M7072 Other bursitis of hip, left hip: Secondary | ICD-10-CM | POA: Diagnosis not present

## 2016-09-29 DIAGNOSIS — M1711 Unilateral primary osteoarthritis, right knee: Secondary | ICD-10-CM | POA: Diagnosis not present

## 2016-09-29 DIAGNOSIS — M25572 Pain in left ankle and joints of left foot: Secondary | ICD-10-CM | POA: Diagnosis not present

## 2016-09-29 DIAGNOSIS — M25571 Pain in right ankle and joints of right foot: Secondary | ICD-10-CM

## 2016-09-29 MED ORDER — METHYLPREDNISOLONE ACETATE 40 MG/ML IJ SUSP
80.0000 mg | INTRAMUSCULAR | Status: AC | PRN
  Administered 2016-09-29: 80 mg

## 2016-09-29 MED ORDER — BUPIVACAINE HCL 0.5 % IJ SOLN
3.0000 mL | INTRAMUSCULAR | Status: AC | PRN
  Administered 2016-09-29: 3 mL via INTRA_ARTICULAR

## 2016-09-29 MED ORDER — LIDOCAINE HCL 1 % IJ SOLN
5.0000 mL | INTRAMUSCULAR | Status: AC | PRN
  Administered 2016-09-29: 5 mL

## 2016-09-29 NOTE — Progress Notes (Signed)
Office Visit Note   Patient: Lauren Garrett           Date of Birth: July 20, 1945           MRN: [id number] Visit Date: 09/29/2016              Requested by: Haywood Pao, MD [address] [place], [place] [address] PCP: Haywood Pao, MD   Assessment & Plan: Visit Diagnoses:  1. Other bursitis of hip, left hip   Multiple chronic problems as outlined in prior office visit notes. Today we reviewed recent MRI scan reports of both ankles. Lauren Garrett also was localizing some pain at the area of the left buttock and thigh exam has some discomfort over the iliac bursa  Plan: Consult Dr. Ernestina Patches for injection of the iliac bursa. Bilateral arch supports for pronated feet. With left ankle support for chronic sprain of anterior talofibular and fibulocalcaneal ligaments. Inject right knee today for the osteoarthritis. Follow-up with Dr. Cyndy Freeze in neurosurgery. Lauren Garrett might be developing peripheral neuropathy with tingling and burning in both feet. I will plan to see back as needed. From my standpoint I do not have any further recommendations for ongoing orthopedic care. Lauren Garrett has very mild arthritis of both hips. More moderate arthritis in both of her knees and the recent findings by MRI scan of both of her ankles. I think the biggest problem is with her lumbar spine where she has significant degenerative changes in the lower lumbar spine associated with stenosis. She is being followed by Dr. Cyndy Freeze. I think she could be followed by her general medical physician on an ongoing basis for any medical needs. Since his Satira Garrett also asked for recommendation for a podiatrist. Beat given the number to give the name of Brent. Discussed all the above with rehabilitation nurse. Total time spent over an hour  Follow-Up Instructions: Return if symptoms worsen or fail to improve.   Orders:  Orders Placed This Encounter  Procedures  . Ambulatory referral to Physical Medicine Rehab     No orders of the defined types were placed in this encounter.     Procedures: Large Joint Inj Date/Time: 09/29/2016 4:51 PM Performed by: Garald Balding Authorized by: Garald Balding   Consent Given by:  Patient Timeout: prior to procedure the correct patient, procedure, and site was verified   Indications:  Pain and joint swelling Location:  Knee Site:  R knee Prep: patient was prepped and draped in usual sterile fashion   Needle Size:  25 G Needle Length:  1.5 inches Approach:  Anteromedial Ultrasound Guidance: No   Fluoroscopic Guidance: No   Arthrogram: No   Medications:  3 mL bupivacaine 0.5 %; 5 mL lidocaine 1 %; 80 mg methylPREDNISolone acetate 40 MG/ML Aspiration Attempted: No   Patient tolerance:  Patient tolerated the procedure well with no immediate complications      Clinical Data: No additional findings.   Subjective: Chief Complaint  Patient presents with  . Right Ankle - Results  . Left Ankle - Results  MRI scan of both ankles were recently obtained through no follow-up health. On the right ankle she had subchondral cyst versus early osteochondral abnormalities of the medial talar dome. There were small degenerative or posttraumatic tibiotalar and subtalar joint effusions with mild reactive or posttraumatic fluid systems tarsal sinus. She also had mild plantar fasciitis. On the left ankle she had a chronic tear of the anterior talofibular ligament. Mild calcaneal  spurring. I'm not sure that any different from prior MRI scans. She relates that the therapist suggested she might want to get inserts for both of her feet because of her pronation that certainly could affect her knees I would agree. She also mentioned a problem that she had over the past year with a significant injury where she had an abduction and internal rotation of her left leg and still having some tenderness in the area of her left buttock. She's only has some tenderness over the iliac  area of her buttock but no increased pain in the area hip knee or ankle and the left lower extremity  HPI  Review of Systems   Objective: Vital Signs: Ht 5\' 7"  (1.702 m)   Wt 150 lb (68 kg)   BMI 23.49 kg/m   Physical Exam  Ortho Exam straight leg raise negative bilaterally. Painless range of motion with internal and external rotation both hips.  Valgus position of both knees with weightbearing associated with bilateral foot pronation. No obvious swelling distally does complain of tingling and burning in both of her feet which could be an early neuropathy. She'll be seeing Dr. Cyndy Freeze the very near future. Good pulses. Bilateral knee pain without effusion more pain laterally than medially. Some patellar crepitation. No calf discomfort. Does have some localized tenderness over the ischial tuberosity left hip  Imaging: No results found.   PMFS History: Patient Active Problem List   Diagnosis Date Noted  . 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 Dizziness and giddiness 09/27/2015  . Tension headache 03/23/2013  . Disturbance of skin sensation 02/02/2013  . H/O cervical spine surgery 02/02/2013  . Degeneration of lumbar or lumbosacral intervertebral disc 02/02/2013  . Generalized anxiety disorder 02/02/2013  . Hypercholesteremia    Past Medical History:  Diagnosis Date  . Arthritis   . Chronic pain   . Headache   . Hypercholesteremia   . Hypertension   . Spinal stenosis   . Thyroid disease     Family History  Problem Relation Age of Onset  . Dementia Mother   . Lung cancer Father   . Migraines Daughter     Past Surgical History:  Procedure Laterality Date  . NECK SURGERY     [id number]   Social History   Occupational History  . Rtired  Retired   Social History Main Topics  . Smoking status: Never Smoker  . Smokeless tobacco: Never Used  . Alcohol use No  . Drug use: No  . Sexual activity: Not on file     Garald Balding, MD   Note - This record has been created using  [employer].  Chart creation errors have been sought, but may not always  have been located. Such creation errors do not reflect on  the standard of medical care.

## 2016-10-01 ENCOUNTER — Other Ambulatory Visit: Payer: Self-pay | Admitting: Obstetrics and Gynecology

## 2016-10-01 DIAGNOSIS — Z1231 Encounter for screening mammogram for malignant neoplasm of breast: Secondary | ICD-10-CM

## 2016-10-10 ENCOUNTER — Ambulatory Visit
Admission: RE | Admit: 2016-10-10 | Discharge: 2016-10-10 | Disposition: A | Discharge status: Home / Self Care | Payer: Medicare Other | Source: Ambulatory Visit | Attending: Obstetrics and Gynecology | Admitting: Obstetrics and Gynecology

## 2016-10-10 DIAGNOSIS — Z1231 Encounter for screening mammogram for malignant neoplasm of breast: Secondary | ICD-10-CM

## 2016-10-13 ENCOUNTER — Telehealth (INDEPENDENT_AMBULATORY_CARE_PROVIDER_SITE_OTHER): Payer: Self-pay | Admitting: Orthopaedic Surgery

## 2016-10-13 NOTE — Telephone Encounter (Signed)
Patient called needing copy of last few notes so that she can take with her to appt. With Dr. Christella Noa today. I've printed and have ready for her to pick up at the front desk.

## 2016-11-10 ENCOUNTER — Ambulatory Visit (INDEPENDENT_AMBULATORY_CARE_PROVIDER_SITE_OTHER): Payer: Self-pay | Admitting: Physical Medicine and Rehabilitation

## 2016-11-17 ENCOUNTER — Ambulatory Visit (INDEPENDENT_AMBULATORY_CARE_PROVIDER_SITE_OTHER): Payer: Worker's Compensation | Admitting: Physical Medicine and Rehabilitation

## 2016-11-17 ENCOUNTER — Ambulatory Visit (INDEPENDENT_AMBULATORY_CARE_PROVIDER_SITE_OTHER): Payer: Worker's Compensation

## 2016-11-17 DIAGNOSIS — M7072 Other bursitis of hip, left hip: Secondary | ICD-10-CM | POA: Diagnosis not present

## 2016-11-17 NOTE — Progress Notes (Deleted)
Left hip and buttock pain. Shooting pain down leg to knee and down to ankle at times when sitting.

## 2016-11-17 NOTE — Patient Instructions (Signed)

## 2016-11-17 NOTE — Progress Notes (Signed)
[name] - 71 y.o. female MRN [id number]  Date of birth: [birth year]  Office Visit Note: Visit Date: 11/17/2016 PCP: Haywood Pao, MD Referred by: Haywood Pao, MD  Subjective: Chief Complaint  Patient presents with  . Left Hip - Pain   HPI: [name] is a 71 year old female that I have seen in the past. She is followed by Dr. Joni Fears and Dr. Cyndy Freeze at [facility]. Dr. Durward Fortes kindly referred the patient to me for left ischial bursa injection diagnostically and hopefully therapeutically with fluoroscopic guidance. The patient had a multitude of somatic pain complaints today including the low back and left hip and leg as well as numbness tingling and paresthesias as well as tingling in the hands. She reports that an electrodiagnostic study has been ordered by Dr. Cyndy Freeze but she wants the study to include the upper extremities as well. I've asked her to follow up with Dr. Cyndy Freeze considering exactly what he would like from that. We told her that would need to be approved by her Worker's Compensation.    ROS Otherwise per HPI.  Assessment & Plan: Visit Diagnoses:  1. Ischial bursitis of left side     Plan: Findings:  Left initial bursa injection with fluoroscopic guidance. The patient did report being able to sit easier after the injection.    Meds & Orders: No orders of the defined types were placed in this encounter.   Orders Placed This Encounter  Procedures  . Large Joint Injection/Arthrocentesis  . XR C-ARM NO REPORT    Follow-up: Return if symptoms worsen or fail to improve, for Dr. Mack Hook.   Procedures: Initial bursa injection with fluoroscopic guidance. Date/Time: 11/17/2016 1:12 PM Performed by: Magnus Sinning Authorized by: Magnus Sinning   Consent Given by:  Patient Site marked: the procedure site was marked   Timeout: prior to procedure the correct patient, procedure, and site was  verified   Indications:  Pain and diagnostic evaluation Location:  Hip Hip joint: L. ischium. Prep: patient was prepped and draped in usual sterile fashion   Needle Size:  22 G Needle Length:  3.5 inches Approach:  Anterior Ultrasound Guidance: No   Fluoroscopic Guidance: Yes   Arthrogram: No   Medications:  3 mL bupivacaine 0.5 %; 80 mg triamcinolone acetonide 40 MG/ML Aspiration Attempted: Yes   Patient tolerance:  Patient tolerated the procedure well with no immediate complications  There was excellent flow of contrast producing a partial arthrogram of the hip. The patient did have relief of symptoms during the anesthetic phase of the injection.     No notes on file   Clinical History: No specialty comments available.  She reports that she has never smoked. She has never used smokeless tobacco. No results for input(s): HGBA1C, LABURIC in the last 8760 hours.  Objective:  VS:  HT:    WT:   BMI:     BP:   HR: bpm  TEMP: ( )  RESP:  Physical Exam  Musculoskeletal:  Patient is slow to rise from a seated position but has good distal strength. She does have pain over the left ischial bursa.    Ortho Exam Imaging: No results found.  Past Medical/Family/Surgical/Social History: Medications & Allergies reviewed per EMR Patient Active Problem List   Diagnosis Date Noted  . 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 Dizziness and giddiness 09/27/2015  . Tension headache 03/23/2013  . Disturbance of skin sensation 02/02/2013  . H/O cervical spine surgery  02/02/2013  . Degeneration of lumbar or lumbosacral intervertebral disc 02/02/2013  . Generalized anxiety disorder 02/02/2013  . Hypercholesteremia    Past Medical History:  Diagnosis Date  . Arthritis   . Chronic pain   . Headache   . Hypercholesteremia   . Hypertension   . Spinal stenosis   . Thyroid disease    Family History  Problem Relation Age of Onset  . Dementia Mother   . Lung cancer Father   . Migraines Daughter   .  Breast cancer Neg Hx    Past Surgical History:  Procedure Laterality Date  . NECK SURGERY     [id number]   Social History   Occupational History  . Rtired  Retired   Social History Main Topics  . Smoking status: Never Smoker  . Smokeless tobacco: Never Used  . Alcohol use No  . Drug use: No  . Sexual activity: Not on file

## 2016-11-17 NOTE — Progress Notes (Unsigned)
Fluoro Time: 11 sec MGY: 3.98

## 2016-11-19 MED ORDER — BUPIVACAINE HCL 0.5 % IJ SOLN
3.0000 mL | INTRAMUSCULAR | Status: AC | PRN
Start: 2016-11-17 — End: 2016-11-17
  Administered 2016-11-17: 3 mL via INTRA_ARTICULAR

## 2016-11-19 MED ORDER — TRIAMCINOLONE ACETONIDE 40 MG/ML IJ SUSP
80.0000 mg | INTRAMUSCULAR | Status: AC | PRN
  Administered 2016-11-17: 80 mg via INTRA_ARTICULAR

## 2016-11-25 ENCOUNTER — Telehealth (INDEPENDENT_AMBULATORY_CARE_PROVIDER_SITE_OTHER): Payer: Self-pay

## 2016-11-25 NOTE — Telephone Encounter (Signed)
Faxed the 11/17/16 procedure note to case mgr per her request

## 2016-12-10 ENCOUNTER — Encounter (INDEPENDENT_AMBULATORY_CARE_PROVIDER_SITE_OTHER): Payer: Self-pay | Admitting: Physical Medicine and Rehabilitation

## 2016-12-10 ENCOUNTER — Ambulatory Visit (INDEPENDENT_AMBULATORY_CARE_PROVIDER_SITE_OTHER): Payer: Worker's Compensation | Admitting: Physical Medicine and Rehabilitation

## 2016-12-10 DIAGNOSIS — M48062 Spinal stenosis, lumbar region with neurogenic claudication: Secondary | ICD-10-CM

## 2016-12-10 DIAGNOSIS — R202 Paresthesia of skin: Secondary | ICD-10-CM | POA: Diagnosis not present

## 2016-12-10 DIAGNOSIS — R531 Weakness: Secondary | ICD-10-CM

## 2016-12-10 NOTE — Telephone Encounter (Signed)
Call opened in error.

## 2016-12-10 NOTE — Progress Notes (Deleted)
Numbness in toes. Bilateral ankle pain, bilateral knee pain. Tingling in legs. Numbness in ball of foot. Left ankle swells.

## 2016-12-17 ENCOUNTER — Encounter (INDEPENDENT_AMBULATORY_CARE_PROVIDER_SITE_OTHER): Payer: Self-pay | Admitting: Physical Medicine and Rehabilitation

## 2016-12-17 ENCOUNTER — Ambulatory Visit (INDEPENDENT_AMBULATORY_CARE_PROVIDER_SITE_OTHER): Payer: Worker's Compensation | Admitting: Physical Medicine and Rehabilitation

## 2016-12-17 DIAGNOSIS — R202 Paresthesia of skin: Secondary | ICD-10-CM | POA: Diagnosis not present

## 2016-12-17 NOTE — Progress Notes (Deleted)
Right hand dominant. "Prickly" feeling in fingers which comes and goes. Feels like it happens in all fingers.

## 2016-12-18 ENCOUNTER — Telehealth (INDEPENDENT_AMBULATORY_CARE_PROVIDER_SITE_OTHER): Payer: Self-pay | Admitting: Physical Medicine and Rehabilitation

## 2016-12-23 NOTE — Progress Notes (Signed)
EMMILYN CROOKE - 71 y.o. female MRN [id number]  Date of birth: [birth year]  Office Visit Note: Visit Date: 12/17/2016 PCP: Haywood Pao, MD Referred by: Haywood Pao, MD  Subjective: Chief Complaint  Patient presents with  . Right Hand - Numbness  . Left Hand - Numbness   HPI: Mrs. Honeycutt is a 71 year old female with chronic pain syndrome and chronic pain history with multiple somatic complaints and multiple evaluations for pain and injury and Worker's Compensation injuries. She comes in today for history of chronic worsening bilateral fingertip numbness. She reports a "prickly" sensation in the fingertips on both hands. This can be fairly constant at times but can be intermittent. She has not noted with any specific movement of the hands. She does not note any numbness tingling or paresthesias in any specific dermatomal patterns. She denies any frank radicular pain shooting down the arms. She has no history of peripheral polyneuropathy and does not have diabetes. I recently saw her for lower extremity electrodiagnostic study which was interesting in the fact that I had seen her in 2011 and completed similar electrodiagnostic study of the bilateral lower limbs that revealed evidence of radiculopathies consistent with lumbar stenosis. However, the electrodiagnostic study performed of the lower limbs last week was difficult to interpret and still included evidence possibly of lumbar stenosis but also could not rule out peripheral polyneuropathy of the lower limbs. She had somewhat worsening changes on the left than right however. She does have worsening symptoms on the left leg. Since I have last seen her she has had an MRI of the lumbar spine repeated. This again shows severe stenosis at L3-4 L4-5 and severe lateral recess stenosis on the left at L5-S1. The MRI is reviewed below and detail.  As usual, Mrs. Soloway offers up a few more complaints today. She shows me the great toes  bilaterally and ask about the nails on both toes. It looks to me like there is onychomycosis bilaterally. She relates a history of many years ago being told there is a stress fracture of the second toe on the left. I did offer that she could see one of the foot and ankle specialist in our office, Dr. Sharol Given. She has seen podiatrist in the past. I did tell her these details and complaints are not really germane to her tingling in the fingertips or the prior workup by Dr. Cyndy Freeze for lumbar stenosis.    ROS Otherwise per HPI.  Assessment & Plan: Visit Diagnoses:  1. Paresthesia of skin     Plan: No additional findings.  Impression: Essentially NORMAL electrodiagnostic study of both upper limbs.    There is no significant electrodiagnostic evidence of nerve entrapment, brachial plexopathy, cervical radiculopathy or generalized peripheral neuropathy.    As you know, purely sensory or demyelinating radiculopathies and chemical radiculitis may not be detected with this particular electrodiagnostic study.  This electrodiagnostic study cannot rule out small fiber polyneuropathy and dysesthesias from central pain sensitization syndromes such as fibromyalgia.  Recommendations: 1.  Follow-up with referring physician. In terms of her low back and leg complaints she does have severe stenosis at multiple levels and I do think the electrodiagnostic study of the lower limbs performed last week goes along with this MRI finding and clinically. However, I cannot completely rule out a peripheral polyneuropathy worse in the lower limbs and for this the patient wants to see her neurologist who has been treating her headaches, Dr. Leonie Man. The patient could see him directly  but the patient does want a referral to him. This was placed and they may want to do electrodiagnostic study.   2.  Continue current management of symptoms.   Meds & Orders: No orders of the defined types were placed in this encounter.   Orders  Placed This Encounter  Procedures  . Ambulatory referral to Neurology  . NCV with EMG (electromyography)    Follow-up: Return for Dr. Larose Hires.   Procedures: No procedures performed  EMG & NCV Findings: All nerve conduction studies (as indicated in the following tables) were within normal limits.  All left vs. right side differences were within normal limits.    All examined muscles (as indicated in the following table) showed no evidence of electrical instability.    Impression: Essentially NORMAL electrodiagnostic study of both upper limbs.    There is no significant electrodiagnostic evidence of nerve entrapment, brachial plexopathy, cervical radiculopathy or generalized peripheral neuropathy.    As you know, purely sensory or demyelinating radiculopathies and chemical radiculitis may not be detected with this particular electrodiagnostic study.  This electrodiagnostic study cannot rule out small fiber polyneuropathy and dysesthesias from central pain sensitization syndromes such as fibromyalgia.  Recommendations: 1.  Follow-up with referring physician. 2.  Continue current management of symptoms.    Nerve Conduction Studies Anti Sensory Summary Table   Stim Site NR Peak (ms) Norm Peak (ms) P-T Amp (V) Norm P-T Amp Site1 Site2 Delta-P (ms) Dist (cm) Vel (m/s) Norm Vel (m/s)  Left Median Acr Palm Anti Sensory (2nd Digit)  32.5C  Wrist    3.6 <3.6 17.8 >10 Wrist Palm 1.9 0.0    Palm    1.7 <2.0 19.2         Right Median Acr Palm Anti Sensory (2nd Digit)  34C  Wrist    3.3 <3.6 41.7 >10 Wrist Palm 1.8 0.0    Palm    1.5 <2.0 11.4         Left Radial Anti Sensory (Base 1st Digit)  32.6C  Wrist    2.3 <3.1 40.3  Wrist Base 1st Digit 2.3 0.0    Right Radial Anti Sensory (Base 1st Digit)  33.3C  Wrist    2.3 <3.1 54.7  Wrist Base 1st Digit 2.3 0.0    Left Ulnar Anti Sensory (5th Digit)  32.8C  Wrist    3.1 <3.7 22.4 >15.0 Wrist 5th Digit 3.1 14.0 45 >38  Right Ulnar Anti  Sensory (5th Digit)  34.6C  Wrist    2.9 <3.7 29.4 >15.0 Wrist 5th Digit 2.9 14.0 48 >38   Motor Summary Table   Stim Site NR Onset (ms) Norm Onset (ms) O-P Amp (mV) Norm O-P Amp Site1 Site2 Delta-0 (ms) Dist (cm) Vel (m/s) Norm Vel (m/s)  Left Median Motor (Abd Poll Brev)  32.7C  Wrist    3.4 <4.2 7.3 >5 Elbow Wrist 4.1 22.0 54 >50  Elbow    7.5  6.7         Right Median Motor (Abd Poll Brev)  32.6C  Wrist    3.3 <4.2 7.3 >5 Elbow Wrist 4.2 23.0 55 >50  Elbow    7.5  3.3         Left Ulnar Motor (Abd Dig Min)  32.7C  Wrist    3.0 <4.2 4.2 >3 B Elbow Wrist 4.0 22.0 55 >53  B Elbow    7.0  3.8  A Elbow B Elbow 1.8 10.0 56 >53  A Elbow  8.8  2.6         Right Ulnar Motor (Abd Dig Min)  32.5C  Wrist    2.8 <4.2 5.6 >3 B Elbow Wrist 3.9 23.0 59 >53  B Elbow    6.7  5.2  A Elbow B Elbow 1.5 10.0 67 >53  A Elbow    8.2  5.1          EMG   Side Muscle Nerve Root Ins Act Fibs Psw Amp Dur Poly Recrt Int Fraser Din Comment  Right Abd Poll Brev Median C8-T1 Nml Nml Nml Nml Nml 0 Nml Nml   Right 1stDorInt Ulnar C8-T1 Nml Nml Nml Nml Nml 0 Nml Nml   Right PronatorTeres Median C6-7 Nml Nml Nml Nml Nml 0 Nml Nml   Right Biceps Musculocut C5-6 Nml Nml Nml Nml Nml 0 Nml Nml   Right Deltoid Axillary C5-6 Nml Nml Nml Nml Nml 0 Nml Nml     Nerve Conduction Studies Anti Sensory Left/Right Comparison   Stim Site L Lat (ms) R Lat (ms) L-R Lat (ms) L Amp (V) R Amp (V) L-R Amp (%) Site1 Site2 L Vel (m/s) R Vel (m/s) L-R Vel (m/s)  Median Acr Palm Anti Sensory (2nd Digit)  32.5C  Wrist 3.6 3.3 0.3 17.8 41.7 57.3 Wrist Palm     Palm 1.7 1.5 0.2 19.2 11.4 40.6       Radial Anti Sensory (Base 1st Digit)  32.6C  Wrist 2.3 2.3 0.0 40.3 54.7 26.3 Wrist Base 1st Digit     Ulnar Anti Sensory (5th Digit)  32.8C  Wrist 3.1 2.9 0.2 22.4 29.4 23.8 Wrist 5th Digit 45 48 3   Motor Left/Right Comparison   Stim Site L Lat (ms) R Lat (ms) L-R Lat (ms) L Amp (mV) R Amp (mV) L-R Amp (%) Site1 Site2 L Vel (m/s)  R Vel (m/s) L-R Vel (m/s)  Median Motor (Abd Poll Brev)  32.7C  Wrist 3.4 3.3 0.1 7.3 7.3 0.0 Elbow Wrist 54 55 1  Elbow 7.5 7.5 0.0 6.7 3.3 50.7       Ulnar Motor (Abd Dig Min)  32.7C  Wrist 3.0 2.8 0.2 4.2 5.6 25.0 B Elbow Wrist 55 59 4  B Elbow 7.0 6.7 0.3 3.8 5.2 26.9 A Elbow B Elbow 56 67 11  A Elbow 8.8 8.2 0.6 2.6 5.1 49.0          Waveforms:                     Clinical History: EXAMINATION: MRI lumbar spine without contrast 12/12/2016  CLINICAL INDICATION: Neurogenic claudication  TECHNIQUE: MRI lumbar spine protocol without contrast.   COMPARISON: 06/13/2015  FINDINGS:  There is a 22 degree levoscoliosis due to lumbar spondylosis  Bone marrow signal: Normal  Conus medullaris and cauda equina: Normal  L1-L2: Normal  L2-L3: Normal  L3-L4: There is severe degenerative disc disease with a broad-based disc bulge. There is severe bilateral facet arthritis with facet and ligamentous enlargement. There is severe spinal stenosis. There is moderate stenosis of the right neural foramen and  mild stenosis of the left neural foramen.  L4-L5: There is severe degenerative disc disease with a broad-based disc bulge. There is bilateral facet arthritis with facet and ligamentous enlargement. There is severe spinal stenosis and severe stenosis of the right neural foramen  L5-S1: There is moderate degeneration of the left side of the disc with a lateral bulge of the disc to the left. There  is facet arthritis on the left. There is severe stenosis of the left lateral recess and left neural foramen  IMPRESSION:  1. 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 Severe spinal stenosis and severe right neuroforaminal stenosis at L3-L4 and L4-L5 due to severe degenerative disc disease, facet arthritis and scoliosis 3. Severe left lateral recess and left neuroforaminal stenosis at L5-S1 due to degeneration of the left side of the disc and facet  arthritis on the left  There is no significant change compared with 06/13/2015  She reports that she has never smoked. She has never used smokeless tobacco. No results for input(s): HGBA1C, LABURIC in the last 8760 hours.  Objective:  VS:  HT:    WT:   BMI:     BP:   HR: bpm  TEMP: ( )  RESP:  Physical Exam  Musculoskeletal:  Inspection reveals no atrophy of the bilateral APB or FDI or hand intrinsics. There is no swelling, color changes, allodynia or dystrophic changes. There is 5 out of 5 strength in the bilateral wrist extension, finger abduction and long finger flexion. There is intact sensation to light touch in all dermatomal and peripheral nerve distributions. There is a negative Tinel's test at the bilateral wrist and elbow. There is a negative Phalen's test bilaterally. There is a negative Hoffmann's test bilaterally.    Ortho Exam Imaging: No results found.  Past Medical/Family/Surgical/Social History: Medications & Allergies reviewed per EMR Patient Active Problem List   Diagnosis Date Noted  . 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 Dizziness and giddiness 09/27/2015  . Tension headache 03/23/2013  . Disturbance of skin sensation 02/02/2013  . H/O cervical spine surgery 02/02/2013  . Degeneration of lumbar or lumbosacral intervertebral disc 02/02/2013  . Generalized anxiety disorder 02/02/2013  . Hypercholesteremia    Past Medical History:  Diagnosis Date  . Arthritis   . Chronic pain   . Headache   . Hypercholesteremia   . Hypertension   . Spinal stenosis   . Thyroid disease    Family History  Problem Relation Age of Onset  . Dementia Mother   . Lung cancer Father   . Migraines Daughter   . Breast cancer Neg Hx    Past Surgical History:  Procedure Laterality Date  . NECK SURGERY     [id number]   Social History   Occupational History  . Rtired  Retired   Social History Main Topics  . Smoking status: Never Smoker  . Smokeless tobacco: Never Used  . Alcohol use No    . Drug use: No  . Sexual activity: Not on file

## 2016-12-23 NOTE — Progress Notes (Signed)
Lauren Garrett - 71 y.o. female MRN [id number]  Date of birth: 1946/01/13  Office Visit Note: Visit Date: 12/10/2016 PCP: Haywood Pao, MD Referred by: Haywood Pao, MD  Subjective: Chief Complaint  Patient presents with  . Right Leg - Numbness, Pain  . Left Leg - Numbness, Pain   HPI: Lauren Garrett is a 71 year old female who comes in today at the request of Dr. Christella Noa and Dr. Durward Fortes. The main requesting physician is Dr. Christella Noa. Her complaints are numerous but basically she is having chronic worsening numbness in all of the toes as well as bilateral ankle pain and bilateral knee pain. She does get tingling in the legs and even numbness to the ball of the foot on the left. She reports ankle swelling. She reports the left knee turning a funny direction when she tries to move. She reports difficulty ambulating and multiple somatic and musculoskeletal complaints. She has had an MRI in February of this year showing continued scoliosis and severe multifactorial stenosis at L3-4 and L4-5. She reports a new MRI is on order in the next few days and that will be completed. She reports that Dr. Christella Noa is told her he couldn't do surgery just not sure of this as well as causing her symptoms. I have seen her on a few occasions through both Dr. Durward Fortes and Dr. Christella Noa. I did see her for electrodiagnostic study in 2011. This is reviewed below but basically showed L4, L5 and S1 radiculopathies bilaterally which were chronic and severe and consistent with stenosis. She did not have much in the way of a peripheral polyneuropathy at that point. She has no diagnosis of diabetes. No chemotherapy. She evidently has had a repeat electrodiagnostic study performed by Dr. Brien Few sometime in the last few years. She also sees Dr. Leonie Man for headaches. He evidently reviewed both electrodiagnostic studies. I have not seen Dr. Brien Few is electrodiagnostic study. She also reports tingling in the fingertips  bilaterally. We had artery had her scheduled for this lower extremity study so we will have her come back next week for upper extremity testing. She has had known stenosis of the lumbar spine as far back as 2004. She's also had a prior ACDF performed.    ROS Otherwise per HPI.  Assessment & Plan: Visit Diagnoses:  1. Paresthesia of skin   2. Weakness   3. Spinal stenosis of lumbar region with neurogenic claudication     Plan: No additional findings.  Impression: The above electrodiagnostic study is very difficult to interpret but is ABNORMAL and reveals evidence most consistent with severe chronic  L5, and S1 radiculopathy on the right and left.  However, with the nerve conduction portion of the study I cannot rule out a concomitant underlying peripheral polyneuropathy which would be severe. The interpretation is problematic because of bilateral intrinsic foot muscle atrophy. This represents a significant change from the nerve conduction portion of the study performed by myself in 2011. There has been an interim electrodiagnostic study performed by Dr. Brien Few. Unfortunately, I do not have that study to review. The patient's neurologist Dr. Leonie Man has evidently seen both studies. Also the patient has an upcoming repeat lumbar spine MRI which may yield some help diagnostically. She also has electrodiagnostic study of the upper extremities next week. Prior lumbar spine MRI did show severe stenosis.  Recommendations: 1.  Follow-up with referring physician, Dr. Cyndy Freeze. See if new lumbar spine MRI since any light on to the diagnosis. Perhaps have her neurologist  review her case and electrodiagnostic studies for potential polyneuropathy. 2.  Continue current management of symptoms.    Meds & Orders: No orders of the defined types were placed in this encounter.   Orders Placed This Encounter  Procedures  . NCV with EMG (electromyography)    Follow-up: Return for Upper extremity electrodiagnostic  study next week.   Procedures: No procedures performed  EMG & NCV Findings: Evaluation of the left fibular motor nerve showed prolonged distal onset latency (7.8 ms), reduced amplitude (0.3 mV), decreased conduction velocity (B Fib-Ankle, 34 m/s), and decreased conduction velocity (Poplt-B Fib, 32 m/s).  The right fibular motor nerve showed reduced amplitude (0.8 mV).  The left tibial motor nerve showed no response (Ankle).  The right tibial motor nerve showed prolonged distal onset latency (8.7 ms) and reduced amplitude (1.7 mV).  The left saphenous sensory and the right saphenous sensory nerves showed no response (14cm).  The left superficial fibular sensory and the right superficial fibular sensory nerves showed no response (14 cm).  The left sural sensory and the right sural sensory nerves showed prolonged distal peak latency (L4.3, R7.2 ms) and decreased conduction velocity (Calf-Lat Mall, L33, R19 m/s).  Left vs. Right side comparison data for the fibular motor nerve indicates abnormal L-R latency difference (2.8 ms), abnormal L-R amplitude difference (62.5 %), and abnormal L-R velocity difference (Poplt-B Fib, 28 m/s).    Needle evaluation of the right Fibularis Longus muscle showed increased insertional activity and slightly increased spontaneous activity.  The right medial gastrocnemius muscle showed increased insertional activity and diminished recruitment.  The left Fibularis Longus muscle showed increased insertional activity, slightly increased spontaneous activity, and diminished recruitment.  The left medial gastrocnemius muscle showed increased insertional activity, increased motor unit amplitude, and diminished recruitment.  All remaining muscles (as indicated in the following table) showed no evidence of electrical instability.    Impression: The above electrodiagnostic study is very difficult to interpret but is ABNORMAL and reveals evidence most consistent with severe chronic  L5, and  S1 radiculopathy on the right and left.  However, with the nerve conduction portion of the study I cannot rule out a concomitant underlying peripheral polyneuropathy which would be severe. The interpretation is problematic because of bilateral intrinsic foot muscle atrophy. This represents a significant change from the nerve conduction portion of the study performed by myself in 2011. There has been an interim electrodiagnostic study performed by Dr. Brien Few. Unfortunately, I do not have that study to review. The patient's neurologist Dr. Leonie Man has evidently seen both studies. Also the patient has an upcoming repeat lumbar spine MRI which may yield some help diagnostically. She also has electrodiagnostic study of the upper extremities next week. Prior lumbar spine MRI did show severe stenosis.  Recommendations: 1.  Follow-up with referring physician, Dr. Cyndy Freeze. See if new lumbar spine MRI since any light on to the diagnosis. Perhaps have her neurologist review her case and electrodiagnostic studies for potential polyneuropathy. 2.  Continue current management of symptoms.     Nerve Conduction Studies Anti Sensory Summary Table   Stim Site NR Peak (ms) Norm Peak (ms) P-T Amp (V) Norm P-T Amp Site1 Site2 Delta-P (ms) Dist (cm) Vel (m/s) Norm Vel (m/s)  Left Saphenous Anti Sensory (Ant Med Mall)  28.8C  14cm *NR  <4.4  >2 14cm Ant Med Mall  0.0  >32  Right Saphenous Anti Sensory (Ant Med Mall)  28.2C  14cm *NR  <4.4  >2 14cm Ant Med Mall  0.0  >32  Left Sup Fibular Anti Sensory (Ant Lat Mall)  28.8C  14 cm *NR  <4.4  >5.0 14 cm Ant Lat Mall  14.0  >32  Right Sup Fibular Anti Sensory (Ant Lat Mall)  28.2C  14 cm *NR  <4.4  >5.0 14 cm Ant Lat Mall  14.0  >32  Left Sural Anti Sensory (Lat Mall)  28.5C  Calf    *4.3 <4.0 10.2 >5.0 Calf Lat Mall 4.3 14.0 *33 >35  Right Sural Anti Sensory (Lat Mall)  28.2C  Calf    *7.2 <4.0 6.5 >5.0 Calf Lat Mall 7.2 14.0 *19 >35   Motor Summary Table    Stim Site NR Onset (ms) Norm Onset (ms) O-P Amp (mV) Norm O-P Amp Site1 Site2 Delta-0 (ms) Dist (cm) Vel (m/s) Norm Vel (m/s)  Left Fibular Motor (Ext Dig Brev)  28.7C  Ankle    *7.8 <6.1 *0.3 >2.5 B Fib Ankle 2.9 10.0 *34 >38  B Fib    10.7  0.0  Poplt B Fib 3.1 10.0 *32 >40  Poplt    13.8  0.0         Right Fibular Motor (Ext Dig Brev)  28.1C  Ankle    5.0 <6.1 *0.8 >2.5 B Fib Ankle 8.9 36.0 40 >38  B Fib    13.9  0.5  Poplt B Fib 1.5 9.0 60 >40  Poplt    15.4  1.0         Left Tibial Motor (Abd Hall Brev)  28.7C  Ankle *NR  <6.1  >3.0        Right Tibial Motor (Abd Hall Brev)  28.2C  Ankle    *8.7 <6.1 *1.7 >3.0 Knee Ankle 8.3 38.0 46 >35  Knee    17.0  0.1          EMG   Side Muscle Nerve Root Ins Act Fibs Psw Amp Dur Poly Recrt Int Fraser Din Comment  Right AntTibialis Dp Br Peron L4-5 Nml Nml Nml Nml Nml 0 Nml Nml   Right Fibularis Longus  Sup Br Peron L5-S1 *Incr *1+ *1+ Nml Nml 0 Nml Nml   Right MedGastroc Tibial S1-2 *Incr Nml Nml Nml Nml 0 *Reduced Nml   Right VastusMed Femoral L2-4 Nml Nml Nml Nml Nml 0 Nml Nml   Right BicepsFemS Sciatic L5-S1 Nml Nml Nml Nml Nml 0 Nml Nml   Left AntTibialis Dp Br Peron L4-5 Nml Nml Nml Nml Nml 0 Nml Nml   Left Fibularis Longus  Sup Br Peron L5-S1 *Incr *1+ *1+ Nml Nml 0 *Reduced Nml   Left MedGastroc Tibial S1-2 *CRD Nml Nml *Incr Nml 0 *Reduced Nml   Left VastusMed Femoral L2-4 Nml Nml Nml Nml Nml 0 Nml Nml   Left BicepsFemS Sciatic L5-S1 Nml Nml Nml Nml Nml 0 Nml Nml     Nerve Conduction Studies Anti Sensory Left/Right Comparison   Stim Site L Lat (ms) R Lat (ms) L-R Lat (ms) L Amp (V) R Amp (V) L-R Amp (%) Site1 Site2 L Vel (m/s) R Vel (m/s) L-R Vel (m/s)  Saphenous Anti Sensory (Ant Med Mall)  28.8C  14cm       14cm Ant Med Mall     Sup Fibular Anti Sensory (Ant Lat Mall)  28.8C  14 cm       14 cm Ant Lat Mall     Sural Anti Sensory (Lat Mall)  28.5C  Calf *4.3 *7.2 2.9 10.2 6.5 36.3 Calf  Lat Mall *33 *19 14   Motor  Left/Right Comparison   Stim Site L Lat (ms) R Lat (ms) L-R Lat (ms) L Amp (mV) R Amp (mV) L-R Amp (%) Site1 Site2 L Vel (m/s) R Vel (m/s) L-R Vel (m/s)  Fibular Motor (Ext Dig Brev)  28.7C  Ankle *7.8 5.0 *2.8 *0.3 *0.8 *62.5 B Fib Ankle *34 40 6  B Fib 10.7 13.9 3.2 0.0 0.5 100.0 Poplt B Fib *32 60 *28  Poplt 13.8 15.4 1.6 0.0 1.0 100.0       Tibial Motor (Abd Hall Brev)  28.7C  Ankle  *8.7   *1.7           Waveforms:                     Clinical History:  Lumbar spine MRI dated 06/13/2015 shows severe multifactorial stenosis at L3-4 and L4-5. She does have scoliosis as well.  Electrodiagnostic study dated 02/21/2010 showed bilateral radiculopathies at L4, L5 and S1 which were chronic and severe and most consistent with stenosis. There was no evidence of peripheral polyneuropathy at that point.  She reports that she has never smoked. She has never used smokeless tobacco.   Objective:  VS:  HT:    WT:   BMI:     BP:   HR: bpm  TEMP: ( )  RESP:  Physical Exam  Musculoskeletal:  Examination of the patient shows increased valgus deformity at the knees bilaterally with also what we referred to his [place] class appearance of her legs with decreased muscle bulk and atrophy in the calves bilaterally as well as intrinsic foot musculature. No significant edema is noted today although there is a mild bit. She has some decreased sensation to light touch in a stocking distribution. Subjective decreased sensation is nondermatomal. She has no clonus. She can dorsiflex and plantarflex but there is some weakness bilaterally may be left more than right.  Neurological: She exhibits normal muscle tone. Coordination normal.    Ortho Exam Imaging: No results found.  Past Medical/Family/Surgical/Social History: Medications & Allergies reviewed per EMR Patient Active Problem List   Diagnosis Date Noted  . 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 Dizziness and giddiness 09/27/2015  . Tension headache  03/23/2013  . Disturbance of skin sensation 02/02/2013  . H/O cervical spine surgery 02/02/2013  . Degeneration of lumbar or lumbosacral intervertebral disc 02/02/2013  . Generalized anxiety disorder 02/02/2013  . Hypercholesteremia    Past Medical History:  Diagnosis Date  . Arthritis   . Chronic pain   . Headache   . Hypercholesteremia   . Hypertension   . Spinal stenosis   . Thyroid disease    Family History  Problem Relation Age of Onset  . Dementia Mother   . Lung cancer Father   . Migraines Daughter   . Breast cancer Neg Hx    Past Surgical History:  Procedure Laterality Date  . NECK SURGERY     [id number]   Social History   Occupational History  . Rtired  Retired   Social History Main Topics  . Smoking status: Never Smoker  . Smokeless tobacco: Never Used  . Alcohol use No  . Drug use: No  . Sexual activity: Not on file

## 2016-12-23 NOTE — Procedures (Signed)
EMG & NCV Findings: Evaluation of the left fibular motor nerve showed prolonged distal onset latency (7.8 ms), reduced amplitude (0.3 mV), decreased conduction velocity (B Fib-Ankle, 34 m/s), and decreased conduction velocity (Poplt-B Fib, 32 m/s).  The right fibular motor nerve showed reduced amplitude (0.8 mV).  The left tibial motor nerve showed no response (Ankle).  The right tibial motor nerve showed prolonged distal onset latency (8.7 ms) and reduced amplitude (1.7 mV).  The left saphenous sensory and the right saphenous sensory nerves showed no response (14cm).  The left superficial fibular sensory and the right superficial fibular sensory nerves showed no response (14 cm).  The left sural sensory and the right sural sensory nerves showed prolonged distal peak latency (L4.3, R7.2 ms) and decreased conduction velocity (Calf-Lat Mall, L33, R19 m/s).  Left vs. Right side comparison data for the fibular motor nerve indicates abnormal L-R latency difference (2.8 ms), abnormal L-R amplitude difference (62.5 %), and abnormal L-R velocity difference (Poplt-B Fib, 28 m/s).    Needle evaluation of the right Fibularis Longus muscle showed increased insertional activity and slightly increased spontaneous activity.  The right medial gastrocnemius muscle showed increased insertional activity and diminished recruitment.  The left Fibularis Longus muscle showed increased insertional activity, slightly increased spontaneous activity, and diminished recruitment.  The left medial gastrocnemius muscle showed increased insertional activity, increased motor unit amplitude, and diminished recruitment.  All remaining muscles (as indicated in the following table) showed no evidence of electrical instability.    Impression: The above electrodiagnostic study is very difficult to interpret but is ABNORMAL and reveals evidence most consistent with severe chronic  L5, and S1 radiculopathy on the right and left.  However, with the  nerve conduction portion of the study I cannot rule out a concomitant underlying peripheral polyneuropathy which would be severe. The interpretation is problematic because of bilateral intrinsic foot muscle atrophy. This represents a significant change from the nerve conduction portion of the study performed by myself in 2011. There has been an interim electrodiagnostic study performed by Dr. Brien Few. Unfortunately, I do not have that study to review. The patient's neurologist Dr. Leonie Man has evidently seen both studies. Also the patient has an upcoming repeat lumbar spine MRI which may yield some help diagnostically. She also has electrodiagnostic study of the upper extremities next week. Prior lumbar spine MRI did show severe stenosis.  Recommendations: 1.  Follow-up with referring physician, Dr. Cyndy Freeze. See if new lumbar spine MRI since any light on to the diagnosis. Perhaps have her neurologist review her case and electrodiagnostic studies for potential polyneuropathy. 2.  Continue current management of symptoms.     Nerve Conduction Studies Anti Sensory Summary Table   Stim Site NR Peak (ms) Norm Peak (ms) P-T Amp (V) Norm P-T Amp Site1 Site2 Delta-P (ms) Dist (cm) Vel (m/s) Norm Vel (m/s)  Left Saphenous Anti Sensory (Ant Med Mall)  28.8C  14cm *NR  <4.4  >2 14cm Ant Med Mall  0.0  >32  Right Saphenous Anti Sensory (Ant Med Mall)  28.2C  14cm *NR  <4.4  >2 14cm Ant Med Mall  0.0  >32  Left Sup Fibular Anti Sensory (Ant Lat Mall)  28.8C  14 cm *NR  <4.4  >5.0 14 cm Ant Lat Mall  14.0  >32  Right Sup Fibular Anti Sensory (Ant Lat Mall)  28.2C  14 cm *NR  <4.4  >5.0 14 cm Ant Lat Mall  14.0  >32  Left Sural Anti Sensory (Lat Mall)  28.5C  Calf    *4.3 <4.0 10.2 >5.0 Calf Lat Mall 4.3 14.0 *33 >35  Right Sural Anti Sensory (Lat Mall)  28.2C  Calf    *7.2 <4.0 6.5 >5.0 Calf Lat Mall 7.2 14.0 *19 >35   Motor Summary Table   Stim Site NR Onset (ms) Norm Onset (ms) O-P Amp (mV) Norm O-P  Amp Site1 Site2 Delta-0 (ms) Dist (cm) Vel (m/s) Norm Vel (m/s)  Left Fibular Motor (Ext Dig Brev)  28.7C  Ankle    *7.8 <6.1 *0.3 >2.5 B Fib Ankle 2.9 10.0 *34 >38  B Fib    10.7  0.0  Poplt B Fib 3.1 10.0 *32 >40  Poplt    13.8  0.0         Right Fibular Motor (Ext Dig Brev)  28.1C  Ankle    5.0 <6.1 *0.8 >2.5 B Fib Ankle 8.9 36.0 40 >38  B Fib    13.9  0.5  Poplt B Fib 1.5 9.0 60 >40  Poplt    15.4  1.0         Left Tibial Motor (Abd Hall Brev)  28.7C  Ankle *NR  <6.1  >3.0        Right Tibial Motor (Abd Hall Brev)  28.2C  Ankle    *8.7 <6.1 *1.7 >3.0 Knee Ankle 8.3 38.0 46 >35  Knee    17.0  0.1          EMG   Side Muscle Nerve Root Ins Act Fibs Psw Amp Dur Poly Recrt Int Fraser Din Comment  Right AntTibialis Dp Br Peron L4-5 Nml Nml Nml Nml Nml 0 Nml Nml   Right Fibularis Longus  Sup Br Peron L5-S1 *Incr *1+ *1+ Nml Nml 0 Nml Nml   Right MedGastroc Tibial S1-2 *Incr Nml Nml Nml Nml 0 *Reduced Nml   Right VastusMed Femoral L2-4 Nml Nml Nml Nml Nml 0 Nml Nml   Right BicepsFemS Sciatic L5-S1 Nml Nml Nml Nml Nml 0 Nml Nml   Left AntTibialis Dp Br Peron L4-5 Nml Nml Nml Nml Nml 0 Nml Nml   Left Fibularis Longus  Sup Br Peron L5-S1 *Incr *1+ *1+ Nml Nml 0 *Reduced Nml   Left MedGastroc Tibial S1-2 *CRD Nml Nml *Incr Nml 0 *Reduced Nml   Left VastusMed Femoral L2-4 Nml Nml Nml Nml Nml 0 Nml Nml   Left BicepsFemS Sciatic L5-S1 Nml Nml Nml Nml Nml 0 Nml Nml     Nerve Conduction Studies Anti Sensory Left/Right Comparison   Stim Site L Lat (ms) R Lat (ms) L-R Lat (ms) L Amp (V) R Amp (V) L-R Amp (%) Site1 Site2 L Vel (m/s) R Vel (m/s) L-R Vel (m/s)  Saphenous Anti Sensory (Ant Med Mall)  28.8C  14cm       14cm Ant Med Mall     Sup Fibular Anti Sensory (Ant Lat Mall)  28.8C  14 cm       14 cm Ant Lat Mall     Sural Anti Sensory (Lat Mall)  28.5C  Calf *4.3 *7.2 2.9 10.2 6.5 36.3 Calf Lat Mall *33 *19 14   Motor Left/Right Comparison   Stim Site L Lat (ms) R Lat (ms) L-R Lat (ms)  L Amp (mV) R Amp (mV) L-R Amp (%) Site1 Site2 L Vel (m/s) R Vel (m/s) L-R Vel (m/s)  Fibular Motor (Ext Dig Brev)  28.7C  Ankle *7.8 5.0 *2.8 *0.3 *0.8 *62.5 B Fib Ankle *34 40 6  B Fib 10.7 13.9 3.2 0.0 0.5 100.0 Poplt B Fib *32 60 *28  Poplt 13.8 15.4 1.6 0.0 1.0 100.0       Tibial Motor (Abd Hall Brev)  28.7C  Ankle  *8.7   *1.7           Waveforms:

## 2016-12-23 NOTE — Procedures (Signed)
EMG & NCV Findings: All nerve conduction studies (as indicated in the following tables) were within normal limits.  All left vs. right side differences were within normal limits.    All examined muscles (as indicated in the following table) showed no evidence of electrical instability.    Impression: Essentially NORMAL electrodiagnostic study of both upper limbs.    There is no significant electrodiagnostic evidence of nerve entrapment, brachial plexopathy, cervical radiculopathy or generalized peripheral neuropathy.    As you know, purely sensory or demyelinating radiculopathies and chemical radiculitis may not be detected with this particular electrodiagnostic study.  This electrodiagnostic study cannot rule out small fiber polyneuropathy and dysesthesias from central pain sensitization syndromes such as fibromyalgia.  Recommendations: 1.  Follow-up with referring physician. 2.  Continue current management of symptoms.    Nerve Conduction Studies Anti Sensory Summary Table   Stim Site NR Peak (ms) Norm Peak (ms) P-T Amp (V) Norm P-T Amp Site1 Site2 Delta-P (ms) Dist (cm) Vel (m/s) Norm Vel (m/s)  Left Median Acr Palm Anti Sensory (2nd Digit)  32.5C  Wrist    3.6 <3.6 17.8 >10 Wrist Palm 1.9 0.0    Palm    1.7 <2.0 19.2         Right Median Acr Palm Anti Sensory (2nd Digit)  34C  Wrist    3.3 <3.6 41.7 >10 Wrist Palm 1.8 0.0    Palm    1.5 <2.0 11.4         Left Radial Anti Sensory (Base 1st Digit)  32.6C  Wrist    2.3 <3.1 40.3  Wrist Base 1st Digit 2.3 0.0    Right Radial Anti Sensory (Base 1st Digit)  33.3C  Wrist    2.3 <3.1 54.7  Wrist Base 1st Digit 2.3 0.0    Left Ulnar Anti Sensory (5th Digit)  32.8C  Wrist    3.1 <3.7 22.4 >15.0 Wrist 5th Digit 3.1 14.0 45 >38  Right Ulnar Anti Sensory (5th Digit)  34.6C  Wrist    2.9 <3.7 29.4 >15.0 Wrist 5th Digit 2.9 14.0 48 >38   Motor Summary Table   Stim Site NR Onset (ms) Norm Onset (ms) O-P Amp (mV) Norm O-P Amp Site1  Site2 Delta-0 (ms) Dist (cm) Vel (m/s) Norm Vel (m/s)  Left Median Motor (Abd Poll Brev)  32.7C  Wrist    3.4 <4.2 7.3 >5 Elbow Wrist 4.1 22.0 54 >50  Elbow    7.5  6.7         Right Median Motor (Abd Poll Brev)  32.6C  Wrist    3.3 <4.2 7.3 >5 Elbow Wrist 4.2 23.0 55 >50  Elbow    7.5  3.3         Left Ulnar Motor (Abd Dig Min)  32.7C  Wrist    3.0 <4.2 4.2 >3 B Elbow Wrist 4.0 22.0 55 >53  B Elbow    7.0  3.8  A Elbow B Elbow 1.8 10.0 56 >53  A Elbow    8.8  2.6         Right Ulnar Motor (Abd Dig Min)  32.5C  Wrist    2.8 <4.2 5.6 >3 B Elbow Wrist 3.9 23.0 59 >53  B Elbow    6.7  5.2  A Elbow B Elbow 1.5 10.0 67 >53  A Elbow    8.2  5.1          EMG   Side Muscle Nerve Root Ins Act  Fibs Psw Amp Dur Poly Recrt Int Fraser Din Comment  Right Abd Poll Brev Median C8-T1 Nml Nml Nml Nml Nml 0 Nml Nml   Right 1stDorInt Ulnar C8-T1 Nml Nml Nml Nml Nml 0 Nml Nml   Right PronatorTeres Median C6-7 Nml Nml Nml Nml Nml 0 Nml Nml   Right Biceps Musculocut C5-6 Nml Nml Nml Nml Nml 0 Nml Nml   Right Deltoid Axillary C5-6 Nml Nml Nml Nml Nml 0 Nml Nml     Nerve Conduction Studies Anti Sensory Left/Right Comparison   Stim Site L Lat (ms) R Lat (ms) L-R Lat (ms) L Amp (V) R Amp (V) L-R Amp (%) Site1 Site2 L Vel (m/s) R Vel (m/s) L-R Vel (m/s)  Median Acr Palm Anti Sensory (2nd Digit)  32.5C  Wrist 3.6 3.3 0.3 17.8 41.7 57.3 Wrist Palm     Palm 1.7 1.5 0.2 19.2 11.4 40.6       Radial Anti Sensory (Base 1st Digit)  32.6C  Wrist 2.3 2.3 0.0 40.3 54.7 26.3 Wrist Base 1st Digit     Ulnar Anti Sensory (5th Digit)  32.8C  Wrist 3.1 2.9 0.2 22.4 29.4 23.8 Wrist 5th Digit 45 48 3   Motor Left/Right Comparison   Stim Site L Lat (ms) R Lat (ms) L-R Lat (ms) L Amp (mV) R Amp (mV) L-R Amp (%) Site1 Site2 L Vel (m/s) R Vel (m/s) L-R Vel (m/s)  Median Motor (Abd Poll Brev)  32.7C  Wrist 3.4 3.3 0.1 7.3 7.3 0.0 Elbow Wrist 54 55 1  Elbow 7.5 7.5 0.0 6.7 3.3 50.7       Ulnar Motor (Abd Dig Min)  32.7C    Wrist 3.0 2.8 0.2 4.2 5.6 25.0 B Elbow Wrist 55 59 4  B Elbow 7.0 6.7 0.3 3.8 5.2 26.9 A Elbow B Elbow 56 67 11  A Elbow 8.8 8.2 0.6 2.6 5.1 49.0          Waveforms:

## 2017-01-12 ENCOUNTER — Encounter (INDEPENDENT_AMBULATORY_CARE_PROVIDER_SITE_OTHER): Payer: Self-pay | Admitting: Orthopaedic Surgery

## 2017-01-12 ENCOUNTER — Ambulatory Visit (INDEPENDENT_AMBULATORY_CARE_PROVIDER_SITE_OTHER): Payer: Worker's Compensation | Admitting: Orthopaedic Surgery

## 2017-01-12 VITALS — BP 141/71 | HR 67 | Resp 12 | Ht 69.0 in | Wt 165.0 lb

## 2017-01-12 DIAGNOSIS — G8929 Other chronic pain: Secondary | ICD-10-CM | POA: Diagnosis not present

## 2017-01-12 DIAGNOSIS — M5441 Lumbago with sciatica, right side: Secondary | ICD-10-CM

## 2017-01-12 DIAGNOSIS — M5442 Lumbago with sciatica, left side: Secondary | ICD-10-CM

## 2017-01-12 NOTE — Telephone Encounter (Signed)
Reports sent to Dr. Saintclair Halsted.

## 2017-01-12 NOTE — Progress Notes (Signed)
Office Visit Note   Patient: NILZA EAKER           Date of Birth: Jan 07, 1946           MRN: [id number] Visit Date: 01/12/2017              Requested by: Haywood Pao, MD [address] [place], [place] [address] PCP: Haywood Pao, MD   Assessment & Plan: Visit Diagnoses:  1. Chronic bilateral low back pain with bilateral sciatica   ecent nerve conduction studies and EMGs to both upper and lower extremities performed by DR nEWTON.lower extremity studies demonstrated a severe and chronic L5 and S1 radiculopathy on the right and left sides.There was a possibility of a polyneuropathy.Upper extremity study was normal  Plan: long discussion over nearly 1 hour regarding all of the above. Long review of her present problems to date. Has a second opinion with Dr. Lynann Bologna next week. Has a follow-up with Dr. Leonie Man at the end of October. She will see Dr. Cyndy Freeze within the next month.I have no specific recommendations for treatment at this point.  Follow-Up Instructions: Return if symptoms worsen or fail to improve.   Orders:  No orders of the defined types were placed in this encounter.  No orders of the defined types were placed in this encounter.     Procedures: No procedures performed   Clinical Data: No additional findings.   Subjective: Chief Complaint  Patient presents with  . Left Hip - Pain    Mrs. Bibbins is a 71 y o that haslocalizing some pain at the area of the left buttock and thigh exam has some discomfort over the iliac bursa  EMG and nerve conduction studies were recently performed with the results as above. Her problems are consistent with her previous complaints of pain in her back and bilateral lower extremities. She's had multiple MRI scans of her pelvis or back or ankle in the past. There is evidence of arthritic changes in both of her knees. There is some chronic strain of the late left ankle ligaments. She's had prior MRI scan of her lumbar  spine demonstrating severe degenerative changes with stenosis. There is also some mild degenerative changes of both hips which I do not believe are symptomatic. Mrs. Ehrhard's biggest concern is the pain she is experiencing in the area of her left hip after she had a twisting injury to her left lower extremity in March 2017. MRI scan performed in October 2017 did not demonstrate any specific pathology other than some mild degenerative changes in her hip. She was having some pain directly over the ischial tuberosity. Dr. Ernestina Patches injected that approximately month ago with some temporary relief of her pain. She continues to use a cane for ambulatory aid.  HPI  Review of Systems  Constitutional: Negative for chills, fatigue and fever.  Eyes: Negative for itching.  Respiratory: Negative for chest tightness and shortness of breath.   Cardiovascular: Negative for chest pain, palpitations and leg swelling.  Gastrointestinal: Negative for blood in stool, constipation and diarrhea.  Endocrine: Negative for polyuria.  Genitourinary: Negative for dysuria.  Musculoskeletal: Positive for back pain. Negative for joint swelling, neck pain and neck stiffness.  Allergic/Immunologic: Negative for immunocompromised state.  Neurological: Negative for dizziness and numbness.  Hematological: Does not bruise/bleed easily.  Psychiatric/Behavioral: The patient is not nervous/anxious.      Objective: Vital Signs: BP (!) 141/71   Pulse 67   Resp 12   Ht 5\' 9"  (  1.753 m)   Wt 165 lb (74.8 kg)   BMI 24.37 kg/m   Physical Exam  Ortho Examstraight leg raise is negative bilaterally. No pain with range of motion of either hip with internal and external rotation. Has lost about 20 pounds over the last several months. No percussible tenderness of either thigh or calf. Some chronic pain along the lateral aspect of her left ankle with prior ankle sprains.no knee effusion spell with some medial lateral joint pain and mild  patella crepitation consistent with her previously diagnosed osteoarthritis. Does have some local tenderness over the left ischial tuberosity. No masses.  Specialty Comments:  No specialty comments available.  Imaging: No results found.   PMFS History: Patient Active Problem List   Diagnosis Date Noted  . 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 Dizziness and giddiness 09/27/2015  . Tension headache 03/23/2013  . Disturbance of skin sensation 02/02/2013  . H/O cervical spine surgery 02/02/2013  . Degeneration of lumbar or lumbosacral intervertebral disc 02/02/2013  . Generalized anxiety disorder 02/02/2013  . Hypercholesteremia    Past Medical History:  Diagnosis Date  . Arthritis   . Chronic pain   . Headache   . Hypercholesteremia   . Hypertension   . Spinal stenosis   . Thyroid disease     Family History  Problem Relation Age of Onset  . Dementia Mother   . Lung cancer Father   . Migraines Daughter   . Breast cancer Neg Hx     Past Surgical History:  Procedure Laterality Date  . NECK SURGERY     [id number]   Social History   Occupational History  . Rtired  Retired   Social History Main Topics  . Smoking status: Never Smoker  . Smokeless tobacco: Never Used  . Alcohol use No  . Drug use: No  . Sexual activity: Not on file

## 2017-01-15 ENCOUNTER — Telehealth (INDEPENDENT_AMBULATORY_CARE_PROVIDER_SITE_OTHER): Payer: Self-pay | Admitting: Physical Medicine and Rehabilitation

## 2017-01-15 ENCOUNTER — Encounter: Payer: Self-pay | Admitting: Podiatry

## 2017-01-15 ENCOUNTER — Ambulatory Visit (INDEPENDENT_AMBULATORY_CARE_PROVIDER_SITE_OTHER): Payer: Medicare Other | Admitting: Podiatry

## 2017-01-15 VITALS — BP 150/70 | HR 65 | Resp 16

## 2017-01-15 DIAGNOSIS — M79675 Pain in left toe(s): Secondary | ICD-10-CM | POA: Diagnosis not present

## 2017-01-15 DIAGNOSIS — M79674 Pain in right toe(s): Secondary | ICD-10-CM | POA: Diagnosis not present

## 2017-01-15 DIAGNOSIS — B351 Tinea unguium: Secondary | ICD-10-CM

## 2017-01-15 LAB — HEPATIC FUNCTION PANEL
AG RATIO: 1.6 (calc) (ref 1.0–2.5)
ALT: 25 U/L (ref 6–29)
AST: 20 U/L (ref 10–35)
Albumin: 3.9 g/dL (ref 3.6–5.1)
Alkaline phosphatase (APISO): 78 U/L (ref 33–130)
BILIRUBIN DIRECT: 0.1 mg/dL (ref 0.0–0.2)
BILIRUBIN INDIRECT: 0.3 mg/dL (ref 0.2–1.2)
BILIRUBIN TOTAL: 0.4 mg/dL (ref 0.2–1.2)
GLOBULIN: 2.4 g/dL (ref 1.9–3.7)
Total Protein: 6.3 g/dL (ref 6.1–8.1)

## 2017-01-15 MED ORDER — TERBINAFINE HCL 250 MG PO TABS: 250.0000 mg | ORAL_TABLET | Freq: Every day | ORAL | 0 refills | Status: DC

## 2017-01-15 NOTE — Telephone Encounter (Signed)
Records 11/19/2016- present ready at front desk for patient to pick up

## 2017-01-15 NOTE — Progress Notes (Signed)
Subjective:    Patient ID: Lauren Garrett, female   DOB: 71 y.o.   MRN: [id number]   HPI patient presents stating she's had a lot of problems with fungus in her nails now for nails and pain when she wears shoes. 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       Objective:  Physical Exam  Constitutional: She appears well-developed and well-nourished.  Cardiovascular: Intact distal pulses.   Pulmonary/Chest: Effort normal.  Musculoskeletal: Normal range of motion.  Neurological: She is alert.  Skin: Skin is warm.  Nursing note and vitals reviewed.  neurovascular status intact muscle strength adequate range of motion within normal limits with patient found to have thick nailbeds 1-5 both feet that are dystrophic with damage to the hallux nails bilateral with abnormal growth pattern in a lateral direction and moderate pain when palpated. 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 Patient will be seen back as needed and will have liver function studies done today and we'll start oral medicine understanding risk of oral medicine and we will clear with her doctor and pharmacist

## 2017-01-15 NOTE — Progress Notes (Signed)
   Subjective:    Patient ID: Lauren Garrett, female    DOB: Feb 11, 1946, 71 y.o.   MRN: [id number]  HPI Chief Complaint  Patient presents with  . Nail Problem    Bilateral; great toes; nail discoloration & thickened nails; pt stated, "Wants all nails checked for nail fungus"      Review of Systems  Musculoskeletal: Positive for arthralgias, back pain, gait problem, neck pain and neck stiffness.  All other systems reviewed and are negative.      Objective:   Physical Exam        Assessment & Plan:

## 2017-02-11 ENCOUNTER — Telehealth: Payer: Self-pay

## 2017-02-11 NOTE — Telephone Encounter (Signed)
Rn call patient per DR. Sethi advice. PT has had tingling in toes,and numbness in toes, with bilateral ankles which has been a chronic issues since last year. Rn stated per the notes in epic she is seeing a [place] neurosurgery, and a orthopedic md. Pt receives shots in her back for the pain for Dr. Ashok Pall Md. RN stated Dr. Leonie Man wanted to know what concerns she has along with Dr. Ernestina Patches. Pt stated she has had two EMG, and Dr.Newton wanted Dr. Leonie Man to do further work up in his office. Pt stated one of her legs is bigger than the other. While on the phone pt began to say she was having memory problems too. Rn stated the visit is for tingling,and numbness from Dr. Ernestina Patches. Pt is involve in a workers comp which [facility] does not accept or treat. Pt is aware our office does not do worker comp.Rn stated Dr.SEthi is going to see but wants to know the expectations of the visit because of her extensive work up. Pt verbalized understanding.

## 2017-02-12 ENCOUNTER — Ambulatory Visit (INDEPENDENT_AMBULATORY_CARE_PROVIDER_SITE_OTHER): Payer: Medicare Other | Admitting: Neurology

## 2017-02-12 ENCOUNTER — Encounter: Payer: Self-pay | Admitting: Neurology

## 2017-02-12 VITALS — BP 195/84 | Ht 69.0 in | Wt 170.2 lb

## 2017-02-12 DIAGNOSIS — R413 Other amnesia: Secondary | ICD-10-CM

## 2017-02-12 NOTE — Patient Instructions (Addendum)
I had a long discussion the patient and her husband regarding her recent complaints about memory loss and cognitive difficulties which I think are likely related to underlying stress and anxiety but. I recommend wwe check lab work for reversible causes of memory impairment, EEG and MRI scan. I encouraged her to participate in regular relaxation activities like swimming, medication, yoga and exercises. We also discussed memory compensation strategies.She'll return for    as needed     Stress and Stress Management Stress is a normal reaction to life events. It is what you feel when life demands more than you are used to or more than you can handle. Some stress can be useful. For example, the stress reaction can help you catch the last bus of the day, study for a test, or meet a deadline at work. But stress that occurs too often or for too long can cause problems. It can affect your emotional health and interfere with relationships and normal daily activities. Too much stress can weaken your immune system and increase your risk for physical illness. If you already have a medical problem, stress can make it worse. What are the causes? All sorts of life events may cause stress. An event that causes stress for one person may not be stressful for another person. Major life events commonly cause stress. These may be positive or negative. Examples include losing your job, moving into a new home, getting married, having a baby, or losing a loved one. Less obvious life events may also cause stress, especially if they occur day after day or in combination. Examples include working long hours, driving in traffic, caring for children, being in debt, or being in a difficult relationship. What are the signs or symptoms? Stress may cause emotional symptoms including, the following:  Anxiety. This is feeling worried, afraid, on edge, overwhelmed, or out of control.  Anger. This is feeling irritated or  impatient.  Depression. This is feeling sad, down, helpless, or guilty.  Difficulty focusing, remembering, or making decisions.  Stress may cause physical symptoms, including the following:  Aches and pains. These may affect your head, neck, back, stomach, or other areas of your body.  Tight muscles or clenched jaw.  Low energy or trouble sleeping.  Stress may cause unhealthy behaviors, including the following:  Eating to feel better (overeating) or skipping meals.  Sleeping too little, too much, or both.  Working too much or putting off tasks (procrastination).  Smoking, drinking alcohol, or using drugs to feel better.  How is this diagnosed? Stress is diagnosed through an assessment by your health care provider. Your health care provider will ask questions about your symptoms and any stressful life events.Your health care provider will also ask about your medical history and may order blood tests or other tests. Certain medical conditions and medicine can cause physical symptoms similar to stress. Mental illness can cause emotional symptoms and unhealthy behaviors similar to stress. Your health care provider may refer you to a mental health professional for further evaluation. How is this treated? Stress management is the recommended treatment for stress.The goals of stress management are reducing stressful life events and coping with stress in healthy ways. Techniques for reducing stressful life events include the following:  Stress identification. Self-monitor for stress and identify what causes stress for you. These skills may help you to avoid some stressful events.  Time management. Set your priorities, keep a calendar of events, and learn to say "no." These tools can help you  avoid making too many commitments.  Techniques for coping with stress include the following:  Rethinking the problem. Try to think realistically about stressful events rather than ignoring them or  overreacting. Try to find the positives in a stressful situation rather than focusing on the negatives.  Exercise. Physical exercise can release both physical and emotional tension. The key is to find a form of exercise you enjoy and do it regularly.  Relaxation techniques. These relax the body and mind. Examples include yoga, meditation, tai chi, biofeedback, deep breathing, progressive muscle relaxation, listening to music, being out in nature, journaling, and other hobbies. Again, the key is to find one or more that you enjoy and can do regularly.  Healthy lifestyle. Eat a balanced diet, get plenty of sleep, and do not smoke. Avoid using alcohol or drugs to relax.  Strong support network. Spend time with family, friends, or other people you enjoy being around.Express your feelings and talk things over with someone you trust.  Counseling or talktherapy with a mental health professional may be helpful if you are having difficulty managing stress on your own. Medicine is typically not recommended for the treatment of stress.Talk to your health care provider if you think you need medicine for symptoms of stress. Follow these instructions at home:  Keep all follow-up visits as directed by your health care provider.  Take all medicines as directed by your health care provider. Contact a health care provider if:  Your symptoms get worse or you start having new symptoms.  You feel overwhelmed by your problems and can no longer manage them on your own. Get help right away if:  You feel like hurting yourself or someone else. This information is not intended to replace advice given to you by your health care provider. Make sure you discuss any questions you have with your health care provider. Document Released: 10/01/2000 Document Revised: 09/13/2015 Document Reviewed: 11/30/2012 Elsevier Interactive Patient Education  2017 [employer]  1. Use "WARM"  strategy.  W= write it down  A= associate it  R= repeat it  M= make a mental note  2.   You can keep a Social worker.  Use a 3-ring notebook with sections for the following: calendar, important names and phone numbers,  medications, doctors' names/phone numbers, lists/reminders, and a section to journal what you did  each day.   3.    Use a calendar to write appointments down.  4.    Write yourself a schedule for the day.  This can be placed on the calendar or in a separate section of the Memory Notebook.  Keeping a  regular schedule can help memory.  5.    Use medication organizer with sections for each day or morning/evening pills.  You may need help loading it  6.    Keep a basket, or pegboard by the door.  Place items that you need to take out with you in the basket or on the pegboard.  You may also want to  include a message board for reminders.  7.    Use sticky notes.  Place sticky notes with reminders in a place where the task is performed.  For example: " turn off the  stove" placed by the stove, "lock the door" placed on the door at eye level, " take your medications" on  the bathroom mirror or by the place where you normally take your medications.  8.    Use alarms/timers.  Use while  cooking to remind yourself to check on food or as a reminder to take your medicine, or as a  reminder to make a call, or as a reminder to perform another task, etc.
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
## 2017-02-13 LAB — DEMENTIA PANEL
HOMOCYSTEINE: 11.6 umol/L (ref 0.0–15.0)
RPR Ser Ql: NONREACTIVE
TSH: 1.83 u[IU]/mL (ref 0.450–4.500)
Vitamin B-12: 722 pg/mL (ref 232–1245)

## 2017-02-16 ENCOUNTER — Telehealth: Payer: Self-pay

## 2017-02-16 NOTE — Telephone Encounter (Signed)
-----   Message from [name] Fila, MD sent at 02/16/2017  1:10 PM EDT ----- I informed the patient that lab work for reversible causes of memory loss was all normal

## 2017-02-16 NOTE — Telephone Encounter (Signed)
Rn call patient about her dementia panel lab work. Rn stated the lab work was normal. PT verbalized understanding. ------

## 2017-02-26 ENCOUNTER — Ambulatory Visit: Payer: Medicare Other | Admitting: Neurology

## 2017-02-26 DIAGNOSIS — R410 Disorientation, unspecified: Secondary | ICD-10-CM | POA: Diagnosis not present

## 2017-02-26 DIAGNOSIS — R413 Other amnesia: Secondary | ICD-10-CM

## 2017-03-02 ENCOUNTER — Ambulatory Visit
Admission: RE | Admit: 2017-03-02 | Discharge: 2017-03-02 | Disposition: A | Discharge status: Home / Self Care | Payer: Medicare Other | Source: Ambulatory Visit | Attending: Neurology | Admitting: Neurology

## 2017-03-02 DIAGNOSIS — R413 Other amnesia: Secondary | ICD-10-CM | POA: Diagnosis not present

## 2017-03-09 ENCOUNTER — Telehealth: Payer: Self-pay

## 2017-03-09 NOTE — Telephone Encounter (Signed)
-----   Message from [name] Fila, MD sent at 03/06/2017  6:38 PM EST ----- Kindly inform the patient that EEG study was normal

## 2017-03-09 NOTE — Telephone Encounter (Signed)
Patient wrote down results of EEG. ------  Notes recorded by Marval Regal, RN on 03/09/2017 at 4:53 PM EST Rn call patient that her EEG was normal.Patient verbalized understanding.   Rn call patient that her MRI scan of brain shows mild changes of age related hardening of arteries and shrinkage of the brain. No new or worrisome findings. Pt verbalized understanding. PT wrote down the results as I was talking to her. ------ ------

## 2017-06-09 DIAGNOSIS — R030 Elevated blood-pressure reading, without diagnosis of hypertension: Secondary | ICD-10-CM | POA: Insufficient documentation

## 2017-06-09 NOTE — Progress Notes (Signed)
Cardiology Office Note    Date:  06/10/2017   ID:  Lauren, Garrett 08-29-1945, MRN [id number]  PCP:  Lauren Pao, MD  Cardiologist: Lauren Grooms, MD   Chief Complaint  Patient presents with  . Advice Only    Hypertension    History of Present Illness:  Lauren Garrett is a 72 y.o. female referred by Dr. Osborne Garrett and Dr. Christella Garrett due to concerns about her blood pressure.  Patient has requested cardiac evaluation because of concerns about her blood pressure medications, and fear concerning an upcoming operation on her back.  She wants to be sure that her heart can withstand the procedure.  She has no history of symptoms consistent with angina, heart failure, arrhythmia, claudication, or transient neurological ischemia.  She specifically denies orthopnea.  She is not very active because of chronic pain related to lumbar disc disease.  There is no significant lower extremity edema.  She denies chest discomfort/indigestion.  No episodes of syncope or palpitation have been identified.  With reference to the blood pressure, she brought in copies of recent recordings most of which are below 140/90.  There are occasional spikes above 150 mmHg.  No timing or relationship to medical therapy is noted.  Again she is in chronic pain and is on anti-inflammatory therapy.  Both of the latter issues can increase blood pressure.  She is concerned about being on 3 different agents for control.  Past Medical History:  Diagnosis Date  . Arthritis   . Chronic pain   . Headache   . Hypercholesteremia   . Hypertension   . Spinal stenosis   . Thyroid disease     Past Surgical History:  Procedure Laterality Date  . NECK SURGERY     [id number]    Current Medications: Outpatient Medications Prior to Visit  Medication Sig Dispense Refill  . aspirin EC 81 MG tablet Take 81 mg by mouth daily.    Marland Kitchen atorvastatin (LIPITOR) 20 MG tablet Take 20 mg by mouth daily.    .  Cholecalciferol (VITAMIN D3) 2000 UNITS TABS Take 1 tablet by mouth daily.    . diclofenac (VOLTAREN) 75 MG EC tablet Take 75 mg by mouth 2 (two) times daily.    . diclofenac sodium (VOLTAREN) 1 % GEL Apply 2 g topically 4 (four) times daily as needed (pain).    . Hydrocodone-Acetaminophen 5-300 MG TABS Take 5-300 mg by mouth every 6 (six) hours as needed (pain).     . irbesartan (AVAPRO) 300 MG tablet Take 300 mg by mouth daily.   12  . iron polysaccharides (NU-IRON) 150 MG capsule Take 150 mg by mouth 2 (two) times daily.    Marland Kitchen levothyroxine (SYNTHROID, LEVOTHROID) 112 MCG tablet Take 112 mcg by mouth daily before breakfast.    . metoprolol succinate (TOPROL-XL) 25 MG 24 hr tablet Take 25 mg by mouth daily.     . Multiple Vitamin (MULTIVITAMIN WITH MINERALS) TABS Take 1 tablet by mouth daily.    . Omega-3 Fatty Acids (FISH OIL) 1200 MG CAPS Take 1 capsule by mouth daily.     . solifenacin (VESICARE) 5 MG tablet Take 5 mg by mouth every other day. Currently not taking (06-10-17)    . terbinafine (LAMISIL) 250 MG tablet Take 1 tablet (250 mg total) by mouth daily. 90 tablet 0  . tiZANidine (ZANAFLEX) 4 MG tablet Take half to one (2 to 4 mg) by mouth daily at bedtime.    Marland Kitchen  triamterene-hydrochlorothiazide (MAXZIDE-25) 37.5-25 MG per tablet Take 1 tablet by mouth daily.    . diclofenac sodium (VOLTAREN) 1 % GEL APP 2 GRAMS EXT AA QID P  2  . levothyroxine (SYNTHROID, LEVOTHROID) 125 MCG tablet Take 115 mcg by mouth daily.     . VESICARE 5 MG tablet TK 1 T PO  D  11   No facility-administered medications prior to visit.      Allergies:   Other and Macrobid [nitrofurantoin monohydrate macrocrystals]   Social History   Socioeconomic History  . Marital status: Married    Spouse name: Lauren Garrett  . Number of children: 2  . Years of education: master's   . Highest education level: None  Social Needs  . Financial resource strain: None  . Food insecurity - worry: None  . Food insecurity - inability:  None  . Transportation needs - medical: None  . Transportation needs - non-medical: None  Occupational History  . Occupation: Rtired     Fish farm manager: RETIRED  Tobacco Use  . Smoking status: Never Smoker  . Smokeless tobacco: Never Used  Substance and Sexual Activity  . Alcohol use: No  . Drug use: No  . Sexual activity: None  Other Topics Concern  . None  Social History Narrative   Patient lives at home spouse.   Caffeine Use: rarely     Family History:  The patient's family history includes Dementia in her mother; Lung cancer in her father; Migraines in her daughter.   ROS:   Please see the history of present illness.    Multiple non-cardiac complaints including neck discomfort, low back pain, tingling and burning in her feet and hands.  Difficulty with memory.  Difficulty sleeping.  She is fearful that she will have a complication during surgery.  She will also have a cardiologist on standby.  She is concerned about bilateral lower extremity spider veins.  She denies history of diabetes although it is listed from her primary physician as a problem. All other systems reviewed and are negative.   PHYSICAL EXAM:   VS:  BP 136/68   Pulse 60   Ht 5' 7.5" (1.715 m)   Wt 170 lb 3.2 oz (77.2 kg)   BMI 26.26 kg/m    GEN: Well nourished, well developed, in no acute distress  HEENT: normal  Neck: no JVD, carotid bruits, or masses Cardiac: RRR; no murmurs, rubs, or gallops,no edema  Respiratory:  clear to auscultation bilaterally, normal work of breathing GI: soft, nontender, nondistended, + BS MS: no deformity or atrophy  Skin: warm and dry, no rash Neuro:  Alert and Oriented x 3, Strength and sensation are intact Psych: euthymic mood, full affect  Wt Readings from Last 3 Encounters:  06/10/17 170 lb 3.2 oz (77.2 kg)  02/12/17 170 lb 3.2 oz (77.2 kg)  01/12/17 165 lb (74.8 kg)      Studies/Labs Reviewed:   EKG:  EKG sinus rhythm, nonspecific ST abnormality, diffuse.   Otherwise normal-appearing.  No evidence of prior infarction or conduction abnormality.  Recent Labs: 01/15/2017: ALT 25 02/12/2017: TSH 1.830   Lipid Panel No results found for: CHOL, TRIG, HDL, CHOLHDL, VLDL, LDLCALC, LDLDIRECT  Additional studies/ records that were reviewed today include:  Hgb A1C 5.4    ASSESSMENT:    1. Preoperative cardiovascular examination   2. Essential hypertension   3. Hypercholesteremia      PLAN:  In order of problems listed above:  1. Planned surgical procedure is low to  moderate distress.  She has few risk factors for ischemic heart disease.  Overall feel risk with general anesthesia will be low.  No testing is required and patient is cleared for the upcoming 3 level lumbar surgical procedure by Dr. Christella Garrett. 2. Discussed her blood pressure in detail.  No changes need to be made and there is adequate control.  We discussed influences of environmental factors, nonsteroidal anti-inflammatory therapy, and chronic pain on blood pressure variations.  Reassured the patient and encouraged her to continue compliance with the current 3 drug regimen.  Clinical follow-up as needed if cardiac symptoms develop or complications occur.    Medication Adjustments/Labs and Tests Ordered: Current medicines are reviewed at length with the patient today.  Concerns regarding medicines are outlined above.  Medication changes, Labs and Tests ordered today are listed in the Patient Instructions below. Patient Instructions  Medication Instructions:  Your physician recommends that you continue on your current medications as directed. Please refer to the Current Medication list given to you today.  Labwork: None  Testing/Procedures: None  Follow-Up: Your physician recommends that you schedule a follow-up appointment as needed with Dr. Tamala Julian.    Any Other Special Instructions Will Be Listed Below (If Applicable).     If you need a refill on your cardiac  medications before your next appointment, please call your pharmacy.      Signed, Lauren Grooms, MD  06/10/2017 2:56 PM    [facility] [place], [place], [place]  [address] Phone: [phone]; Fax: [phone]

## 2017-06-10 ENCOUNTER — Ambulatory Visit: Payer: Medicare Other | Admitting: Interventional Cardiology

## 2017-06-10 ENCOUNTER — Encounter: Payer: Self-pay | Admitting: Interventional Cardiology

## 2017-06-10 VITALS — BP 136/68 | HR 60 | Ht 67.5 in | Wt 170.2 lb

## 2017-06-10 DIAGNOSIS — E7800 Pure hypercholesterolemia, unspecified: Secondary | ICD-10-CM

## 2017-06-10 DIAGNOSIS — I10 Essential (primary) hypertension: Secondary | ICD-10-CM

## 2017-06-10 DIAGNOSIS — Z01810 Encounter for preprocedural cardiovascular examination: Secondary | ICD-10-CM | POA: Diagnosis not present

## 2017-06-10 NOTE — Patient Instructions (Signed)
Medication Instructions:  Your physician recommends that you continue on your current medications as directed. Please refer to the Current Medication list given to you today.  Labwork: None  Testing/Procedures: None  Follow-Up: Your physician recommends that you schedule a follow-up appointment as needed with Dr. Smith.     Any Other Special Instructions Will Be Listed Below (If Applicable).     If you need a refill on your cardiac medications before your next appointment, please call your pharmacy.   

## 2017-06-25 IMAGING — CT CT HEAD W/O CM
1 series · 16 of 30 positions shown, 20 images · non-contrast
Comparison: None.

CLINICAL DATA: Dizziness.

EXAM:
CT HEAD WITHOUT CONTRAST
TECHNIQUE: Contiguous axial images were obtained from the base of the skull
through the vertex without intravenous contrast.

[Series 2: head wo · axial · 0.43mm/px · z∈[-198,-63]mm · 16 of 33 slices shown, 20 images]
[im 2/33  brain]
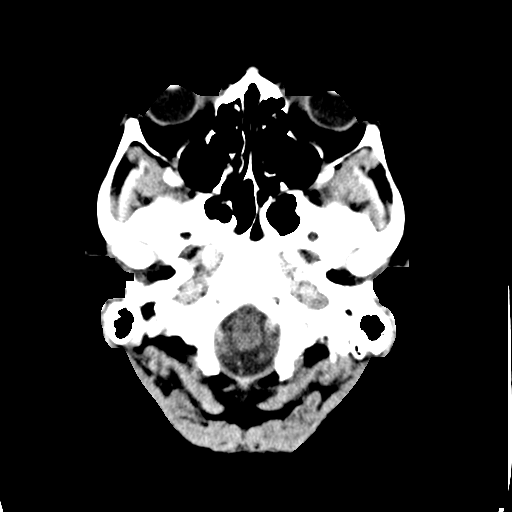
[im 2/33  bone]
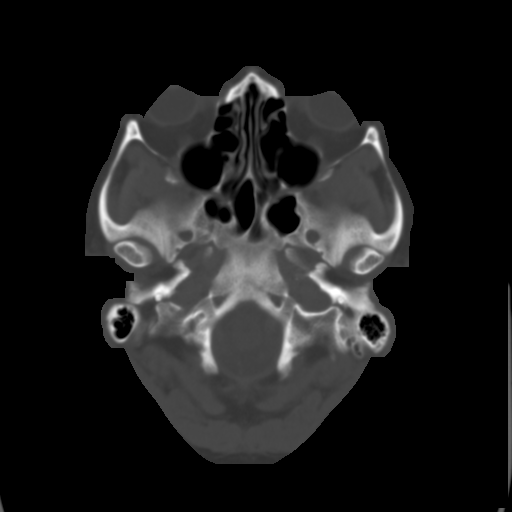
[im 4/33  brain]
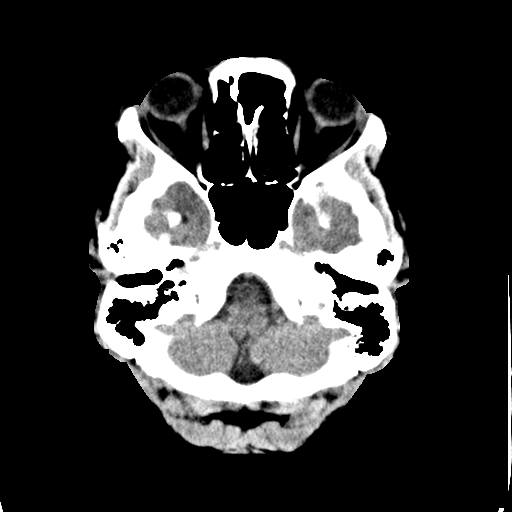
[im 6/33  brain]
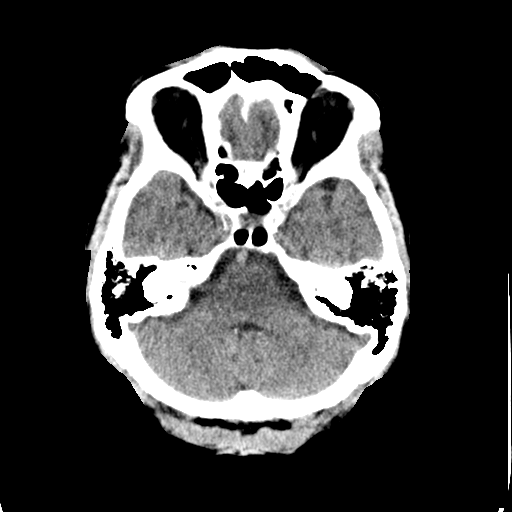
[im 8/33  brain]
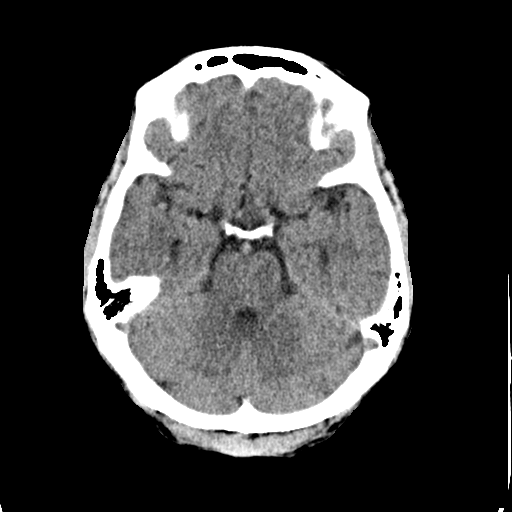
[im 9/33  brain]
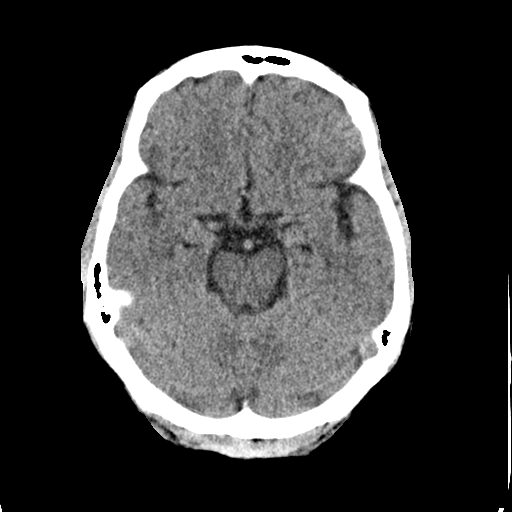
[im 9/33  bone]
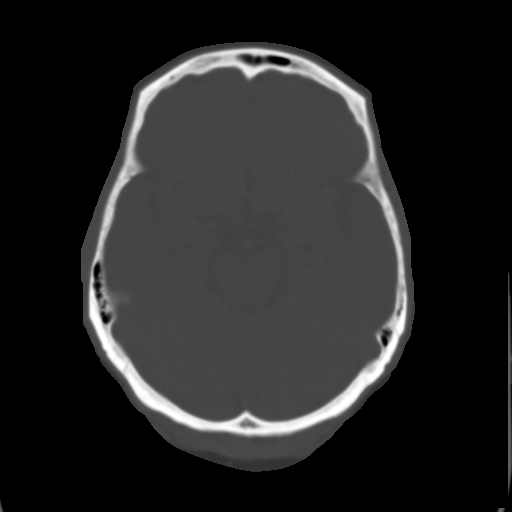
[im 12/33  brain]
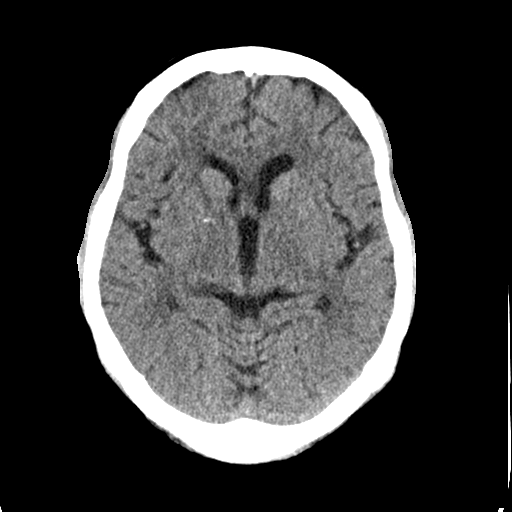
[im 14/33  brain]
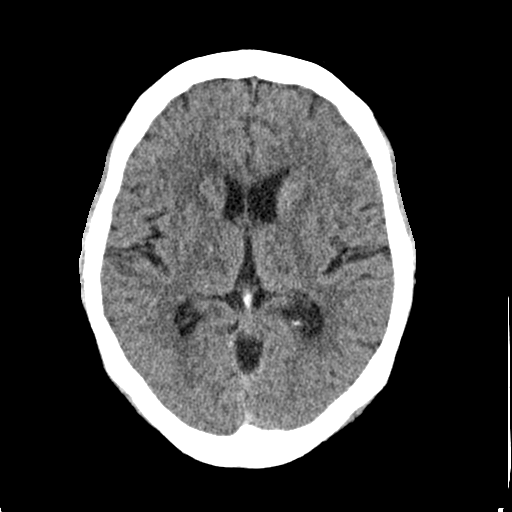
[im 16/33  brain]
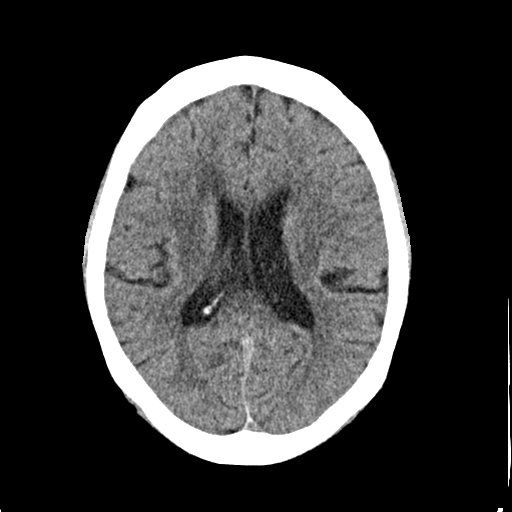
[im 17/33  brain]
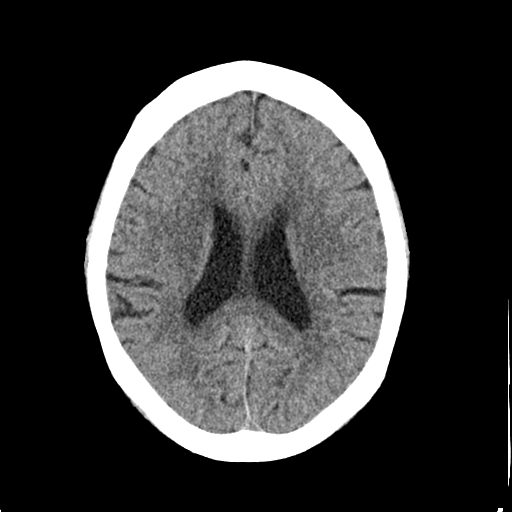
[im 17/33  bone]
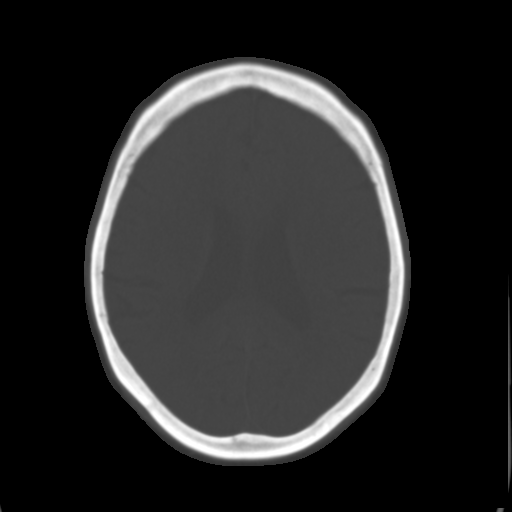
[im 19/33  brain]
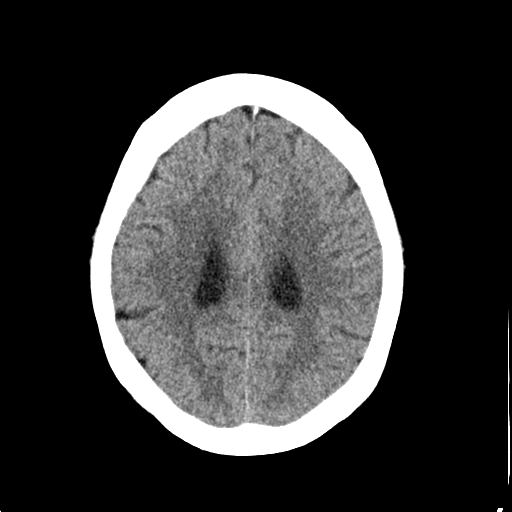
[im 21/33  brain]
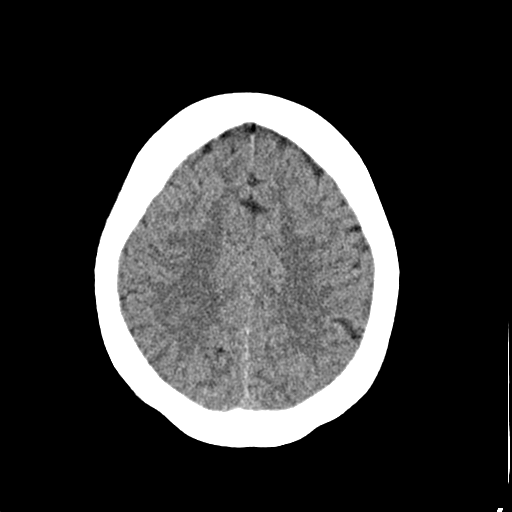
[im 24/33  brain]
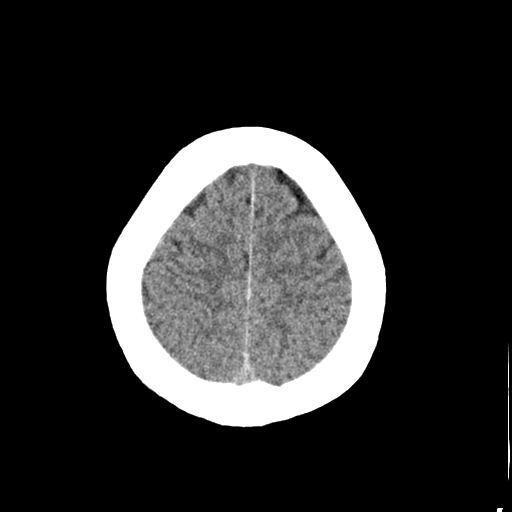
[im 25/33  brain]
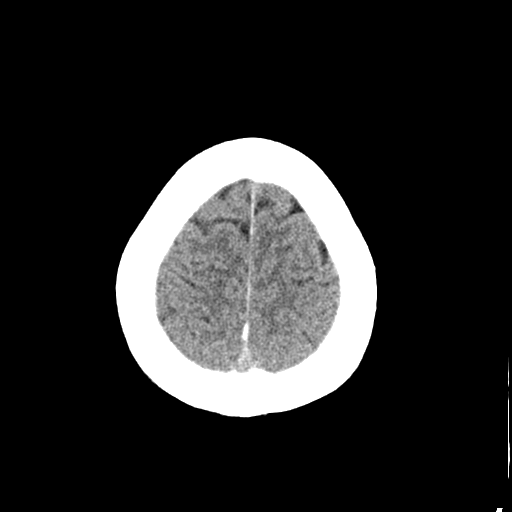
[im 25/33  bone]
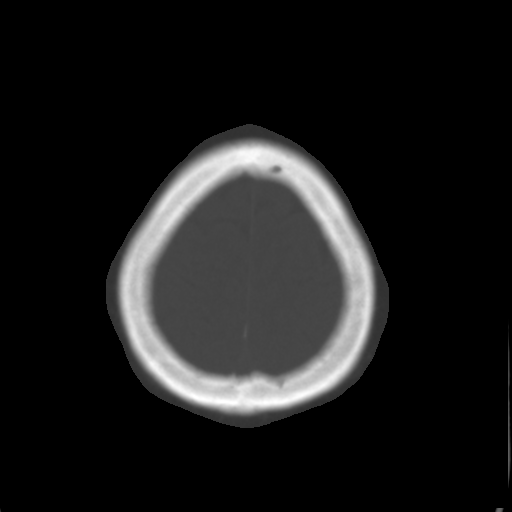
[im 27/33  brain]
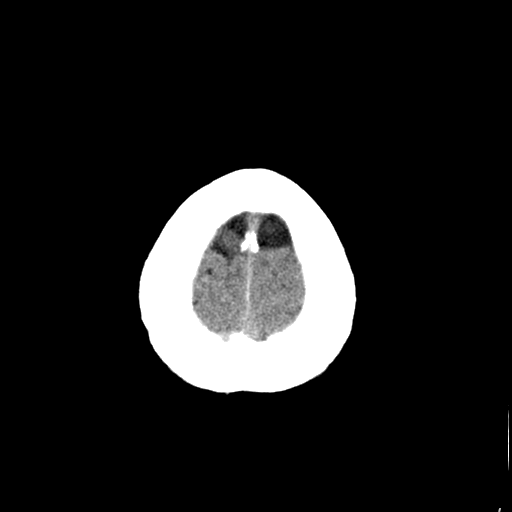
[im 29/33  brain]
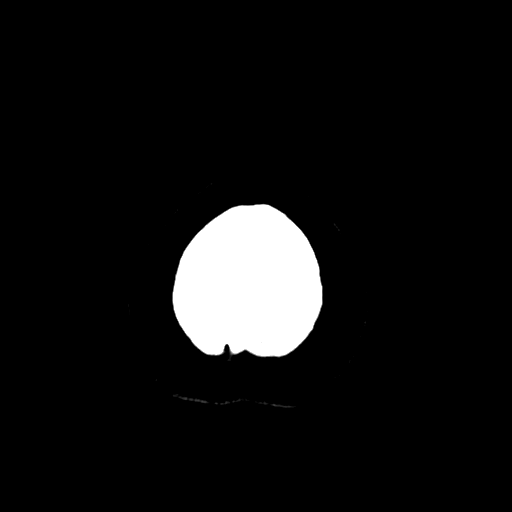
[im 31/33  brain]
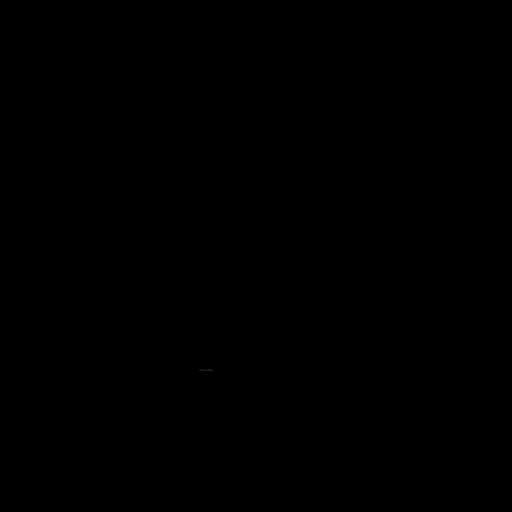

[16 of 30 positions shown; findings below may reference images not displayed]

FINDINGS: Ventricle size normal.  Cerebral volume normal for age.

Mild chronic microvascular ischemic change in the white matter. No
acute infarct. Negative for intracranial hemorrhage or mass. No
edema or shift of the midline structures.

Negative calvarium.
IMPRESSION: Chronic microvascular ischemic changes.  No acute abnormality.

## 2017-07-22 ENCOUNTER — Other Ambulatory Visit: Payer: Self-pay

## 2017-07-22 ENCOUNTER — Emergency Department (HOSPITAL_BASED_OUTPATIENT_CLINIC_OR_DEPARTMENT_OTHER)
Admission: EM | Admit: 2017-07-22 | Discharge: 2017-07-22 | Disposition: A | Discharge status: Home / Self Care | Payer: Medicare Other | Attending: Emergency Medicine | Admitting: Emergency Medicine

## 2017-07-22 ENCOUNTER — Encounter (HOSPITAL_BASED_OUTPATIENT_CLINIC_OR_DEPARTMENT_OTHER): Payer: Self-pay | Admitting: Emergency Medicine

## 2017-07-22 DIAGNOSIS — Z7982 Long term (current) use of aspirin: Secondary | ICD-10-CM | POA: Insufficient documentation

## 2017-07-22 DIAGNOSIS — W57XXXA Bitten or stung by nonvenomous insect and other nonvenomous arthropods, initial encounter: Secondary | ICD-10-CM | POA: Diagnosis not present

## 2017-07-22 DIAGNOSIS — Y9289 Other specified places as the place of occurrence of the external cause: Secondary | ICD-10-CM | POA: Diagnosis not present

## 2017-07-22 DIAGNOSIS — S70362A Insect bite (nonvenomous), left thigh, initial encounter: Secondary | ICD-10-CM | POA: Diagnosis not present

## 2017-07-22 DIAGNOSIS — I10 Essential (primary) hypertension: Secondary | ICD-10-CM | POA: Diagnosis not present

## 2017-07-22 DIAGNOSIS — Y939 Activity, unspecified: Secondary | ICD-10-CM | POA: Diagnosis not present

## 2017-07-22 DIAGNOSIS — Z79899 Other long term (current) drug therapy: Secondary | ICD-10-CM | POA: Diagnosis not present

## 2017-07-22 DIAGNOSIS — Y999 Unspecified external cause status: Secondary | ICD-10-CM | POA: Insufficient documentation

## 2017-07-22 MED ORDER — DOXYCYCLINE HYCLATE 100 MG PO TABS
200.0000 mg | ORAL_TABLET | Freq: Once | ORAL | Status: DC
  Filled 2017-07-22: qty 2

## 2017-07-22 NOTE — ED Triage Notes (Signed)
Pt has tick on upper thigh

## 2017-07-22 NOTE — ED Provider Notes (Signed)
[place] EMERGENCY DEPARTMENT Provider Note   CSN: [id number] Arrival date & time: 07/22/17  1833     History   Chief Complaint Chief Complaint  Patient presents with  . Tick Removal    HPI Lauren Garrett is a 72 y.o. female.  HPI  Patient is a 72 year old female with a history of hypertension, hypercholesterolemia, spinal stenosis, and lumbar disc herniation presenting for a tick embedded in her left thigh.  Patient does not know when the tick bit her, but just noticed it today.  Patient reports the last time she was outside in brush was 4 days ago.  Patient denies any erythema around the site of the bite, joint pain, fevers, chills, other rashes or headaches.  Past Medical History:  Diagnosis Date  . Arthritis   . Chronic pain   . Headache   . Hypercholesteremia   . Hypertension   . Spinal stenosis   . Thyroid disease     Patient Active Problem List   Diagnosis Date Noted  . Elevated blood pressure reading in office without diagnosis of hypertension 06/09/2017  . 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 Dizziness and giddiness 09/27/2015  . Tension headache 03/23/2013  . Disturbance of skin sensation 02/02/2013  . H/O cervical spine surgery 02/02/2013  . Degeneration of lumbar or lumbosacral intervertebral disc 02/02/2013  . Generalized anxiety disorder 02/02/2013  . Hypercholesteremia     Past Surgical History:  Procedure Laterality Date  . NECK SURGERY     [id number]     OB History   None      Home Medications    Prior to Admission medications   Medication Sig Start Date End Date Taking? Authorizing Provider  aspirin EC 81 MG tablet Take 81 mg by mouth daily.    [provider]  atorvastatin (LIPITOR) 20 MG tablet Take 20 mg by mouth daily.    [provider]  Cholecalciferol (VITAMIN D3) 2000 UNITS TABS Take 1 tablet by mouth daily.    [provider]  diclofenac (VOLTAREN) 75 MG EC tablet Take 75 mg by mouth 2 (two) times  daily.    [provider]  diclofenac sodium (VOLTAREN) 1 % GEL Apply 2 g topically 4 (four) times daily as needed (pain).    [provider]  Hydrocodone-Acetaminophen 5-300 MG TABS Take 5-300 mg by mouth every 6 (six) hours as needed (pain).     [provider]  irbesartan (AVAPRO) 300 MG tablet Take 300 mg by mouth daily.  07/28/16   [provider]  iron polysaccharides (NU-IRON) 150 MG capsule Take 150 mg by mouth 2 (two) times daily.    [provider]  levothyroxine (SYNTHROID, LEVOTHROID) 112 MCG tablet Take 112 mcg by mouth daily before breakfast.    [provider]  metoprolol succinate (TOPROL-XL) 25 MG 24 hr tablet Take 25 mg by mouth daily.     [provider]  Multiple Vitamin (MULTIVITAMIN WITH MINERALS) TABS Take 1 tablet by mouth daily.    [provider]  Omega-3 Fatty Acids (FISH OIL) 1200 MG CAPS Take 1 capsule by mouth daily.     [provider]  solifenacin (VESICARE) 5 MG tablet Take 5 mg by mouth every other day. Currently not taking (06-10-17)    [provider]  terbinafine (LAMISIL) 250 MG tablet Take 1 tablet (250 mg total) by mouth daily. 01/15/17   Wallene Huh, DPM  tiZANidine (ZANAFLEX) 4 MG tablet Take half to one (2  to 4 mg) by mouth daily at bedtime.    [provider]  triamterene-hydrochlorothiazide (MAXZIDE-25) 37.5-25 MG per tablet Take 1 tablet by mouth daily.    [provider]    Family History Family History  Problem Relation Age of Onset  . Dementia Mother   . Lung cancer Father   . Migraines Daughter   . Breast cancer Neg Hx     Social History Social History   Tobacco Use  . Smoking status: Never Smoker  . Smokeless tobacco: Never Used  Substance Use Topics  . Alcohol use: No  . Drug use: No     Allergies   Other and Macrobid [nitrofurantoin monohydrate macrocrystals]   Review of Systems Review of Systems  Constitutional:  Negative for chills and fever.  Musculoskeletal: Negative for joint swelling and myalgias.  Skin:       +insect bite     Physical Exam Updated Vital Signs BP (!) 169/83   Pulse 73   Temp 98.3 F (36.8 C) (Oral)   Resp 16   Ht _0  (1.702 m)   Wt 77.1 kg (170 lb)   SpO2 100%   BMI 26.63 kg/m   Physical Exam  Constitutional: She appears well-developed and well-nourished. No distress.  Sitting comfortably in bed.  HENT:  Head: Normocephalic and atraumatic.  Eyes: Conjunctivae are normal. Right eye exhibits no discharge. Left eye exhibits no discharge.  EOMs normal to gross examination.  Neck: Normal range of motion.  Cardiovascular: Normal rate and regular rhythm.  Intact, 2+ radial pulse.  Pulmonary/Chest:  Normal respiratory effort. Patient converses comfortably. No audible wheeze or stridor.  Abdominal: She exhibits no distension.  Musculoskeletal: Normal range of motion.  Neurological: She is alert.  Cranial nerves intact to gross observation. Patient moves extremities without difficulty.  Skin: Skin is warm and dry. She is not diaphoretic.  Tick body extruding from the left anterior thigh.  No engorgement of tick. Minimal surrounding erythema.  No other ticks identified on legs, arms, trunk, or neck.  Psychiatric: She has a normal mood and affect. Her behavior is normal. Judgment and thought content normal.  Nursing note and vitals reviewed.    ED Treatments / Results  Labs (all labs ordered are listed, but only abnormal results are displayed) Labs Reviewed - No data to display  EKG None  Radiology No results found.  Procedures Procedures (including critical care time)  Medications Ordered in ED Medications - No data to display   Initial Impression / Assessment and Plan / ED Course  I have reviewed the triage vital signs and the nursing notes.  Pertinent labs & imaging results that were available during my care of the patient were reviewed by me and  considered in my medical decision making (see chart for details).     Patient well-appearing in no acute distress.  Whole tick removed from left anterior thigh.  Minimal surrounding erythema, likely reactive.  Site cleansed.  Engaged in shared decision making with patient and her spouse regarding prophylactic doxycycline treatment, given the tick was not engorged, and no other high risk criteria met per CDC recommendations for prophylactic doxycycline.  Patient and her husband elected to refrain from treatment.  Patient was given return precautions for any spreading erythema from the site, joint pain or swelling, or further rashes, or fever chills.  Patient is in understanding and agrees with plan of care.  Tetanus status not verified  prior to leaving, but notes sent to patient's  primary care provider that this may need to be updated if it is not up-to-date.  This is a shared visit with Dr. Deno Etienne. Patient was independently evaluated by this attending physician. Attending physician consulted in evaluation and discharge management.  Final Clinical Impressions(s) / ED Diagnoses   Final diagnoses:  Tick bite, initial encounter    ED Discharge Orders    None       Tamala Julian 07/22/17 2122    Deno Etienne, DO 07/23/17 1604

## 2017-07-22 NOTE — Discharge Instructions (Signed)
Please see the information and instructions below regarding your visit.  Your diagnoses today include:  1. Tick bite, initial encounter    We removed a tick from you today.  Please see the attached information on the conditions to be monitoring.  Tests performed today include: See side panel of your discharge paperwork for testing performed today. Vital signs are listed at the bottom of these instructions.   Medications prescribed:    Take any prescribed medications only as prescribed, and any over the counter medications only as directed on the packaging.   You are given a one-time dose of doxycycline to prevent tickborne illnesses.  Please take probiotics over-the-counter and yogurt to prevent diarrhea from this.  Doxycycline is an antibiotic that fights infection. This medication can make your skin sensitive to the sun, so please ensure that you wear sunscreen, hats, or other coverage over your skin while taking this. This medicine CANNOT be taken by women while pregnant, breastfeeding, or trying to become pregnant.  Please speak with a healthcare provider if any of these situations apply to you.  Home care instructions:  Please follow any educational materials contained in this packet.   Follow-up instructions: Please follow-up with your primary care provider in one week for further evaluation of your symptoms if they are not completely improved.   Return instructions:  Please return to the Emergency Department if you experience worsening symptoms.  Please return to the emergency department if you develop any redness around the site where the tick was pulled from, joint pain or swelling, diffuse rashes that are red and spotty, rashes on the palms or soles, headaches, or fever or chills. Please return if you have any other emergent concerns.  Additional Information:   Your vital signs today were: BP (!) 169/83    Pulse 73    Temp 98.3 F (36.8 C) (Oral)    Resp 16    Ht 5\' 7"   (1.702 m)    Wt 77.1 kg (170 lb)    SpO2 100%    BMI 26.63 kg/m  If your blood pressure (BP) was elevated on multiple readings during this visit above 130 for the top number or above 80 for the bottom number, please have this repeated by your primary care provider within one month. --------------  Thank you for allowing [place] to participate in your care today.

## 2017-09-15 ENCOUNTER — Other Ambulatory Visit: Payer: Self-pay | Admitting: Obstetrics and Gynecology

## 2017-09-15 ENCOUNTER — Other Ambulatory Visit (HOSPITAL_COMMUNITY)
Admission: RE | Admit: 2017-09-15 | Discharge: 2017-09-15 | Disposition: A | Discharge status: Home / Self Care | Payer: Medicare Other | Source: Ambulatory Visit | Attending: Obstetrics and Gynecology | Admitting: Obstetrics and Gynecology

## 2017-09-15 DIAGNOSIS — Z01411 Encounter for gynecological examination (general) (routine) with abnormal findings: Secondary | ICD-10-CM | POA: Diagnosis present

## 2017-09-16 LAB — CYTOLOGY - PAP
DIAGNOSIS: NEGATIVE
HPV: NOT DETECTED

## 2017-11-06 ENCOUNTER — Other Ambulatory Visit: Payer: Self-pay | Admitting: Obstetrics and Gynecology

## 2017-11-06 DIAGNOSIS — Z1231 Encounter for screening mammogram for malignant neoplasm of breast: Secondary | ICD-10-CM

## 2017-11-11 ENCOUNTER — Ambulatory Visit
Admission: RE | Admit: 2017-11-11 | Discharge: 2017-11-11 | Disposition: A | Discharge status: Home / Self Care | Payer: Medicare Other | Source: Ambulatory Visit | Attending: Obstetrics and Gynecology | Admitting: Obstetrics and Gynecology

## 2017-11-11 DIAGNOSIS — Z1231 Encounter for screening mammogram for malignant neoplasm of breast: Secondary | ICD-10-CM

## 2017-11-19 ENCOUNTER — Ambulatory Visit: Payer: Self-pay

## 2017-12-31 ENCOUNTER — Other Ambulatory Visit: Payer: Self-pay | Admitting: Neurosurgery

## 2017-12-31 DIAGNOSIS — M48062 Spinal stenosis, lumbar region with neurogenic claudication: Secondary | ICD-10-CM

## 2018-01-21 ENCOUNTER — Ambulatory Visit
Admission: RE | Admit: 2018-01-21 | Discharge: 2018-01-21 | Disposition: A | Discharge status: Home / Self Care | Payer: Medicare Other | Source: Ambulatory Visit | Attending: Neurosurgery | Admitting: Neurosurgery

## 2018-01-21 DIAGNOSIS — M48062 Spinal stenosis, lumbar region with neurogenic claudication: Secondary | ICD-10-CM

## 2018-01-21 MED ORDER — IOPAMIDOL (ISOVUE-M 200) INJECTION 41%
1.0000 mL | Freq: Once | INTRAMUSCULAR | Status: AC
  Administered 2018-01-21: 1 mL via EPIDURAL

## 2018-01-21 MED ORDER — METHYLPREDNISOLONE ACETATE 40 MG/ML INJ SUSP (RADIOLOG
120.0000 mg | Freq: Once | INTRAMUSCULAR | Status: AC
  Administered 2018-01-21: 120 mg via EPIDURAL

## 2018-01-21 NOTE — Discharge Instructions (Signed)

## 2018-09-20 ENCOUNTER — Other Ambulatory Visit: Payer: Self-pay | Admitting: Obstetrics and Gynecology

## 2018-09-20 DIAGNOSIS — E2839 Other primary ovarian failure: Secondary | ICD-10-CM

## 2018-09-20 DIAGNOSIS — Z1231 Encounter for screening mammogram for malignant neoplasm of breast: Secondary | ICD-10-CM

## 2018-11-30 ENCOUNTER — Ambulatory Visit: Payer: Medicare Other | Admitting: Orthopaedic Surgery

## 2018-12-02 ENCOUNTER — Ambulatory Visit
Admission: RE | Admit: 2018-12-02 | Discharge: 2018-12-02 | Disposition: A | Discharge status: Home / Self Care | Payer: Medicare Other | Source: Ambulatory Visit | Attending: Obstetrics and Gynecology | Admitting: Obstetrics and Gynecology

## 2018-12-02 ENCOUNTER — Other Ambulatory Visit: Payer: Self-pay

## 2018-12-02 DIAGNOSIS — Z1231 Encounter for screening mammogram for malignant neoplasm of breast: Secondary | ICD-10-CM

## 2018-12-02 DIAGNOSIS — E2839 Other primary ovarian failure: Secondary | ICD-10-CM

## 2018-12-07 ENCOUNTER — Ambulatory Visit: Payer: Medicare Other | Admitting: Orthopaedic Surgery

## 2018-12-17 ENCOUNTER — Ambulatory Visit: Payer: Medicare Other | Admitting: Podiatry

## 2019-01-11 ENCOUNTER — Telehealth: Payer: Self-pay | Admitting: Neurology

## 2019-01-11 NOTE — Telephone Encounter (Signed)
Have her see her primary MD or ortho as it sounds musculoskeletal and not neurological

## 2019-01-11 NOTE — Telephone Encounter (Signed)
Pt called stating that she is having problems with her knees and that she has noticed that her R knee is turning inward. Pt is aware that she may need a referral since this is not what she had been treated for in the past.

## 2019-01-12 NOTE — Telephone Encounter (Signed)
revised 

## 2019-01-12 NOTE — Telephone Encounter (Addendum)
I called pt about her having problems with her knee and it turn inward.I stated per Dr.Sethi he recommend she seek her PCP or orthopedic MD because it sounds musculoskeletal not neurological. When I gave pt the recommendation she reported that was not what she told the person in the phone room. She reported her leg is tingling and feels like needles and pins in her feet . I stated to pt we have treated her for mild memory issues, dizzy spells, and headache in the past. Pt reported she call her PCP and on a telephone call it was recommend to call Dr. Leonie Man. Pt stated she is schedule to have surgery with neurosurgery but wanted to get her legs and feet evaluated for tingling and burning.NOted pt has spinal stenosis and has had several infusions, radiculopathy.I stated referral will have to be sent over from primary doctor. PT verbalized understanding.

## 2019-01-12 NOTE — Telephone Encounter (Signed)
ok 

## 2019-02-07 ENCOUNTER — Ambulatory Visit: Payer: Medicare Other | Admitting: Orthopaedic Surgery

## 2019-03-15 ENCOUNTER — Telehealth: Payer: Self-pay | Admitting: Neurology

## 2019-03-15 NOTE — Telephone Encounter (Signed)
I called patient regarding rescheduling 12/14 appointment d/t MD out. Patient states she will call back to reschedule this. Patient is aware MD next available is the week of January 11th and is okay with this.

## 2019-03-30 ENCOUNTER — Ambulatory Visit: Payer: Medicare Other | Admitting: Orthopaedic Surgery

## 2019-04-04 ENCOUNTER — Institutional Professional Consult (permissible substitution): Payer: Self-pay | Admitting: Neurology

## 2019-05-19 ENCOUNTER — Ambulatory Visit: Payer: Self-pay | Admitting: Neurology

## 2019-05-23 ENCOUNTER — Telehealth: Payer: Self-pay

## 2019-05-23 NOTE — Telephone Encounter (Signed)
Patient called wanting to know if she could be advised if she should get the COVID vaccine. Patient states when filling out the questionnaire she had some arising questions about her memory/recall issues and wanted to touch base. Please follow up

## 2019-05-23 NOTE — Telephone Encounter (Signed)
I spoke with Dr .Leonie Man about pt wanting to know if she should get the COVID 19 vaccine. He stated pt was last seen 2018 and should call her PCP MD about if they recommend COVID 19. PT has not been treated by him since 2018 and was release back to PCP per last note.

## 2019-05-23 NOTE — Telephone Encounter (Signed)
IF pt calls back please tell her to contact her PCP about if she should get COVID 19 vaccination. We have not seen her since 2018 and did a work up for [employer] Per Dr. Leonie Man she was release back to PCP in 2018. Dr. Leonie Man will see her 06/01/2019 for her leg issues per last phone note.

## 2019-06-01 ENCOUNTER — Ambulatory Visit: Payer: Medicare Other | Admitting: Neurology

## 2019-06-01 ENCOUNTER — Telehealth: Payer: Self-pay

## 2019-06-01 ENCOUNTER — Telehealth: Payer: Self-pay | Admitting: Neurology

## 2019-06-01 NOTE — Telephone Encounter (Signed)
Pt has called to inform she is out of town but will call back to r/s

## 2019-06-01 NOTE — Telephone Encounter (Signed)
Pt cancel appt same day.Reason per telephone note she is out of town.This is a a no show.

## 2019-08-31 ENCOUNTER — Ambulatory Visit: Payer: Medicare PPO | Attending: Internal Medicine

## 2019-08-31 DIAGNOSIS — Z20822 Contact with and (suspected) exposure to covid-19: Secondary | ICD-10-CM | POA: Insufficient documentation

## 2019-09-01 LAB — NOVEL CORONAVIRUS, NAA: SARS-CoV-2, NAA: NOT DETECTED

## 2019-09-01 LAB — SARS-COV-2, NAA 2 DAY TAT

## 2019-09-21 DIAGNOSIS — R200 Anesthesia of skin: Secondary | ICD-10-CM | POA: Diagnosis not present

## 2019-09-21 DIAGNOSIS — Z01419 Encounter for gynecological examination (general) (routine) without abnormal findings: Secondary | ICD-10-CM | POA: Diagnosis not present

## 2019-10-18 ENCOUNTER — Telehealth: Payer: Self-pay | Admitting: Neurology

## 2019-10-18 NOTE — Telephone Encounter (Signed)
I spoke to the patient who stated that she was having numbness in both her feet and she had spoken to her primary care physician was recommended neurology visit.  I agree she needs to be seen and will give her an appointment when available.  I told her my nurse Katrina would call her back with an appointment but it may not be till the end of the month.

## 2019-10-18 NOTE — Telephone Encounter (Signed)
I spoke with Dr. Leonie Man and he call pt about her foot discoloration and numbness. Per Dr.SEthi pt spoke to PCP but was not seen by them for her new complaint of foot numbness and discoloration.Pt complain of tinging and numbness in her foot. He recommend pt may have a vascular issue if she is having discoloration. He advise pt to schedule appt with her PCP to have her foot evaluated.He advise pt that his nurse will call her for an appt to see him and put her on the cancellation list.

## 2019-10-18 NOTE — Telephone Encounter (Signed)
Patient presented to the lobby hoping to get an earlier apt than the next one available end of July. Complaint is of foot discoloration and numbness. Best call back [phone]

## 2019-10-19 ENCOUNTER — Emergency Department (HOSPITAL_COMMUNITY)
Admission: EM | Admit: 2019-10-19 | Discharge: 2019-10-20 | Disposition: A | Discharge status: Home / Self Care | Payer: Medicare PPO | Attending: Emergency Medicine | Admitting: Emergency Medicine

## 2019-10-19 ENCOUNTER — Encounter (HOSPITAL_COMMUNITY): Payer: Self-pay | Admitting: Emergency Medicine

## 2019-10-19 ENCOUNTER — Other Ambulatory Visit: Payer: Self-pay

## 2019-10-19 DIAGNOSIS — R202 Paresthesia of skin: Secondary | ICD-10-CM | POA: Diagnosis not present

## 2019-10-19 DIAGNOSIS — Z7982 Long term (current) use of aspirin: Secondary | ICD-10-CM | POA: Diagnosis not present

## 2019-10-19 DIAGNOSIS — L819 Disorder of pigmentation, unspecified: Secondary | ICD-10-CM | POA: Diagnosis not present

## 2019-10-19 DIAGNOSIS — R200 Anesthesia of skin: Secondary | ICD-10-CM | POA: Diagnosis present

## 2019-10-19 DIAGNOSIS — Z79899 Other long term (current) drug therapy: Secondary | ICD-10-CM | POA: Insufficient documentation

## 2019-10-19 DIAGNOSIS — I10 Essential (primary) hypertension: Secondary | ICD-10-CM | POA: Insufficient documentation

## 2019-10-19 LAB — CBC
HCT: 35.5 % — ABNORMAL LOW (ref 36.0–46.0)
Hemoglobin: 11.3 g/dL — ABNORMAL LOW (ref 12.0–15.0)
MCH: 31.3 pg (ref 26.0–34.0)
MCHC: 31.8 g/dL (ref 30.0–36.0)
MCV: 98.3 fL (ref 80.0–100.0)
Platelets: 358 10*3/uL (ref 150–400)
RBC: 3.61 MIL/uL — ABNORMAL LOW (ref 3.87–5.11)
RDW: 13.1 % (ref 11.5–15.5)
WBC: 4.6 10*3/uL (ref 4.0–10.5)
nRBC: 0 % (ref 0.0–0.2)

## 2019-10-19 LAB — BASIC METABOLIC PANEL
Anion gap: 8 (ref 5–15)
BUN: 25 mg/dL — ABNORMAL HIGH (ref 8–23)
CO2: 28 mmol/L (ref 22–32)
Calcium: 9.9 mg/dL (ref 8.9–10.3)
Chloride: 107 mmol/L (ref 98–111)
Creatinine, Ser: 1.12 mg/dL — ABNORMAL HIGH (ref 0.44–1.00)
GFR calc Af Amer: 57 mL/min — ABNORMAL LOW (ref >60)
GFR calc non Af Amer: 49 mL/min — ABNORMAL LOW (ref >60)
Glucose, Bld: 122 mg/dL — ABNORMAL HIGH (ref 70–99)
Potassium: 3.8 mmol/L (ref 3.5–5.1)
Sodium: 143 mmol/L (ref 135–145)

## 2019-10-19 NOTE — Telephone Encounter (Signed)
I called pt to schedule her for an appt. Pt stated Ronald Pippins schedule her for July 28 for an appt. I advise pt that she was not schedule by the front desk to see Dr.SEhi, I advise her that Dr Leonie Man spoke with me and recommend she see her PCP for the foot discoloration and him for the foot numbness. Pt stated she saw the PCP nurse and her issues were relayed to her PCP. Pt was insisting she was schedule on 7//28 with Dr Leonie Man. I stated his schedule was change that new issues and new pts are seen in the am and routine follow up are seen in the afternoon. Pt was last seen 2018 in our office. PT stated her foot has discoloration around the ankles and on the bottom of her soles. She is afraid and does not want to lose a foot. I advise pt if the discoloration is getting worse to seek the nearest ED for an evaluation and we can see her for the numbness. Pt appreciate the call and verbalized understanding. Pt was added to waiting list.

## 2019-10-19 NOTE — ED Triage Notes (Signed)
Pt c/o bilateral foot discoloration and numbness to bilateral feet, worse on the left. Hx spinal stenosis, sent today by Dr. Leonie Man for further evaluation. Both feet warm and dry, cap refill WNL, and +pulses. Some discoloration noted to the left foot. Pt ambulatory with cane to baseline, denies pain.

## 2019-10-20 ENCOUNTER — Ambulatory Visit (HOSPITAL_BASED_OUTPATIENT_CLINIC_OR_DEPARTMENT_OTHER)
Admission: RE | Admit: 2019-10-20 | Discharge: 2019-10-20 | Disposition: A | Discharge status: Home / Self Care | Payer: Medicare PPO | Source: Ambulatory Visit | Attending: Emergency Medicine | Admitting: Emergency Medicine

## 2019-10-20 DIAGNOSIS — M7989 Other specified soft tissue disorders: Secondary | ICD-10-CM

## 2019-10-20 DIAGNOSIS — R52 Pain, unspecified: Secondary | ICD-10-CM

## 2019-10-20 LAB — TSH: TSH: 1.936 u[IU]/mL (ref 0.350–4.500)

## 2019-10-20 LAB — D-DIMER, QUANTITATIVE: D-Dimer, Quant: 1.56 ug/mL-FEU — ABNORMAL HIGH (ref 0.00–0.50)

## 2019-10-20 NOTE — ED Provider Notes (Signed)
[place] EMERGENCY DEPARTMENT Provider Note   CSN: [id number] Arrival date & time: 10/19/19  1506     History Chief Complaint  Patient presents with  . Numbness    Lauren Garrett is a 74 y.o. female.  Patient presents to the emergency department with complaints of numbness to the bottoms of her feet and discoloration of the left ankle area.  She does have a history of spinal stenosis.  She reports that the numbness in her feet is new.  She does not have a history of diabetes.  Patient called her neurologist because of the numbness and was told to come to the ER for further evaluation.  She denies any injury.  She is not experiencing any increased back pain.        Past Medical History:  Diagnosis Date  . Arthritis   . Chronic pain   . Headache   . Hypercholesteremia   . Hypertension   . Spinal stenosis   . Thyroid disease     Patient Active Problem List   Diagnosis Date Noted  . Elevated blood pressure reading in office without diagnosis of hypertension 06/09/2017  . 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 Dizziness and giddiness 09/27/2015  . Tension headache 03/23/2013  . Disturbance of skin sensation 02/02/2013  . H/O cervical spine surgery 02/02/2013  . Degeneration of lumbar or lumbosacral intervertebral disc 02/02/2013  . Generalized anxiety disorder 02/02/2013  . Hypercholesteremia     Past Surgical History:  Procedure Laterality Date  . NECK SURGERY     9030,0923     OB History   No obstetric history on file.     Family History  Problem Relation Age of Onset  . Dementia Mother   . Lung cancer Father   . Migraines Daughter   . Breast cancer Neg Hx     Social History   Tobacco Use  . Smoking status: Never Smoker  . Smokeless tobacco: Never Used  Substance Use Topics  . Alcohol use: No  . Drug use: No    Home Medications Prior to Admission medications   Medication Sig Start Date End Date Taking? Authorizing Provider  aspirin  EC 81 MG tablet Take 81 mg by mouth daily.    [provider]  atorvastatin (LIPITOR) 20 MG tablet Take 20 mg by mouth daily.    [provider]  Cholecalciferol (VITAMIN D3) 2000 UNITS TABS Take 1 tablet by mouth daily.    [provider]  diclofenac (VOLTAREN) 75 MG EC tablet Take 75 mg by mouth 2 (two) times daily.    [provider]  diclofenac sodium (VOLTAREN) 1 % GEL Apply 2 g topically 4 (four) times daily as needed (pain).    [provider]  Hydrocodone-Acetaminophen 5-300 MG TABS Take 5-300 mg by mouth every 6 (six) hours as needed (pain).     [provider]  irbesartan (AVAPRO) 300 MG tablet Take 300 mg by mouth daily.  07/28/16   [provider]  iron polysaccharides (NU-IRON) 150 MG capsule Take 150 mg by mouth 2 (two) times daily.    [provider]  levothyroxine (SYNTHROID, LEVOTHROID) 112 MCG tablet Take 112 mcg by mouth daily before breakfast.    [provider]  metoprolol succinate (TOPROL-XL) 25 MG 24 hr tablet Take 25 mg by mouth daily.     [provider]  Multiple Vitamin (MULTIVITAMIN WITH MINERALS) TABS Take 1 tablet by mouth daily.    [provider]  Omega-3 Fatty Acids (FISH OIL) 1200 MG CAPS Take 1 capsule by mouth daily.     [provider]  solifenacin (VESICARE) 5 MG tablet Take 5 mg by mouth every other day. Currently not taking (06-10-17)    [provider]  terbinafine (LAMISIL) 250 MG tablet Take 1 tablet (250 mg total) by mouth daily. 01/15/17   Wallene Huh, DPM  tiZANidine (ZANAFLEX) 4 MG tablet Take half to one (2 to 4 mg) by mouth daily at bedtime.    [provider]  triamterene-hydrochlorothiazide (MAXZIDE-25) 37.5-25 MG per tablet Take 1 tablet by mouth daily.    [provider]    Allergies    Other and Macrobid [nitrofurantoin monohydrate macrocrystals]  Review of Systems   Review of Systems  Skin: Positive for  color change.  Neurological: Positive for numbness.  All other systems reviewed and are negative.   Physical Exam Updated Vital Signs BP 133/88   Pulse 68   Temp 98.2 F (36.8 C) (Oral)   Resp 17   Ht 5' 7.5" (1.715 m)   Wt 67.1 kg   SpO2 98%   BMI 22.84 kg/m   Physical Exam Vitals and nursing note reviewed.  Constitutional:      General: She is not in acute distress.    Appearance: Normal appearance. She is well-developed.  HENT:     Head: Normocephalic and atraumatic.     Right Ear: Hearing normal.     Left Ear: Hearing normal.     Nose: Nose normal.  Eyes:     Conjunctiva/sclera: Conjunctivae normal.     Pupils: Pupils are equal, round, and reactive to light.  Cardiovascular:     Rate and Rhythm: Regular rhythm.     Pulses:          Dorsalis pedis pulses are 2+ on the right side and 2+ on the left side.     Heart sounds: S1 normal and S2 normal. No murmur heard.  No friction rub. No gallop.   Pulmonary:     Effort: Pulmonary effort is normal. No respiratory distress.     Breath sounds: Normal breath sounds.  Chest:     Chest wall: No tenderness.  Abdominal:     General: Bowel sounds are normal.     Palpations: Abdomen is soft.     Tenderness: There is no abdominal tenderness. There is no guarding or rebound. Negative signs include Murphy's sign and McBurney's sign.     Hernia: No hernia is present.  Musculoskeletal:        General: Normal range of motion.     Cervical back: Normal range of motion and neck supple.  Skin:    General: Skin is warm and dry.     Comments: Patient has increased pigmentation on the anterior aspect of the left lower leg and dorsal aspect of the foot.  There is a very well demarcated line at the border of the increased pigmentation.  Neurological:     Mental Status: She is alert and oriented to person, place, and time.     GCS: GCS eye subscore is 4. GCS verbal subscore is 5. GCS motor subscore is 6.     Cranial Nerves: No cranial  nerve deficit.     Sensory: No sensory deficit.     Coordination: Coordination normal.  Psychiatric:        Speech: Speech normal.        Behavior: Behavior normal.  Thought Content: Thought content normal.     ED Results / Procedures / Treatments   Labs (all labs ordered are listed, but only abnormal results are displayed) Labs Reviewed  CBC - Abnormal; Notable for the following components:      Result Value   RBC 3.61 (*)    Hemoglobin 11.3 (*)    HCT 35.5 (*)    All other components within normal limits  BASIC METABOLIC PANEL - Abnormal; Notable for the following components:   Glucose, Bld 122 (*)    BUN 25 (*)    Creatinine, Ser 1.12 (*)    GFR calc non Af Amer 49 (*)    GFR calc Af Amer 57 (*)    All other components within normal limits  D-DIMER, QUANTITATIVE (NOT AT [facility]) - Abnormal; Notable for the following components:   D-Dimer, Quant 1.56 (*)    All other components within normal limits  TSH    EKG None  Radiology No results found.  Procedures Procedures (including critical care time)  Medications Ordered in ED Medications - No data to display  ED Course  I have reviewed the triage vital signs and the nursing notes.  Pertinent labs & imaging results that were available during my care of the patient were reviewed by me and considered in my medical decision making (see chart for details).    MDM Rules/Calculators/A&P                          Patient concerned about change in color of her foot as well as numbness of both of her feet.  She does have a history of spinal stenosis.  No significant change in her back pain.  She has not had any recent injury.  She has not noticed any change in strength of the lower extremities.  No change in bowel or bladder function.  Examination reveals subjective numbness of the plantar aspects of both feet but otherwise no focal neurologic findings.  I did offer to perform MRI tonight although I do not see any  significant findings that would make me think that the MRI is going to show an acute problem.  She would prefer to have an outpatient study ordered by her neurosurgeon, Dr. Christella Noa.  It appears that she was referred to the ED by her neurologist.  She did not see the neurologist.  There is nothing to indicate a limb threatening process tonight.  She has bounding distal pulses so no evidence of ischemia of the limb.  There is some change in coloration of the lower portion of the left leg and foot but this is not erythema or any thing that suggest infection.  It is a very linearly demarcated area, looks more like she had her lower leg exposed to sun when she was wearing some type of shoe that covered the distal aspect of her foot.  She denies this, however.  D-dimer was performed although she is felt to be low risk for DVT.  Will perform outpatient venous duplex in the morning.  Final Clinical Impression(s) / ED Diagnoses Final diagnoses:  Paresthesia    Rx / DC Orders ED Discharge Orders         Ordered    LE VENOUS        10/20/19 0327           Orpah Greek, MD 10/20/19 2353

## 2019-11-14 ENCOUNTER — Ambulatory Visit: Payer: Medicare PPO | Admitting: Neurology

## 2019-11-14 ENCOUNTER — Telehealth: Payer: Self-pay | Admitting: Neurology

## 2019-11-14 ENCOUNTER — Encounter: Payer: Self-pay | Admitting: Neurology

## 2019-11-14 VITALS — BP 157/75 | HR 90 | Ht 67.0 in | Wt 152.0 lb

## 2019-11-14 DIAGNOSIS — M5416 Radiculopathy, lumbar region: Secondary | ICD-10-CM | POA: Diagnosis not present

## 2019-11-14 DIAGNOSIS — R202 Paresthesia of skin: Secondary | ICD-10-CM | POA: Diagnosis not present

## 2019-11-14 MED ORDER — TOPIRAMATE 25 MG PO TABS: 25.0000 mg | ORAL_TABLET | Freq: Two times a day (BID) | ORAL | 3 refills | Status: DC
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
## 2019-11-14 NOTE — Telephone Encounter (Signed)
Pt was seen by md today Imaging will call to schedule mri Will call pt back when results are in

## 2019-11-14 NOTE — Patient Instructions (Signed)
I had a long discussion with patient and her husband regarding her feet paresthesias and right back and hip and leg pain and answered questions.  Recommend further evaluation by checking EMG nerve conduction studies for neuropathy as well as MRI scan of the lumbar spine to look for compressive radiculopathy.  Trial of Topamax 25 mg twice daily to help with paresthesias and radicular pain.  She may need to see Dr. Cyndy Freeze after the MRI to see if there is any surgical treatment for her condition.  She will return for follow-up in the future in 3 months or call earlier if necessary.  Radicular Pain Radicular pain is a type of pain that spreads from your back or neck along a spinal nerve. Spinal nerves are nerves that leave the spinal cord and go to the muscles. Radicular pain is sometimes called radiculopathy, radiculitis, or a pinched nerve. When you have this type of pain, you may also have weakness, numbness, or tingling in the area of your body that is supplied by the nerve. The pain may feel sharp and burning. Depending on which spinal nerve is affected, the pain may occur in the:  Neck area (cervical radicular pain). You may also feel pain, numbness, weakness, or tingling in the arms.  Mid-spine area (thoracic radicular pain). You would feel this pain in the back and chest. This type is rare.  Lower back area (lumbar radicular pain). You would feel this pain as low back pain. You may feel pain, numbness, weakness, or tingling in the buttocks or legs. Sciatica is a type of lumbar radicular pain that shoots down the back of the leg. Radicular pain occurs when one of the spinal nerves becomes irritated or squeezed (compressed). It is often caused by something pushing on a spinal nerve, such as one of the bones of the spine (vertebrae) or one of the round cushions between vertebrae (intervertebral disks). This can result from:  An injury.  Wear and tear or aging of a disk.  The growth of a bone spur  that pushes on the nerve. Radicular pain often goes away when you follow instructions from your health care provider for relieving pain at home. Follow these instructions at home: Managing pain      If directed, put ice on the affected area: ? Put ice in a plastic bag. ? Place a towel between your skin and the bag. ? Leave the ice on for 20 minutes, 2-3 times a day.  If directed, apply heat to the affected area as often as told by your health care provider. Use the heat source that your health care provider recommends, such as a moist heat pack or a heating pad. ? Place a towel between your skin and the heat source. ? Leave the heat on for 20-30 minutes. ? Remove the heat if your skin turns bright red. This is especially important if you are unable to feel pain, heat, or cold. You may have a greater risk of getting burned. Activity   Do not sit or rest in bed for long periods of time.  Try to stay as active as possible. Ask your health care provider what type of exercise or activity is best for you.  Avoid activities that make your pain worse, such as bending and lifting.  Do not lift anything that is heavier than 10 lb (4.5 kg), or the limit that you are told, until your health care provider says that it is safe.  Practice using proper technique when  lifting items. Proper lifting technique involves bending your knees and rising up.  Do strength and range-of-motion exercises only as told by your health care provider or physical therapist. General instructions  Take over-the-counter and prescription medicines only as told by your health care provider.  Pay attention to any changes in your symptoms.  Keep all follow-up visits as told by your health care provider. This is important. ? Your health care provider may send you to a physical therapist to help with this pain. Contact a health care provider if:  Your pain and other symptoms get worse.  Your pain medicine is not  helping.  Your pain has not improved after a few weeks of home care.  You have a fever. Get help right away if:  You have severe pain, weakness, or numbness.  You have difficulty with bladder or bowel control. Summary  Radicular pain is a type of pain that spreads from your back or neck along a spinal nerve.  When you have radicular pain, you may also have weakness, numbness, or tingling in the area of your body that is supplied by the nerve.  The pain may feel sharp or burning.  Radicular pain may be treated with ice, heat, medicines, or physical therapy. This information is not intended to replace advice given to you by your health care provider. Make sure you discuss any questions you have with your health care provider. Document Revised: 10/20/2017 Document Reviewed: 10/20/2017 Elsevier Patient Education  [employer].

## 2019-11-14 NOTE — Telephone Encounter (Signed)
Pt has additional questions regarding today's visit. Please advise at [phone].

## 2019-11-15 NOTE — Telephone Encounter (Signed)
Per patient she wants to go to [facility] for the Open MRI.  Mcarthur Rossetti Josem Kaufmann: [phone] (exp. 11/15/19 to 12/15/19) order faxed to [facility] they will reach out to the patient to schedule

## 2019-11-16 NOTE — Telephone Encounter (Signed)
Schedule for 11/22/19.

## 2019-11-22 DIAGNOSIS — E1129 Type 2 diabetes mellitus with other diabetic kidney complication: Secondary | ICD-10-CM | POA: Diagnosis not present

## 2019-11-22 DIAGNOSIS — Z0000 Encounter for general adult medical examination without abnormal findings: Secondary | ICD-10-CM | POA: Diagnosis not present

## 2019-11-22 DIAGNOSIS — M47817 Spondylosis without myelopathy or radiculopathy, lumbosacral region: Secondary | ICD-10-CM | POA: Diagnosis not present

## 2019-11-22 DIAGNOSIS — E041 Nontoxic single thyroid nodule: Secondary | ICD-10-CM | POA: Diagnosis not present

## 2019-11-22 DIAGNOSIS — E7800 Pure hypercholesterolemia, unspecified: Secondary | ICD-10-CM | POA: Diagnosis not present

## 2019-11-22 DIAGNOSIS — M47816 Spondylosis without myelopathy or radiculopathy, lumbar region: Secondary | ICD-10-CM | POA: Diagnosis not present

## 2019-11-23 ENCOUNTER — Other Ambulatory Visit: Payer: Self-pay | Admitting: Obstetrics and Gynecology

## 2019-11-23 DIAGNOSIS — Z1231 Encounter for screening mammogram for malignant neoplasm of breast: Secondary | ICD-10-CM

## 2019-11-24 DIAGNOSIS — H40023 Open angle with borderline findings, high risk, bilateral: Secondary | ICD-10-CM | POA: Diagnosis not present

## 2019-11-24 DIAGNOSIS — H524 Presbyopia: Secondary | ICD-10-CM | POA: Diagnosis not present

## 2019-11-25 DIAGNOSIS — Z1212 Encounter for screening for malignant neoplasm of rectum: Secondary | ICD-10-CM | POA: Diagnosis not present

## 2019-11-25 DIAGNOSIS — R82998 Other abnormal findings in urine: Secondary | ICD-10-CM | POA: Diagnosis not present

## 2019-11-25 DIAGNOSIS — I129 Hypertensive chronic kidney disease with stage 1 through stage 4 chronic kidney disease, or unspecified chronic kidney disease: Secondary | ICD-10-CM | POA: Diagnosis not present

## 2019-11-28 NOTE — Telephone Encounter (Signed)
I reviewed MRI L spine results done on 11/22/2019 at [facility] showing similar multrilevel spondylotic changes at L 3-4  Thru L5-S1 with unchanged degree of central canal stenosis similar to 01/05/2018. Severe central canal stenosis and moderate to severe right-sided foraminal stenosis at L3-4 and L4-5. I spoke to patient and gave her results.  I spoke to the patient and her husband and gave results and advised her to keep appointment for EMG nerve conduction study.  She also has an upcoming appointment with the primary care physician tomorrow and had some concerns about her kidney function I advised her to address this with Dr. Osborne Casco.  She also advised to see Dr. Cyndy Freeze spine surgeon to discuss with her that she had any surgical treatment options.

## 2019-11-29 ENCOUNTER — Ambulatory Visit: Payer: Self-pay | Admitting: Neurology

## 2019-11-29 DIAGNOSIS — D692 Other nonthrombocytopenic purpura: Secondary | ICD-10-CM | POA: Diagnosis not present

## 2019-11-29 DIAGNOSIS — N1831 Chronic kidney disease, stage 3a: Secondary | ICD-10-CM | POA: Diagnosis not present

## 2019-11-29 DIAGNOSIS — I129 Hypertensive chronic kidney disease with stage 1 through stage 4 chronic kidney disease, or unspecified chronic kidney disease: Secondary | ICD-10-CM | POA: Diagnosis not present

## 2019-11-29 DIAGNOSIS — I679 Cerebrovascular disease, unspecified: Secondary | ICD-10-CM | POA: Diagnosis not present

## 2019-11-29 DIAGNOSIS — E7800 Pure hypercholesterolemia, unspecified: Secondary | ICD-10-CM | POA: Diagnosis not present

## 2019-11-29 DIAGNOSIS — E039 Hypothyroidism, unspecified: Secondary | ICD-10-CM | POA: Diagnosis not present

## 2019-11-29 DIAGNOSIS — E1129 Type 2 diabetes mellitus with other diabetic kidney complication: Secondary | ICD-10-CM | POA: Diagnosis not present

## 2019-11-29 DIAGNOSIS — E1149 Type 2 diabetes mellitus with other diabetic neurological complication: Secondary | ICD-10-CM | POA: Diagnosis not present

## 2019-11-29 DIAGNOSIS — Z0000 Encounter for general adult medical examination without abnormal findings: Secondary | ICD-10-CM | POA: Diagnosis not present

## 2019-12-06 ENCOUNTER — Other Ambulatory Visit: Payer: Self-pay

## 2019-12-06 ENCOUNTER — Ambulatory Visit
Admission: RE | Admit: 2019-12-06 | Discharge: 2019-12-06 | Disposition: A | Discharge status: Home / Self Care | Payer: Medicare PPO | Source: Ambulatory Visit | Attending: Obstetrics and Gynecology | Admitting: Obstetrics and Gynecology

## 2019-12-06 DIAGNOSIS — Z1231 Encounter for screening mammogram for malignant neoplasm of breast: Secondary | ICD-10-CM | POA: Diagnosis not present

## 2019-12-15 ENCOUNTER — Other Ambulatory Visit: Payer: Self-pay

## 2019-12-15 ENCOUNTER — Ambulatory Visit (INDEPENDENT_AMBULATORY_CARE_PROVIDER_SITE_OTHER): Payer: Medicare PPO | Admitting: Neurology

## 2019-12-15 ENCOUNTER — Ambulatory Visit: Payer: Medicare PPO | Admitting: Neurology

## 2019-12-15 DIAGNOSIS — M5416 Radiculopathy, lumbar region: Secondary | ICD-10-CM

## 2019-12-15 DIAGNOSIS — Z0289 Encounter for other administrative examinations: Secondary | ICD-10-CM

## 2019-12-15 DIAGNOSIS — R202 Paresthesia of skin: Secondary | ICD-10-CM

## 2019-12-17 NOTE — Progress Notes (Signed)
Full Name: Lauren Garrett Gender: Female MRN #: [id number] Date of Birth: [birth year]    Visit Date: 12/15/2019 10:29 Age: 74 Years Examining Physician: Sarina Ill, MD Referring Physician: Antony Contras, MD Height: 5 feet 7 inch  History: Patient with low back pain and radiculopathy  Summary: EMG/NCS was performed on the bilateral lower extremities.  The left peroneal motor nerve showed decreased conduction velocity (fib head to ankle, 38 m/s, normal greater than 44) and decreased conduction velocity (pop fossa to fib head, 40 m/s, normal greater than 44).  The right peroneal motor nerve showed decreased conduction velocity (fib head to ankle, 40 m/s, normal greater than 44) and decreased conduction velocity (pop fossa to fib head, 39 m/s, normal greater than 44).  The left tibial motor nerve showed reduced amplitude (2.7 mV, normal greater than 4).  The left tibial F wave showed delayed latency (65.9 ms, normal less than 56) and the right tibial F wave showed delayed latency (61.4 ms, normal less than 56).  All remaining nerves (as indicated in the following tables) were within normal limits.  The left tibialis anterior showed polyphasic motor units and diminished motor unit recruitment.  The right tibialis anterior showed diminished motor unit recruitment.  The left gastrocnemius showed polyphasic motor units and diminished motor unit recruitment.  The right extensor hallucis longus muscle showed polyphasic motor units and diminished motor unit recruitment.  The left lumbar paraspinals showed increased spontaneous activity.     Conclusion: There is electrophysiologic evidence for acute on chronic left L5 radiculopathy and chronic right L5 radiculopathy.  ------------------------------- Drucie Ip, M.D.  [facility] [address] [place], [place] [address] Tel: [phone] Fax: [phone]  Verbal informed consent was obtained from the patient,  patient was informed of potential risk of procedure, including bruising, bleeding, hematoma formation, infection, muscle weakness, muscle pain, numbness, among others.        Hunt    Nerve / Sites Muscle Latency Ref. Amplitude Ref. Rel Amp Segments Distance Velocity Ref. Area    ms ms mV mV %  cm m/s m/s mVms  L Peroneal - EDB     Ankle EDB 4.6 ?6.5 4.9 ?2.0 100 Ankle - EDB 9   15.3     Fib head EDB 12.2  4.2  85.5 Fib head - Ankle 29 38 ?44 17.8     Pop fossa EDB 14.7  3.9  93.4 Pop fossa - Fib head 10 40 ?44 13.8         Pop fossa - Ankle      R Peroneal - EDB     Ankle EDB 5.7 ?6.5 2.0 ?2.0 100 Ankle - EDB 9   5.6     Fib head EDB 13.0  1.7  84.3 Fib head - Ankle 29 40 ?44 5.7     Pop fossa EDB 15.5  1.7  98.6 Pop fossa - Fib head 10 39 ?44 5.6         Pop fossa - Ankle      L Tibial - AH     Ankle AH 4.7 ?5.8 2.7 ?4.0 100 Ankle - AH 9   7.3     Pop fossa AH 14.3  1.8  66.2 Pop fossa - Ankle 40 42 ?41 7.2  R Tibial - AH     Ankle AH 5.8 ?5.8 7.0 ?4.0 100 Ankle - AH 9   14.4     Pop fossa AH 15.8  4.0  57.8 Pop fossa - Ankle 41 41 ?41 13.6             SNC    Nerve / Sites Rec. Site Peak Lat Ref.  Amp Ref. Segments Distance    ms ms V V  cm  L Sural - Ankle (Calf)     Calf Ankle 4.0 ?4.4 8 ?6 Calf - Ankle 14  R Sural - Ankle (Calf)     Calf Ankle 4.4 ?4.4 8 ?6 Calf - Ankle 14  L Superficial peroneal - Ankle     Lat leg Ankle 4.2 ?4.4 7 ?6 Lat leg - Ankle 14  R Superficial peroneal - Ankle     Lat leg Ankle 4.0 ?4.4 9 ?6 Lat leg - Ankle 14             F  Wave    Nerve F Lat Ref.   ms ms  L Tibial - AH 65.9 ?56.0  R Tibial - AH 61.4 ?56.0         EMG Summary Table    Spontaneous MUAP Recruitment  Muscle IA Fib PSW Fasc Other Amp Dur. Poly Pattern  L. Vastus medialis Normal None None None _______ Normal Normal Normal Normal  R. Vastus medialis Normal None None None _______ Normal Normal Normal Normal  L. Tibialis anterior Normal None None None _______ Increased Normal  2+ Reduced  R. Tibialis anterior Normal None None None _______ Normal Normal Normal Reduced  L. Gastrocnemius (Medial head) Normal None None None _______ Increased Normal 2+ Reduced  R. Gastrocnemius (Medial head) Normal None None None _______ Normal Normal Normal Normal  L. Extensor hallucis longus Normal None None None _______ Normal Normal Normal Normal  R. Extensor hallucis longus Normal None None None _______ Increased Increased 2+ Reduced  L. Biceps femoris (long head) Normal None None None _______ Normal Normal Normal Normal  R. Biceps femoris (long head) Normal None None None _______ Normal Normal Normal Normal  L. Gluteus maximus Normal None None None _______ Normal Normal Normal Normal  R. Gluteus maximus Normal None None None _______ Normal Normal Normal Normal  L. Gluteus medius Normal None None None _______ Normal Normal Normal Normal  R. Gluteus medius Normal None None None _______ Normal Normal Normal Normal  L. Lumbar paraspinals (low) Normal None 2+ None _______ Normal Normal Normal Normal  R. Lumbar paraspinals (low) Normal None None None _______ Normal Normal Normal Normal

## 2019-12-17 NOTE — Progress Notes (Signed)
See procedure note.

## 2019-12-17 NOTE — Procedures (Signed)
Full Name: Lauren Garrett Gender: Female MRN #: [id number] Date of Birth: 04-14-1946    Visit Date: 12/15/2019 10:29 Age: 74 Years Examining Physician: Sarina Ill, MD Referring Physician: Antony Contras, MD Height: 5 feet 7 inch  History: Patient with low back pain and radiculopathy  Summary: EMG/NCS was performed on the bilateral lower extremities.  The left peroneal motor nerve showed decreased conduction velocity (fib head to ankle, 38 m/s, normal greater than 44) and decreased conduction velocity (pop fossa to fib head, 40 m/s, normal greater than 44).  The right peroneal motor nerve showed decreased conduction velocity (fib head to ankle, 40 m/s, normal greater than 44) and decreased conduction velocity (pop fossa to fib head, 39 m/s, normal greater than 44).  The left tibial motor nerve showed reduced amplitude (2.7 mV, normal greater than 4).  The left tibial F wave showed delayed latency (65.9 ms, normal less than 56) and the right tibial F wave showed delayed latency (61.4 ms, normal less than 56).  All remaining nerves (as indicated in the following tables) were within normal limits.  The left tibialis anterior showed polyphasic motor units and diminished motor unit recruitment.  The right tibialis anterior showed diminished motor unit recruitment.  The left gastrocnemius showed polyphasic motor units and diminished motor unit recruitment.  The right extensor hallucis longus muscle showed polyphasic motor units and diminished motor unit recruitment.  The left lumbar paraspinals showed increased spontaneous activity.     Conclusion: There is electrophysiologic evidence for acute on chronic left L5 radiculopathy and chronic right L5 radiculopathy.  ------------------------------- Sarina Ill, M.D.  [facility] [address], [place], [place] [address] Tel: [phone] Fax: [phone]  Verbal informed consent was obtained from the patient,  patient was informed of potential risk of procedure, including bruising, bleeding, hematoma formation, infection, muscle weakness, muscle pain, numbness, among others.        Lauren Garrett    Nerve / Sites Muscle Latency Ref. Amplitude Ref. Rel Amp Segments Distance Velocity Ref. Area    ms ms mV mV %  cm m/s m/s mVms  L Peroneal - EDB     Ankle EDB 4.6 ?6.5 4.9 ?2.0 100 Ankle - EDB 9   15.3     Fib head EDB 12.2  4.2  85.5 Fib head - Ankle 29 38 ?44 17.8     Pop fossa EDB 14.7  3.9  93.4 Pop fossa - Fib head 10 40 ?44 13.8         Pop fossa - Ankle      R Peroneal - EDB     Ankle EDB 5.7 ?6.5 2.0 ?2.0 100 Ankle - EDB 9   5.6     Fib head EDB 13.0  1.7  84.3 Fib head - Ankle 29 40 ?44 5.7     Pop fossa EDB 15.5  1.7  98.6 Pop fossa - Fib head 10 39 ?44 5.6         Pop fossa - Ankle      L Tibial - AH     Ankle AH 4.7 ?5.8 2.7 ?4.0 100 Ankle - AH 9   7.3     Pop fossa AH 14.3  1.8  66.2 Pop fossa - Ankle 40 42 ?41 7.2  R Tibial - AH     Ankle AH 5.8 ?5.8 7.0 ?4.0 100 Ankle - AH 9   14.4     Pop fossa AH 15.8  4.0  57.8 Pop fossa - Ankle 41 41 ?41 13.6             SNC    Nerve / Sites Rec. Site Peak Lat Ref.  Amp Ref. Segments Distance    ms ms V V  cm  L Sural - Ankle (Calf)     Calf Ankle 4.0 ?4.4 8 ?6 Calf - Ankle 14  R Sural - Ankle (Calf)     Calf Ankle 4.4 ?4.4 8 ?6 Calf - Ankle 14  L Superficial peroneal - Ankle     Lat leg Ankle 4.2 ?4.4 7 ?6 Lat leg - Ankle 14  R Superficial peroneal - Ankle     Lat leg Ankle 4.0 ?4.4 9 ?6 Lat leg - Ankle 14             F  Wave    Nerve F Lat Ref.   ms ms  L Tibial - AH 65.9 ?56.0  R Tibial - AH 61.4 ?56.0         EMG Summary Table    Spontaneous MUAP Recruitment  Muscle IA Fib PSW Fasc Other Amp Dur. Poly Pattern  L. Vastus medialis Normal None None None _______ Normal Normal Normal Normal  R. Vastus medialis Normal None None None _______ Normal Normal Normal Normal  L. Tibialis anterior Normal None None None _______ Increased Normal  2+ Reduced  R. Tibialis anterior Normal None None None _______ Normal Normal Normal Reduced  L. Gastrocnemius (Medial head) Normal None None None _______ Increased Normal 2+ Reduced  R. Gastrocnemius (Medial head) Normal None None None _______ Normal Normal Normal Normal  L. Extensor hallucis longus Normal None None None _______ Normal Normal Normal Normal  R. Extensor hallucis longus Normal None None None _______ Increased Increased 2+ Reduced  L. Biceps femoris (long head) Normal None None None _______ Normal Normal Normal Normal  R. Biceps femoris (long head) Normal None None None _______ Normal Normal Normal Normal  L. Gluteus maximus Normal None None None _______ Normal Normal Normal Normal  R. Gluteus maximus Normal None None None _______ Normal Normal Normal Normal  L. Gluteus medius Normal None None None _______ Normal Normal Normal Normal  R. Gluteus medius Normal None None None _______ Normal Normal Normal Normal  L. Lumbar paraspinals (low) Normal None 2+ None _______ Normal Normal Normal Normal  R. Lumbar paraspinals (low) Normal None None None _______ Normal Normal Normal Normal

## 2019-12-22 ENCOUNTER — Encounter: Payer: Self-pay | Admitting: *Deleted

## 2019-12-22 NOTE — Progress Notes (Signed)
Kindly inform the patient that EMG nerve conduction study does confirm that she has pinched nerve in the low back on both sides which is not an unexpected or worrisome finding given her prior history

## 2020-01-17 ENCOUNTER — Ambulatory Visit (INDEPENDENT_AMBULATORY_CARE_PROVIDER_SITE_OTHER): Payer: Medicare PPO | Admitting: Orthopaedic Surgery

## 2020-01-17 ENCOUNTER — Encounter: Payer: Self-pay | Admitting: Orthopaedic Surgery

## 2020-01-17 ENCOUNTER — Other Ambulatory Visit: Payer: Self-pay

## 2020-01-17 DIAGNOSIS — M25572 Pain in left ankle and joints of left foot: Secondary | ICD-10-CM

## 2020-01-17 NOTE — Progress Notes (Signed)
Office Visit Note   Patient: Lauren Garrett           Date of Birth: 1946/04/05           MRN: [id number] Visit Date: 01/17/2020              Requested by: Haywood Pao, MD [address] [place],  [place] [address] PCP: Haywood Pao, MD   Assessment & Plan: Visit Diagnoses:  1. Pain in left ankle and joints of left foot     Plan: Lauren Garrett came to the office on a semiemergent basis walking in at the end of office hours for evaluation of bilateral foot pain, numbness and tingling with a concern that she "might lose my feet".  I have seen her on multiple occasions in the past for Workmen's Comp. injuries.  She has evidence of degenerative changes of the lumbar spine and has been followed by the neurosurgical service.  She is also had issues with her cervical spine also followed by the neurosurgeons.  She has evidence of arthritis in both of her knees and on occasion feels like her knees will give way.  She uses a cane.  Today she was mostly concerned about her feet with the burning and tingling and a concern that she may have poor circulation.  Her exam was essentially benign as she had excellent pulses and good capillary refill.  I assured her that I thought there was no concern with the integrity of her feet.  She also has been followed by Lauren Garrett with evidence of chronic lumbar radiculopathy and some evidence of peripheral neuropathy.  He actually tried a course of Topamax. I think it is worthwhile for her to be involved in a physical therapy program for strengthening of her lower extremities.  She can wear a knee support to prevent her knee giving way and, of course, continue with a cane.  Should follow up with Lauren Garrett in the neurosurgeons as needed.  I do not see any urgent orthopedic issues with her feet or her knees but I am always happy to see her in follow-up.  She will call back tomorrow with the name of the therapy facility to which she like to be  referred  Follow-Up Instructions: Return if symptoms worsen or fail to improve.   Orders:  No orders of the defined types were placed in this encounter.  No orders of the defined types were placed in this encounter.     Procedures: No procedures performed   Clinical Data: No additional findings.   Subjective: Chief Complaint  Patient presents with   Right Foot - Pain   Left Foot - Pain  Patient presents today for bilateral foot pain. She said that they have been hurting for two months. No known injury. The left foot is worse than the right. She states that she went to [facility] ED on 10/19/2019 for her feet. She was referred to LaurenSethi in neurology. She then states that she was told to see LaurenKenisha Garrett. She states that both feet are discolored and numb. She has pain in her feet because it feels like something is squeezing her feet. She takes Diclofenac and Hydrocodone for pain.   Has been followed by Lauren Garrett with recent EMGs and nerve conduction studies consistent with a chronic left and right L5 radiculopathy.  He plans to see her back in follow-up in 1 month.  He did try Topamax for her pain  HPI  Review of  Systems   Objective: Vital Signs: Ht 5\' 7"  (1.702 m)    Wt 152 lb (68.9 kg)    BMI 23.81 kg/m   Physical Exam Constitutional:      Appearance: She is well-developed.  Eyes:     Pupils: Pupils are equal, round, and reactive to light.  Pulmonary:     Effort: Pulmonary effort is normal.  Skin:    General: Skin is warm and dry.  Neurological:     Mental Status: She is alert and oriented to Garrett, place, and time.  Psychiatric:        Behavior: Behavior normal.     Ortho Exam both feet were warm with good capillary refill to the toes.  Nail polish on the great toes that appeared to have some chronic fungal changes but without pain.  No deformities.  No edema.  Motor exam intact.  Does relate having some tingling and mild numbness in a nondermatomal pattern on the  dorsal  and plantar aspects of both feet.  Right leg raise negative.  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  Specialty Comments:  No specialty comments available.  Imaging: No results found.   PMFS History: Patient Active Problem List   Diagnosis Date Noted   Pain in left ankle and joints of left foot 01/17/2020   Elevated blood pressure reading in office without diagnosis of hypertension 06/09/2017   Lipoma 12/28/2015   Dizziness and giddiness 09/27/2015   Tension headache 03/23/2013   Disturbance of skin sensation 02/02/2013   H/O cervical spine surgery 02/02/2013   Degeneration of lumbar or lumbosacral intervertebral disc 02/02/2013   Generalized anxiety disorder 02/02/2013   Hypercholesteremia    Past Medical History:  Diagnosis Date   Arthritis    Chronic pain    Headache    Hypercholesteremia    Hypertension    Spinal stenosis    Thyroid disease     Family History  Problem Relation Age of Onset   Dementia Mother    Lung cancer Father    Migraines Daughter    Breast cancer Neg Hx     Past Surgical History:  Procedure Laterality Date   NECK SURGERY     [phone]   Social History   Occupational History   Occupation: Rtired     Fish farm manager: RETIRED  Tobacco Use   Smoking status: Never Smoker   Smokeless tobacco: Never Used  Substance and Sexual Activity   Alcohol use: No   Drug use: No   Sexual activity: Not on file

## 2020-01-24 ENCOUNTER — Ambulatory Visit: Payer: Medicare PPO | Attending: Internal Medicine

## 2020-01-24 DIAGNOSIS — Z23 Encounter for immunization: Secondary | ICD-10-CM

## 2020-01-24 NOTE — Progress Notes (Signed)
   Covid-19 Vaccination Clinic  Name:  Lauren Garrett    MRN: [id number] DOB: Apr 12, 1946  01/24/2020  [name] was observed post Covid-19 immunization for 15 minutes without incident. She was provided with Vaccine Information Sheet and instruction to access the V-Safe system.   [name] was instructed to call 911 with any severe reactions post vaccine: Marland Kitchen Difficulty breathing  . Swelling of face and throat  . A fast heartbeat  . A bad rash all over body  . Dizziness and weakness

## 2020-01-26 DIAGNOSIS — M79672 Pain in left foot: Secondary | ICD-10-CM | POA: Diagnosis not present

## 2020-01-26 DIAGNOSIS — M79671 Pain in right foot: Secondary | ICD-10-CM | POA: Diagnosis not present

## 2020-01-26 DIAGNOSIS — Q6671 Congenital pes cavus, right foot: Secondary | ICD-10-CM | POA: Diagnosis not present

## 2020-01-26 DIAGNOSIS — M19071 Primary osteoarthritis, right ankle and foot: Secondary | ICD-10-CM | POA: Diagnosis not present

## 2020-01-26 DIAGNOSIS — M19072 Primary osteoarthritis, left ankle and foot: Secondary | ICD-10-CM | POA: Diagnosis not present

## 2020-01-26 DIAGNOSIS — M76829 Posterior tibial tendinitis, unspecified leg: Secondary | ICD-10-CM | POA: Diagnosis not present

## 2020-01-26 DIAGNOSIS — M205X2 Other deformities of toe(s) (acquired), left foot: Secondary | ICD-10-CM | POA: Diagnosis not present

## 2020-01-26 DIAGNOSIS — M76822 Posterior tibial tendinitis, left leg: Secondary | ICD-10-CM | POA: Diagnosis not present

## 2020-01-26 DIAGNOSIS — M25562 Pain in left knee: Secondary | ICD-10-CM | POA: Diagnosis not present

## 2020-01-26 DIAGNOSIS — G8929 Other chronic pain: Secondary | ICD-10-CM | POA: Diagnosis not present

## 2020-01-26 DIAGNOSIS — M25561 Pain in right knee: Secondary | ICD-10-CM | POA: Diagnosis not present

## 2020-01-26 DIAGNOSIS — M205X1 Other deformities of toe(s) (acquired), right foot: Secondary | ICD-10-CM | POA: Diagnosis not present

## 2020-01-26 DIAGNOSIS — M2142 Flat foot [pes planus] (acquired), left foot: Secondary | ICD-10-CM | POA: Diagnosis not present

## 2020-01-26 DIAGNOSIS — M7732 Calcaneal spur, left foot: Secondary | ICD-10-CM | POA: Diagnosis not present

## 2020-01-26 DIAGNOSIS — M7731 Calcaneal spur, right foot: Secondary | ICD-10-CM | POA: Diagnosis not present

## 2020-01-26 DIAGNOSIS — M2141 Flat foot [pes planus] (acquired), right foot: Secondary | ICD-10-CM | POA: Diagnosis not present

## 2020-01-30 DIAGNOSIS — M76821 Posterior tibial tendinitis, right leg: Secondary | ICD-10-CM | POA: Diagnosis not present

## 2020-01-30 DIAGNOSIS — G8929 Other chronic pain: Secondary | ICD-10-CM | POA: Diagnosis not present

## 2020-01-30 DIAGNOSIS — M170 Bilateral primary osteoarthritis of knee: Secondary | ICD-10-CM | POA: Diagnosis not present

## 2020-01-30 DIAGNOSIS — M25561 Pain in right knee: Secondary | ICD-10-CM | POA: Diagnosis not present

## 2020-01-30 DIAGNOSIS — M25562 Pain in left knee: Secondary | ICD-10-CM | POA: Diagnosis not present

## 2020-02-02 ENCOUNTER — Telehealth: Payer: Self-pay

## 2020-02-02 NOTE — Telephone Encounter (Signed)
Called and spoke with patient. She was inquiring about her physical therapy. She still does not know where she wants to have this done. She will call us tomorrow with a location so that I can send this referral in for her.

## 2020-02-03 DIAGNOSIS — M79672 Pain in left foot: Secondary | ICD-10-CM | POA: Diagnosis not present

## 2020-02-03 DIAGNOSIS — M79671 Pain in right foot: Secondary | ICD-10-CM | POA: Diagnosis not present

## 2020-02-08 DIAGNOSIS — Z23 Encounter for immunization: Secondary | ICD-10-CM | POA: Diagnosis not present

## 2020-02-08 DIAGNOSIS — M79672 Pain in left foot: Secondary | ICD-10-CM | POA: Diagnosis not present

## 2020-02-08 DIAGNOSIS — M79671 Pain in right foot: Secondary | ICD-10-CM | POA: Diagnosis not present

## 2020-02-08 DIAGNOSIS — M2141 Flat foot [pes planus] (acquired), right foot: Secondary | ICD-10-CM | POA: Diagnosis not present

## 2020-02-08 DIAGNOSIS — M25562 Pain in left knee: Secondary | ICD-10-CM | POA: Diagnosis not present

## 2020-02-08 DIAGNOSIS — M6281 Muscle weakness (generalized): Secondary | ICD-10-CM | POA: Diagnosis not present

## 2020-02-08 DIAGNOSIS — M76829 Posterior tibial tendinitis, unspecified leg: Secondary | ICD-10-CM | POA: Diagnosis not present

## 2020-02-08 DIAGNOSIS — Z7409 Other reduced mobility: Secondary | ICD-10-CM | POA: Diagnosis not present

## 2020-02-08 DIAGNOSIS — M25561 Pain in right knee: Secondary | ICD-10-CM | POA: Diagnosis not present

## 2020-02-08 DIAGNOSIS — R29898 Other symptoms and signs involving the musculoskeletal system: Secondary | ICD-10-CM | POA: Diagnosis not present

## 2020-02-20 ENCOUNTER — Encounter: Payer: Self-pay | Admitting: Neurology

## 2020-02-20 ENCOUNTER — Ambulatory Visit: Payer: Medicare PPO | Admitting: Neurology

## 2020-02-20 VITALS — BP 169/85 | HR 92 | Ht 69.0 in | Wt 144.4 lb

## 2020-02-20 DIAGNOSIS — M792 Neuralgia and neuritis, unspecified: Secondary | ICD-10-CM
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
## 2020-02-20 NOTE — Patient Instructions (Signed)
I had a long discussion with the patient and her husband regarding her neuropathic lower extremity pain discussed results of EMG nerve conduction studies and answered questions.  Continue Topamax and the current dose of 25 mg twice daily which she is tolerating with well and seems to have helped her.  Continue follow-up with Dr. Cyndy Freeze for help with Workmen's Comp. paperwork and follow-up with Dr. Shary Decamp for right knee pain and injections.  I recommend she continue outpatient physical therapy and ask for right foot AFO device to help her with her walking.  She will return for follow-up with me in the future in 6 months or call earlier if necessary.

## 2020-02-22 DIAGNOSIS — M25561 Pain in right knee: Secondary | ICD-10-CM | POA: Diagnosis not present

## 2020-02-22 DIAGNOSIS — M2141 Flat foot [pes planus] (acquired), right foot: Secondary | ICD-10-CM | POA: Diagnosis not present

## 2020-02-22 DIAGNOSIS — M76829 Posterior tibial tendinitis, unspecified leg: Secondary | ICD-10-CM | POA: Diagnosis not present

## 2020-02-22 DIAGNOSIS — M79671 Pain in right foot: Secondary | ICD-10-CM | POA: Diagnosis not present

## 2020-02-22 DIAGNOSIS — Z7409 Other reduced mobility: Secondary | ICD-10-CM | POA: Diagnosis not present

## 2020-02-22 DIAGNOSIS — M25562 Pain in left knee: Secondary | ICD-10-CM | POA: Diagnosis not present

## 2020-02-22 DIAGNOSIS — M79672 Pain in left foot: Secondary | ICD-10-CM | POA: Diagnosis not present

## 2020-02-22 DIAGNOSIS — M6281 Muscle weakness (generalized): Secondary | ICD-10-CM | POA: Diagnosis not present

## 2020-02-22 DIAGNOSIS — R29898 Other symptoms and signs involving the musculoskeletal system: Secondary | ICD-10-CM | POA: Diagnosis not present

## 2020-02-24 DIAGNOSIS — M79671 Pain in right foot: Secondary | ICD-10-CM | POA: Diagnosis not present

## 2020-02-24 DIAGNOSIS — M79672 Pain in left foot: Secondary | ICD-10-CM | POA: Diagnosis not present

## 2020-02-24 DIAGNOSIS — M25561 Pain in right knee: Secondary | ICD-10-CM | POA: Diagnosis not present

## 2020-02-24 DIAGNOSIS — M76829 Posterior tibial tendinitis, unspecified leg: Secondary | ICD-10-CM | POA: Diagnosis not present

## 2020-02-24 DIAGNOSIS — M2141 Flat foot [pes planus] (acquired), right foot: Secondary | ICD-10-CM | POA: Diagnosis not present

## 2020-02-24 DIAGNOSIS — Z7409 Other reduced mobility: Secondary | ICD-10-CM | POA: Diagnosis not present

## 2020-02-24 DIAGNOSIS — M6281 Muscle weakness (generalized): Secondary | ICD-10-CM | POA: Diagnosis not present

## 2020-02-24 DIAGNOSIS — R29898 Other symptoms and signs involving the musculoskeletal system: Secondary | ICD-10-CM | POA: Diagnosis not present

## 2020-02-24 DIAGNOSIS — M25562 Pain in left knee: Secondary | ICD-10-CM | POA: Diagnosis not present

## 2020-03-01 DIAGNOSIS — M79672 Pain in left foot: Secondary | ICD-10-CM | POA: Diagnosis not present

## 2020-03-01 DIAGNOSIS — M79671 Pain in right foot: Secondary | ICD-10-CM | POA: Diagnosis not present

## 2020-03-01 DIAGNOSIS — M6281 Muscle weakness (generalized): Secondary | ICD-10-CM | POA: Diagnosis not present

## 2020-03-01 DIAGNOSIS — Z7409 Other reduced mobility: Secondary | ICD-10-CM | POA: Diagnosis not present

## 2020-03-01 DIAGNOSIS — M2141 Flat foot [pes planus] (acquired), right foot: Secondary | ICD-10-CM | POA: Diagnosis not present

## 2020-03-01 DIAGNOSIS — M25561 Pain in right knee: Secondary | ICD-10-CM | POA: Diagnosis not present

## 2020-03-01 DIAGNOSIS — M25562 Pain in left knee: Secondary | ICD-10-CM | POA: Diagnosis not present

## 2020-03-01 DIAGNOSIS — R29898 Other symptoms and signs involving the musculoskeletal system: Secondary | ICD-10-CM | POA: Diagnosis not present

## 2020-03-01 DIAGNOSIS — M76829 Posterior tibial tendinitis, unspecified leg: Secondary | ICD-10-CM | POA: Diagnosis not present

## 2020-03-05 DIAGNOSIS — H524 Presbyopia: Secondary | ICD-10-CM | POA: Diagnosis not present

## 2020-03-05 DIAGNOSIS — Z961 Presence of intraocular lens: Secondary | ICD-10-CM | POA: Diagnosis not present

## 2020-03-05 DIAGNOSIS — H40023 Open angle with borderline findings, high risk, bilateral: Secondary | ICD-10-CM | POA: Diagnosis not present

## 2020-03-06 DIAGNOSIS — H6123 Impacted cerumen, bilateral: Secondary | ICD-10-CM | POA: Diagnosis not present

## 2020-03-16 DIAGNOSIS — M79672 Pain in left foot: Secondary | ICD-10-CM | POA: Diagnosis not present

## 2020-03-16 DIAGNOSIS — M6281 Muscle weakness (generalized): Secondary | ICD-10-CM | POA: Diagnosis not present

## 2020-03-16 DIAGNOSIS — M2141 Flat foot [pes planus] (acquired), right foot: Secondary | ICD-10-CM | POA: Diagnosis not present

## 2020-03-16 DIAGNOSIS — M79671 Pain in right foot: Secondary | ICD-10-CM | POA: Diagnosis not present

## 2020-03-16 DIAGNOSIS — Z7409 Other reduced mobility: Secondary | ICD-10-CM | POA: Diagnosis not present

## 2020-03-16 DIAGNOSIS — M25562 Pain in left knee: Secondary | ICD-10-CM | POA: Diagnosis not present

## 2020-03-16 DIAGNOSIS — M25561 Pain in right knee: Secondary | ICD-10-CM | POA: Diagnosis not present

## 2020-03-16 DIAGNOSIS — R29898 Other symptoms and signs involving the musculoskeletal system: Secondary | ICD-10-CM | POA: Diagnosis not present

## 2020-03-16 DIAGNOSIS — M76829 Posterior tibial tendinitis, unspecified leg: Secondary | ICD-10-CM | POA: Diagnosis not present

## 2020-03-22 DIAGNOSIS — M79672 Pain in left foot: Secondary | ICD-10-CM | POA: Diagnosis not present

## 2020-03-22 DIAGNOSIS — Z7409 Other reduced mobility: Secondary | ICD-10-CM | POA: Diagnosis not present

## 2020-03-22 DIAGNOSIS — M2141 Flat foot [pes planus] (acquired), right foot: Secondary | ICD-10-CM | POA: Diagnosis not present

## 2020-03-22 DIAGNOSIS — M79671 Pain in right foot: Secondary | ICD-10-CM | POA: Diagnosis not present

## 2020-03-22 DIAGNOSIS — M76829 Posterior tibial tendinitis, unspecified leg: Secondary | ICD-10-CM | POA: Diagnosis not present

## 2020-03-22 DIAGNOSIS — R29898 Other symptoms and signs involving the musculoskeletal system: Secondary | ICD-10-CM | POA: Diagnosis not present

## 2020-03-22 DIAGNOSIS — M6281 Muscle weakness (generalized): Secondary | ICD-10-CM | POA: Diagnosis not present

## 2020-03-22 DIAGNOSIS — M25561 Pain in right knee: Secondary | ICD-10-CM | POA: Diagnosis not present

## 2020-03-22 DIAGNOSIS — M25562 Pain in left knee: Secondary | ICD-10-CM | POA: Diagnosis not present

## 2020-03-26 DIAGNOSIS — H6123 Impacted cerumen, bilateral: Secondary | ICD-10-CM | POA: Diagnosis not present

## 2020-03-26 DIAGNOSIS — J343 Hypertrophy of nasal turbinates: Secondary | ICD-10-CM | POA: Diagnosis not present

## 2020-04-04 DIAGNOSIS — H903 Sensorineural hearing loss, bilateral: Secondary | ICD-10-CM | POA: Diagnosis not present

## 2020-04-05 DIAGNOSIS — M2142 Flat foot [pes planus] (acquired), left foot: Secondary | ICD-10-CM | POA: Diagnosis not present

## 2020-04-05 DIAGNOSIS — R29898 Other symptoms and signs involving the musculoskeletal system: Secondary | ICD-10-CM | POA: Diagnosis not present

## 2020-04-05 DIAGNOSIS — M25562 Pain in left knee: Secondary | ICD-10-CM | POA: Diagnosis not present

## 2020-04-05 DIAGNOSIS — M6281 Muscle weakness (generalized): Secondary | ICD-10-CM | POA: Diagnosis not present

## 2020-04-05 DIAGNOSIS — M76829 Posterior tibial tendinitis, unspecified leg: Secondary | ICD-10-CM | POA: Diagnosis not present

## 2020-04-05 DIAGNOSIS — M79671 Pain in right foot: Secondary | ICD-10-CM | POA: Diagnosis not present

## 2020-04-05 DIAGNOSIS — M25561 Pain in right knee: Secondary | ICD-10-CM | POA: Diagnosis not present

## 2020-04-05 DIAGNOSIS — M2141 Flat foot [pes planus] (acquired), right foot: Secondary | ICD-10-CM | POA: Diagnosis not present

## 2020-04-05 DIAGNOSIS — Z7409 Other reduced mobility: Secondary | ICD-10-CM | POA: Diagnosis not present

## 2020-05-31 ENCOUNTER — Ambulatory Visit (INDEPENDENT_AMBULATORY_CARE_PROVIDER_SITE_OTHER): Payer: Medicare PPO

## 2020-05-31 ENCOUNTER — Ambulatory Visit: Payer: Self-pay

## 2020-05-31 ENCOUNTER — Encounter: Payer: Self-pay | Admitting: Orthopaedic Surgery

## 2020-05-31 ENCOUNTER — Other Ambulatory Visit: Payer: Self-pay

## 2020-05-31 ENCOUNTER — Ambulatory Visit (INDEPENDENT_AMBULATORY_CARE_PROVIDER_SITE_OTHER): Payer: Medicare PPO | Admitting: Orthopaedic Surgery

## 2020-05-31 VITALS — Ht 69.0 in | Wt 144.0 lb

## 2020-05-31 DIAGNOSIS — M25562 Pain in left knee: Secondary | ICD-10-CM | POA: Diagnosis not present

## 2020-05-31 DIAGNOSIS — G8929 Other chronic pain: Secondary | ICD-10-CM

## 2020-05-31 DIAGNOSIS — I679 Cerebrovascular disease, unspecified: Secondary | ICD-10-CM | POA: Diagnosis not present

## 2020-05-31 DIAGNOSIS — M25561 Pain in right knee: Secondary | ICD-10-CM

## 2020-05-31 DIAGNOSIS — D692 Other nonthrombocytopenic purpura: Secondary | ICD-10-CM | POA: Diagnosis not present

## 2020-05-31 DIAGNOSIS — M170 Bilateral primary osteoarthritis of knee: Secondary | ICD-10-CM | POA: Insufficient documentation

## 2020-05-31 DIAGNOSIS — E1129 Type 2 diabetes mellitus with other diabetic kidney complication: Secondary | ICD-10-CM | POA: Diagnosis not present

## 2020-05-31 DIAGNOSIS — E1149 Type 2 diabetes mellitus with other diabetic neurological complication: Secondary | ICD-10-CM | POA: Diagnosis not present

## 2020-05-31 DIAGNOSIS — E039 Hypothyroidism, unspecified: Secondary | ICD-10-CM | POA: Diagnosis not present

## 2020-05-31 DIAGNOSIS — N1831 Chronic kidney disease, stage 3a: Secondary | ICD-10-CM | POA: Diagnosis not present

## 2020-05-31 DIAGNOSIS — E7800 Pure hypercholesterolemia, unspecified: Secondary | ICD-10-CM | POA: Diagnosis not present

## 2020-05-31 DIAGNOSIS — I129 Hypertensive chronic kidney disease with stage 1 through stage 4 chronic kidney disease, or unspecified chronic kidney disease: Secondary | ICD-10-CM | POA: Diagnosis not present

## 2020-05-31 DIAGNOSIS — G629 Polyneuropathy, unspecified: Secondary | ICD-10-CM | POA: Diagnosis not present

## 2020-05-31 NOTE — Progress Notes (Signed)
Office Visit Note   Patient: Lauren Garrett           Date of Birth: 1946/01/08           MRN: [id number] Visit Date: 05/31/2020              Requested by: Haywood Pao, MD [address] [place],  [place] [address] PCP: Haywood Pao, MD   Assessment & Plan: Visit Diagnoses:  1. Chronic pain of both knees   2. Bilateral primary osteoarthritis of knee     Plan: Lauren Garrett has bilateral knee osteoarthritis predominantly in the left knee.  She has increased valgus with knock-knee deformities.  She has many other problems in reference to her neck and her back and even her feet and is being followed elsewhere for each of those issues.  She still is covered by [employer].  She is concerned about her mobility and her ability to remain functional and wanted to know if she could get a brace for her left knee as she has 1 for the right and also if she could have a lift chair to monitor the stairs.  She apparently is being considered for yet another back surgery.  I think it is fine to have the brace to the left knee and I will give her prescription for the lift chair.  She also had other issues and concerns about home health care and I told her that there would be hard for me to justify that based on just her knees she have to check with all her other physicians and Workmen's Comp. has tried over-the-counter medicines.  We did not discuss cortisone or viscosupplementation or surgery at this point.  I do not think those are needed at this time  Follow-Up Instructions: Return if symptoms worsen or fail to improve.   Orders:  Orders Placed This Encounter  Procedures  . XR KNEE 3 VIEW LEFT  . XR KNEE 3 VIEW RIGHT   No orders of the defined types were placed in this encounter.     Procedures: No procedures performed   Clinical Data: No additional findings.   Subjective: Chief Complaint  Patient presents with  . Left Knee - Pain  . Right Knee - Pain  Patient  presents today for bilateral knee pain. She said that her right knee hurts worse than the left. Her right knee turns inward. She is fearful that her left knee will give way. She takes Vicodin for pain.  Being followed by [employer]. for multiple issues including her neck and back, feet and her knees  HPI  Review of Systems   Objective: Vital Signs: Ht 5\' 9"  (1.753 m)   Wt 144 lb (65.3 kg)   BMI 21.27 kg/m   Physical Exam Constitutional:      Appearance: She is well-developed and well-nourished.  HENT:     Mouth/Throat:     Mouth: Oropharynx is clear and moist.  Eyes:     Extraocular Movements: EOM normal.     Pupils: Pupils are equal, round, and reactive to light.  Pulmonary:     Effort: Pulmonary effort is normal.  Skin:    General: Skin is warm and dry.  Neurological:     Mental Status: She is alert and oriented to person, place, and time.  Psychiatric:        Mood and Affect: Mood and affect normal.        Behavior: Behavior normal.  Ortho Exam awake alert and oriented x3.  Looks good she is lost some weight which I am sure would be helpful.  She does have bilateral knock knees with increased valgus more on the left than the right.  Her left knee is larger than the right based on hypertrophic changes with evidence of osteoarthritis in all 3 compartments but predominate laterally.  There was some mild lateral joint pain.  Some mild patella crepitation but full extension of flexed over 100 degrees without instability.  No popliteal pain.  Skin intact.  Painless range of motion both hips  Specialty Comments:  No specialty comments available.  Imaging: XR KNEE 3 VIEW LEFT  Result Date: 05/31/2020 Films of the left knee were obtained in 3 projections standing.  There is about 15 degrees of valgus with considerable degenerative change in all 3 compartments but particularly laterally with there is narrowing, subchondral sclerosis and peripheral osteophytes.  Lesser  changes are noted medially and in the patellar area.  No ectopic calcification or acute change  XR KNEE 3 VIEW RIGHT  Result Date: 05/31/2020 Films of the right knee were obtained in 3 projections standing and compared to the left knee.  There are some mild degenerative changes in all 3 compartments with about 10 degrees of valgus.  Spaces are still maintained.  No ectopic calcification or acute changes    PMFS History: Patient Active Problem List   Diagnosis Date Noted  . Bilateral primary osteoarthritis of knee 05/31/2020  . Pain in left ankle and joints of left foot 01/17/2020  . Elevated blood pressure reading in office without diagnosis of hypertension 06/09/2017  . 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 Dizziness and giddiness 09/27/2015  . Tension headache 03/23/2013  . Disturbance of skin sensation 02/02/2013  . H/O cervical spine surgery 02/02/2013  . Degeneration of lumbar or lumbosacral intervertebral disc 02/02/2013  . Generalized anxiety disorder 02/02/2013  . Hypercholesteremia    Past Medical History:  Diagnosis Date  . Arthritis   . Chronic pain   . Headache   . Hypercholesteremia   . Hypertension   . Spinal stenosis   . Thyroid disease     Family History  Problem Relation Age of Onset  . Dementia Mother   . Lung cancer Father   . Migraines Daughter   . Breast cancer Neg Hx     Past Surgical History:  Procedure Laterality Date  . NECK SURGERY     [id number]   Social History   Occupational History  . Occupation: Rtired     Fish farm manager: RETIRED  Tobacco Use  . Smoking status: Never Smoker  . Smokeless tobacco: Never Used  Substance and Sexual Activity  . Alcohol use: No  . Drug use: No  . Sexual activity: Not on file

## 2020-06-05 ENCOUNTER — Ambulatory Visit: Payer: Medicare PPO | Admitting: Orthopaedic Surgery

## 2020-08-01 ENCOUNTER — Telehealth: Payer: Self-pay | Admitting: Orthopaedic Surgery

## 2020-08-01 NOTE — Telephone Encounter (Signed)
Left medical records release form at front desk per patient request.  Patient said her husband Lauren Garrett may also pick up medical records release form.

## 2020-08-01 NOTE — Telephone Encounter (Signed)
Received medical records release form from patient  

## 2020-08-16 ENCOUNTER — Telehealth: Payer: Self-pay | Admitting: Orthopaedic Surgery

## 2020-08-16 NOTE — Telephone Encounter (Signed)
Patient called asked if she can can a Rx for another knee brace. Patient said the brand name of the knee brace is called DONJOY with DRY TEX. The number to contact patient is [phone]

## 2020-08-16 NOTE — Telephone Encounter (Signed)
Spoke with patient. Script was faxed to [facility] for patient.

## 2020-08-20 ENCOUNTER — Ambulatory Visit: Payer: Medicare PPO | Admitting: Neurology

## 2020-08-23 ENCOUNTER — Ambulatory Visit (INDEPENDENT_AMBULATORY_CARE_PROVIDER_SITE_OTHER): Payer: Medicare PPO | Admitting: Orthopaedic Surgery

## 2020-08-23 ENCOUNTER — Other Ambulatory Visit: Payer: Self-pay

## 2020-08-23 ENCOUNTER — Encounter: Payer: Self-pay | Admitting: Orthopaedic Surgery

## 2020-08-23 VITALS — Ht 69.0 in | Wt 144.0 lb

## 2020-08-23 DIAGNOSIS — M1712 Unilateral primary osteoarthritis, left knee: Secondary | ICD-10-CM

## 2020-08-23 DIAGNOSIS — M1711 Unilateral primary osteoarthritis, right knee: Secondary | ICD-10-CM

## 2020-08-23 DIAGNOSIS — M170 Bilateral primary osteoarthritis of knee: Secondary | ICD-10-CM

## 2020-08-23 MED ORDER — BUPIVACAINE HCL 0.25 % IJ SOLN
2.0000 mL | INTRAMUSCULAR | Status: AC | PRN
  Administered 2020-08-23: 2 mL via INTRA_ARTICULAR

## 2020-08-23 MED ORDER — LIDOCAINE HCL 1 % IJ SOLN
2.0000 mL | INTRAMUSCULAR | Status: AC | PRN
  Administered 2020-08-23: 2 mL

## 2020-08-23 MED ORDER — METHYLPREDNISOLONE ACETATE 40 MG/ML IJ SUSP
80.0000 mg | INTRAMUSCULAR | Status: AC | PRN
  Administered 2020-08-23: 80 mg via INTRA_ARTICULAR

## 2020-08-23 NOTE — Progress Notes (Signed)
Office Visit Note   Patient: Lauren Garrett           Date of Birth: 03-09-1946           MRN: [id number] Visit Date: 08/23/2020              Requested by: Haywood Pao, MD [address] [place],  [place] [address] PCP: Haywood Pao, MD   Assessment & Plan: Visit Diagnoses:  1. Bilateral primary osteoarthritis of knee     Plan: Mrs. Yager has primary osteoarthritis of both knees.  Most of the degeneration appears to be in the lateral compartment.  She does have valgus knees by prior films.  Long discussion today regarding her knees and different treatment options even including knee replacement.  She has multiple joint issues.  She is being considered for further back surgery by Dr. Christella Noa.  In terms of her knees I think cortisone injections would be worthwhile followed by viscosupplementation.  I will inject the right knee today and have her return in 2 weeks and inject the left knee and depending upon her results consider viscosupplementation  Follow-Up Instructions: Return in about 2 weeks (around 09/06/2020).   Orders:  Orders Placed This Encounter  Procedures  . Large Joint Inj: R knee   No orders of the defined types were placed in this encounter.     Procedures: Large Joint Inj: R knee on 08/23/2020 3:20 PM Indications: pain and diagnostic evaluation Details: 25 G 1.5 in needle, anterolateral approach  Arthrogram: No  Medications: 2 mL lidocaine 1 %; 80 mg methylPREDNISolone acetate 40 MG/ML; 2 mL bupivacaine 0.25 % Procedure, treatment alternatives, risks and benefits explained, specific risks discussed. Consent was given by the patient. Immediately prior to procedure a time out was called to verify the correct patient, procedure, equipment, support staff and site/side marked as required. Patient was prepped and draped in the usual sterile fashion.       Clinical Data: No additional findings.   Subjective: Chief Complaint  Patient presents  with  . Right Knee - Pain  . Left Knee - Pain  Patient presents today for bilateral chronic knee pain. She said that both hurt equally the same, but the left knee is weaker than the right. She takes hydrocodone and diclofenac for pain. She is wanting to get a stair rider. She wears braces on both knees.  She recently went to [facility] for new braces but felt they were too bulky.  I also gave her prescription for chairlift to help her get up and down the stairs and she is in the process of getting that approved  HPI  Review of Systems   Objective: Vital Signs: Ht 5\' 9"  (1.753 m)   Wt 144 lb (65.3 kg)   BMI 21.27 kg/m   Physical Exam Constitutional:      Appearance: She is well-developed.  Eyes:     Pupils: Pupils are equal, round, and reactive to light.  Pulmonary:     Effort: Pulmonary effort is normal.  Skin:    General: Skin is warm and dry.  Neurological:     Mental Status: She is alert and oriented to person, place, and time.  Psychiatric:        Behavior: Behavior normal.     Ortho Exam full extension both knees flexed over 100 degrees without instability.  With weightbearing there is increased valgus consistent with her prior films demonstrating tricompartmental degenerative changes with most of the narrowing in  the lateral compartment.  Do not think she had an effusion.  No popliteal pain.  No calf pain.  She has been wearing bilateral DonJoy braces.  Some patella crepitation but no pain with patella compression.  Straight leg raise negative.  Painless range of motion both hips Specialty Comments:  No specialty comments available.  Imaging: No results found.   PMFS History: Patient Active Problem List   Diagnosis Date Noted  . Bilateral primary osteoarthritis of knee 05/31/2020  . Pain in left ankle and joints of left foot 01/17/2020  . Elevated blood pressure reading in office without diagnosis of hypertension 06/09/2017  . 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 Dizziness and  giddiness 09/27/2015  . Tension headache 03/23/2013  . Disturbance of skin sensation 02/02/2013  . H/O cervical spine surgery 02/02/2013  . Degeneration of lumbar or lumbosacral intervertebral disc 02/02/2013  . Generalized anxiety disorder 02/02/2013  . Hypercholesteremia    Past Medical History:  Diagnosis Date  . Arthritis   . Chronic pain   . Headache   . Hypercholesteremia   . Hypertension   . Spinal stenosis   . Thyroid disease     Family History  Problem Relation Age of Onset  . Dementia Mother   . Lung cancer Father   . Migraines Daughter   . Breast cancer Neg Hx     Past Surgical History:  Procedure Laterality Date  . NECK SURGERY     [id number]   Social History   Occupational History  . Occupation: Rtired     Fish farm manager: RETIRED  Tobacco Use  . Smoking status: Never Smoker  . Smokeless tobacco: Never Used  Substance and Sexual Activity  . Alcohol use: No  . Drug use: No  . Sexual activity: Not on file

## 2020-09-06 ENCOUNTER — Encounter: Payer: Self-pay | Admitting: Orthopaedic Surgery

## 2020-09-06 ENCOUNTER — Other Ambulatory Visit: Payer: Self-pay

## 2020-09-06 ENCOUNTER — Ambulatory Visit: Payer: Medicare PPO | Admitting: Orthopaedic Surgery

## 2020-09-06 VITALS — Ht 69.0 in | Wt 144.0 lb

## 2020-09-06 DIAGNOSIS — M1711 Unilateral primary osteoarthritis, right knee: Secondary | ICD-10-CM

## 2020-09-06 DIAGNOSIS — M170 Bilateral primary osteoarthritis of knee: Secondary | ICD-10-CM

## 2020-09-06 DIAGNOSIS — M1712 Unilateral primary osteoarthritis, left knee: Secondary | ICD-10-CM | POA: Diagnosis not present

## 2020-09-06 MED ORDER — BUPIVACAINE HCL 0.25 % IJ SOLN
2.0000 mL | INTRAMUSCULAR | Status: AC | PRN
  Administered 2020-09-06: 2 mL via INTRA_ARTICULAR

## 2020-09-06 MED ORDER — LIDOCAINE HCL 1 % IJ SOLN
2.0000 mL | INTRAMUSCULAR | Status: AC | PRN
  Administered 2020-09-06: 2 mL

## 2020-09-06 NOTE — Progress Notes (Signed)
Office Visit Note   Patient: Lauren Garrett           Date of Birth: 07/31/1945           MRN: [id number] Visit Date: 09/06/2020              Requested by: Haywood Pao, MD [address] [place],  [place] [address] PCP: Haywood Pao, MD   Assessment & Plan: Visit Diagnoses:  1. Bilateral primary osteoarthritis of knee     Plan: Left knee injected with betamethasone today.  Did well with a cortisone injection in her right knee 2 weeks ago.  Has bilateral knee braces for any instability.  Has considerable degenerative changes but tickly in the lateral compartments of both knees.  At some point may be a candidate for knee replacement  Follow-Up Instructions: Return if symptoms worsen or fail to improve.   Orders:  Orders Placed This Encounter  Procedures  . Large Joint Inj: L knee   No orders of the defined types were placed in this encounter.     Procedures: Large Joint Inj: L knee on 09/06/2020 5:12 PM Indications: pain and diagnostic evaluation Details: 25 G 1.5 in needle, anterolateral approach  Arthrogram: No  Medications: 2 mL lidocaine 1 %; 2 mL bupivacaine 0.25 %  12 mg betamethasone injected into left knee along the lateral joint with Marcaine and Xylocaine without problem Procedure, treatment alternatives, risks and benefits explained, specific risks discussed. Consent was given by the patient. Patient was prepped and draped in the usual sterile fashion.       Clinical Data: No additional findings.   Subjective: Chief Complaint  Patient presents with  . Right Knee - Pain  . Left Knee - Pain  Patient presents today for a two week follow up on her knees. She received a cortisone injection in her right knee on 08/23/2020. She states that the injection did help, and does want to have her left knee injected today.  HPI  Review of Systems   Objective: Vital Signs: Ht 5\' 9"  (1.753 m)   Wt 144 lb (65.3 kg)   BMI 21.27 kg/m   Physical  Exam Constitutional:      Appearance: She is well-developed.  Eyes:     Pupils: Pupils are equal, round, and reactive to light.  Pulmonary:     Effort: Pulmonary effort is normal.  Skin:    General: Skin is warm and dry.  Neurological:     Mental Status: She is alert and oriented to person, place, and time.  Psychiatric:        Behavior: Behavior normal.     Ortho Exam full extension left knee.  The knee was not hot red or swollen.  Maybe a very minimal effusion.  Increased valgus with weightbearing consistent with the arthritis in the lateral compartment.  Flexed over 100 degrees.  No popliteal pain or mass.  No calf pain  Specialty Comments:  No specialty comments available.  Imaging: No results found.   PMFS History: Patient Active Problem List   Diagnosis Date Noted  . Bilateral primary osteoarthritis of knee 05/31/2020  . Pain in left ankle and joints of left foot 01/17/2020  . Elevated blood pressure reading in office without diagnosis of hypertension 06/09/2017  . 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 Dizziness and giddiness 09/27/2015  . Tension headache 03/23/2013  . Disturbance of skin sensation 02/02/2013  . H/O cervical spine surgery 02/02/2013  . Degeneration of lumbar or lumbosacral  intervertebral disc 02/02/2013  . Generalized anxiety disorder 02/02/2013  . Hypercholesteremia    Past Medical History:  Diagnosis Date  . Arthritis   . Chronic pain   . Headache   . Hypercholesteremia   . Hypertension   . Spinal stenosis   . Thyroid disease     Family History  Problem Relation Age of Onset  . Dementia Mother   . Lung cancer Father   . Migraines Daughter   . Breast cancer Neg Hx     Past Surgical History:  Procedure Laterality Date  . NECK SURGERY     [id number]   Social History   Occupational History  . Occupation: Rtired     Fish farm manager: RETIRED  Tobacco Use  . Smoking status: Never Smoker  . Smokeless tobacco: Never Used  Substance and Sexual Activity  .  Alcohol use: No  . Drug use: No  . Sexual activity: Not on file

## 2020-09-10 DIAGNOSIS — H40023 Open angle with borderline findings, high risk, bilateral: Secondary | ICD-10-CM | POA: Diagnosis not present

## 2020-10-10 DIAGNOSIS — R3915 Urgency of urination: Secondary | ICD-10-CM | POA: Diagnosis not present

## 2020-10-10 DIAGNOSIS — R350 Frequency of micturition: Secondary | ICD-10-CM | POA: Diagnosis not present

## 2020-10-24 ENCOUNTER — Telehealth: Payer: Self-pay | Admitting: Orthopaedic Surgery

## 2020-10-24 NOTE — Telephone Encounter (Signed)
Spoke with patient and let her know that it had been emailed to [email or web address] successfully.

## 2020-10-24 NOTE — Telephone Encounter (Signed)
Pt called asking for a copy of the rx Dr. Durward Fortes gave her for a stair lift. Pt states if this could be emailed that would be great but if she has to come in to pick it up that's fine as well. Pt also asked to have this sent her today if possible.   [email or web address]  [phone]

## 2020-10-24 NOTE — Telephone Encounter (Signed)
rewritten

## 2020-11-30 DIAGNOSIS — E039 Hypothyroidism, unspecified: Secondary | ICD-10-CM | POA: Diagnosis not present

## 2020-11-30 DIAGNOSIS — E1149 Type 2 diabetes mellitus with other diabetic neurological complication: Secondary | ICD-10-CM | POA: Diagnosis not present

## 2020-11-30 DIAGNOSIS — E7800 Pure hypercholesterolemia, unspecified: Secondary | ICD-10-CM | POA: Diagnosis not present

## 2020-12-06 DIAGNOSIS — E1129 Type 2 diabetes mellitus with other diabetic kidney complication: Secondary | ICD-10-CM | POA: Diagnosis not present

## 2020-12-06 DIAGNOSIS — Z1212 Encounter for screening for malignant neoplasm of rectum: Secondary | ICD-10-CM | POA: Diagnosis not present

## 2020-12-07 DIAGNOSIS — M4800 Spinal stenosis, site unspecified: Secondary | ICD-10-CM | POA: Diagnosis not present

## 2020-12-07 DIAGNOSIS — I129 Hypertensive chronic kidney disease with stage 1 through stage 4 chronic kidney disease, or unspecified chronic kidney disease: Secondary | ICD-10-CM | POA: Diagnosis not present

## 2020-12-07 DIAGNOSIS — E039 Hypothyroidism, unspecified: Secondary | ICD-10-CM | POA: Diagnosis not present

## 2020-12-07 DIAGNOSIS — N1831 Chronic kidney disease, stage 3a: Secondary | ICD-10-CM | POA: Diagnosis not present

## 2020-12-07 DIAGNOSIS — E1129 Type 2 diabetes mellitus with other diabetic kidney complication: Secondary | ICD-10-CM | POA: Diagnosis not present

## 2020-12-07 DIAGNOSIS — R82998 Other abnormal findings in urine: Secondary | ICD-10-CM | POA: Diagnosis not present

## 2020-12-07 DIAGNOSIS — D631 Anemia in chronic kidney disease: Secondary | ICD-10-CM | POA: Diagnosis not present

## 2020-12-07 DIAGNOSIS — R195 Other fecal abnormalities: Secondary | ICD-10-CM | POA: Diagnosis not present

## 2020-12-07 DIAGNOSIS — Z0000 Encounter for general adult medical examination without abnormal findings: Secondary | ICD-10-CM | POA: Diagnosis not present

## 2020-12-07 DIAGNOSIS — Z1389 Encounter for screening for other disorder: Secondary | ICD-10-CM | POA: Diagnosis not present

## 2020-12-07 DIAGNOSIS — D692 Other nonthrombocytopenic purpura: Secondary | ICD-10-CM | POA: Diagnosis not present

## 2020-12-07 DIAGNOSIS — Z1331 Encounter for screening for depression: Secondary | ICD-10-CM | POA: Diagnosis not present

## 2020-12-13 DIAGNOSIS — D649 Anemia, unspecified: Secondary | ICD-10-CM | POA: Diagnosis not present

## 2020-12-13 DIAGNOSIS — K921 Melena: Secondary | ICD-10-CM | POA: Diagnosis not present

## 2020-12-21 DIAGNOSIS — K5730 Diverticulosis of large intestine without perforation or abscess without bleeding: Secondary | ICD-10-CM | POA: Diagnosis not present

## 2020-12-21 DIAGNOSIS — D509 Iron deficiency anemia, unspecified: Secondary | ICD-10-CM | POA: Diagnosis not present

## 2020-12-21 DIAGNOSIS — R195 Other fecal abnormalities: Secondary | ICD-10-CM | POA: Diagnosis not present

## 2020-12-26 DIAGNOSIS — N3941 Urge incontinence: Secondary | ICD-10-CM | POA: Diagnosis not present

## 2021-01-10 ENCOUNTER — Ambulatory Visit: Payer: Medicare PPO | Admitting: Orthopaedic Surgery

## 2021-01-14 ENCOUNTER — Telehealth: Payer: Self-pay

## 2021-01-14 NOTE — Telephone Encounter (Signed)
Pt called in stating that she had a really bad fall due to her knees buckling. She states that she cant wait till the 11th for an apt she would like to know if she can get in sooner.

## 2021-01-15 NOTE — Telephone Encounter (Signed)
See if she can come in sometime next week

## 2021-01-15 NOTE — Telephone Encounter (Signed)
I tried to call and schedule pt for next week but her mailbox is full I will try again later

## 2021-01-24 ENCOUNTER — Encounter: Payer: Self-pay | Admitting: Orthopaedic Surgery

## 2021-01-24 ENCOUNTER — Other Ambulatory Visit: Payer: Self-pay

## 2021-01-24 ENCOUNTER — Ambulatory Visit (INDEPENDENT_AMBULATORY_CARE_PROVIDER_SITE_OTHER): Payer: Medicare PPO | Admitting: Orthopaedic Surgery

## 2021-01-24 DIAGNOSIS — M170 Bilateral primary osteoarthritis of knee: Secondary | ICD-10-CM | POA: Diagnosis not present

## 2021-01-24 MED ORDER — LIDOCAINE HCL 1 % IJ SOLN
2.0000 mL | INTRAMUSCULAR | Status: AC | PRN
  Administered 2021-01-24: 2 mL

## 2021-01-24 MED ORDER — METHYLPREDNISOLONE ACETATE 40 MG/ML IJ SUSP
80.0000 mg | INTRAMUSCULAR | Status: AC | PRN
  Administered 2021-01-24: 80 mg via INTRA_ARTICULAR

## 2021-01-24 MED ORDER — BUPIVACAINE HCL 0.25 % IJ SOLN
2.0000 mL | INTRAMUSCULAR | Status: AC | PRN
  Administered 2021-01-24: 2 mL via INTRA_ARTICULAR

## 2021-01-24 NOTE — Progress Notes (Signed)
Office Visit Note   Patient: Lauren Garrett           Date of Birth: [birth year]           MRN: [id number] Visit Date: 01/24/2021              Requested by: Haywood Pao, MD [address] [place],   [address] PCP: Haywood Pao, MD   Assessment & Plan: Visit Diagnoses:  1. Bilateral primary osteoarthritis of knee     Plan: Mrs. Carrillo is accompanied by her husband and here for evaluation of bilateral knee pain.  I seen her on multiple occasions and even obtain films of her knees earlier in the year.  She has nearly end-stage osteoarthritic changes predominantly in the lateral compartments associated with increased valgus.  She has had a number of braces over the years but she feels like she needs new ones because she is lost weight.  I will send her to [facility] with a prescription for them to evaluate her prior braces and consider new braces if they felt that was appropriate.  She wanted talk about knee replacement but I think she is a poor candidate at this point.  She if she is very deconditioned she has a chronic problem with her back and extremely weak quads.  I would like her to get involved in an exercise program to strengthen her quads and then revisit the issue of knee replacement surgery sometime around the first of the year.  She has been seeing Dr. Christella Noa for her back porch she also wanted a stairmaster to help her get up and down the stairs.  I fill out a form for that earlier in the year but she seems to have misplaced it so I have asked them to have [employer]'s [employer]. send the papers.  Time spent over 45 minutes  Follow-Up Instructions: Return in about 2 weeks (around 02/07/2021).   Orders:  No orders of the defined types were placed in this encounter.  No orders of the defined types were placed in this encounter.     Procedures: Large Joint Inj: L knee on 01/24/2021 4:37 PM Indications: pain and diagnostic evaluation Details: 25 G 1.5 in needle,  anterolateral approach  Arthrogram: No  Medications: 2 mL lidocaine 1 %; 80 mg methylPREDNISolone acetate 40 MG/ML; 2 mL bupivacaine 0.25 % Procedure, treatment alternatives, risks and benefits explained, specific risks discussed. Consent was given by the patient. Patient was prepped and draped in the usual sterile fashion.      Clinical Data: No additional findings.   Subjective: Chief Complaint  Patient presents with   Left Knee - Pain   Right Knee - Pain  Patient presents today for bilateral knee pain. She said that she continues to hurt and wants to have everything evaluated again.  HPI  Review of Systems   Objective: Vital Signs: There were no vitals taken for this visit.  Physical Exam Constitutional:      Appearance: She is well-developed.  Pulmonary:     Effort: Pulmonary effort is normal.  Skin:    General: Skin is warm and dry.  Neurological:     Mental Status: She is alert and oriented to person, place, and time.  Psychiatric:        Behavior: Behavior normal.    Ortho Exam awake and alert.  Braces on both knees.  Increased valgus with weightbearing.  There is lateral joint tenderness more in the left than the right  and maybe a small effusion in the left knee compared to the right.  The knees were not hot warm or red.  Full extension and flex to overall 100 degrees without instability.  I thought there was considerable atrophy of both of her quads.  Painless range of motion of both of her hips.  Motor exam intact  Specialty Comments:  No specialty comments available.  Imaging: No results found.   PMFS History: Patient Active Problem List   Diagnosis Date Noted   Bilateral primary osteoarthritis of knee 05/31/2020   Pain in left ankle and joints of left foot 01/17/2020   Elevated blood pressure reading in office without diagnosis of hypertension 06/09/2017   Lipoma 12/28/2015   Dizziness and giddiness 09/27/2015   Tension headache 03/23/2013    Disturbance of skin sensation 02/02/2013   H/O cervical spine surgery 02/02/2013   Degeneration of lumbar or lumbosacral intervertebral disc 02/02/2013   Generalized anxiety disorder 02/02/2013   Hypercholesteremia    Past Medical History:  Diagnosis Date   Arthritis    Chronic pain    Headache    Hypercholesteremia    Hypertension    Spinal stenosis    Thyroid disease     Family History  Problem Relation Age of Onset   Dementia Mother    Lung cancer Father    Migraines Daughter    Breast cancer Neg Hx     Past Surgical History:  Procedure Laterality Date   NECK SURGERY     [phone]   Social History   Occupational History   Occupation: Rtired     Fish farm manager: RETIRED  Tobacco Use   Smoking status: Never   Smokeless tobacco: Never  Substance and Sexual Activity   Alcohol use: No   Drug use: No   Sexual activity: Not on file

## 2021-02-07 ENCOUNTER — Encounter: Payer: Self-pay | Admitting: Orthopaedic Surgery

## 2021-02-07 ENCOUNTER — Other Ambulatory Visit: Payer: Self-pay

## 2021-02-07 ENCOUNTER — Ambulatory Visit (INDEPENDENT_AMBULATORY_CARE_PROVIDER_SITE_OTHER): Payer: Medicare PPO | Admitting: Orthopaedic Surgery

## 2021-02-07 ENCOUNTER — Ambulatory Visit: Payer: Self-pay

## 2021-02-07 DIAGNOSIS — M25552 Pain in left hip: Secondary | ICD-10-CM | POA: Diagnosis not present

## 2021-02-07 DIAGNOSIS — M170 Bilateral primary osteoarthritis of knee: Secondary | ICD-10-CM | POA: Diagnosis not present

## 2021-02-07 MED ORDER — LIDOCAINE HCL 1 % IJ SOLN
4.0000 mL | INTRAMUSCULAR | Status: AC | PRN
  Administered 2021-02-07: 4 mL

## 2021-02-07 MED ORDER — METHYLPREDNISOLONE ACETATE 40 MG/ML IJ SUSP
80.0000 mg | INTRAMUSCULAR | Status: AC | PRN
  Administered 2021-02-07: 80 mg via INTRA_ARTICULAR

## 2021-02-07 NOTE — Progress Notes (Signed)
Office Visit Note   Patient: Lauren Garrett           Date of Birth: 03/17/1946           MRN: [id number] Visit Date: 02/07/2021              Requested by: Haywood Pao, MD [address] [place],  [place] [address] PCP: Osborne Casco Fransico Him, MD   Assessment & Plan: Visit Diagnoses: No diagnosis found.  Plan: I had a long discussion with the patient today.  She is here for her right knee and did have good relief with her left knee injection.  She does say that 1 month ago she fell while getting her medication at the pharmacy.  Since then she has had pain in her posterior buttock over what she calls to sit bone and she says this radiates down her leg.  She also thinks it aggravated a previous ankle and foot injury.  We did go forward with the injection of her right knee.  I could not find any new acute injuries when comparing pelvis films from today to 2017.  I told her about the natural history of this is right at the insertion of the hamstring.  She should avoid going up and down stairs and aggravating factors.  Certainly get sitting on a soft surface may help.  She will follow-up if this does not get better in a month we could consider an MRI if it is truly a significant issue.  In the meantime she is having an MRI of her spine and planning for a back surgery.  New prescription was also provided for a stair lift  Follow-Up Instructions: No follow-ups on file.   Orders:  No orders of the defined types were placed in this encounter.  No orders of the defined types were placed in this encounter.     Procedures: Large Joint Inj: R knee on 02/07/2021 3:33 PM Indications: pain and diagnostic evaluation Details: 25 G 1.5 in needle, anterolateral approach  Arthrogram: No  Medications: 80 mg methylPREDNISolone acetate 40 MG/ML; 4 mL lidocaine 1 % Outcome: tolerated well, no immediate complications Procedure, treatment alternatives, risks and benefits explained, specific risks  discussed. Consent was given by the patient.      Clinical Data: No additional findings.   Subjective: Chief Complaint  Patient presents with   Right Knee - Pain   Left Knee - Pain  Patient presents today for follow up on both knees. She had her left knee injected two weeks ago. She is wanting to have the right knee injected today.  She does feel she got relief from the injection in her left knee.  She is also concerned as a month ago she had a fall in pharmacy where getting her medication she her legs folded beneath her.  She fell onto her knees.  Since then she has had pain and what she calls the left sit bone since had ongoing paresthesias in both her feet.  She denies any weakness.  She did see her neurosurgeon 2 days ago who did not appreciate any acute injuries in her back however she is anticipating back surgery and has an MRI ordered  HPI  Review of Systems  All other systems reviewed and are negative.   Objective: Vital Signs: There were no vitals taken for this visit.  Physical Exam Constitutional:      Appearance: Normal appearance.  Eyes:     Extraocular Movements: Extraocular movements intact.  Neurological:     Mental Status: She is alert.  Psychiatric:        Mood and Affect: Mood normal.        Behavior: Behavior normal.    Ortho Exam Examination of her right knee she has no effusion no rednessno cellulitis. Valgus alignment. Pain with passive range of motion tender over the lateral and medial compartments Left pelvis. Tender over the left ischial tuberosity. Mildly reproduced with hamstring stretching. Patient is uncomfortable siting in this area.  Distal paresthesias bilateral .  Strength 5/5 Specialty Comments:  No specialty comments available.  Imaging: No results found.   PMFS History: Patient Active Problem List   Diagnosis Date Noted   Bilateral primary osteoarthritis of knee 05/31/2020   Pain in left ankle and joints of left foot 01/17/2020    Elevated blood pressure reading in office without diagnosis of hypertension 06/09/2017   Lipoma 12/28/2015   Dizziness and giddiness 09/27/2015   Tension headache 03/23/2013   Disturbance of skin sensation 02/02/2013   H/O cervical spine surgery 02/02/2013   Degeneration of lumbar or lumbosacral intervertebral disc 02/02/2013   Generalized anxiety disorder 02/02/2013   Hypercholesteremia    Past Medical History:  Diagnosis Date   Arthritis    Chronic pain    Headache    Hypercholesteremia    Hypertension    Spinal stenosis    Thyroid disease     Family History  Problem Relation Age of Onset   Dementia Mother    Lung cancer Father    Migraines Daughter    Breast cancer Neg Hx     Past Surgical History:  Procedure Laterality Date   NECK SURGERY     [phone]   Social History   Occupational History   Occupation: Rtired     Fish farm manager: RETIRED  Tobacco Use   Smoking status: Never   Smokeless tobacco: Never  Substance and Sexual Activity   Alcohol use: No   Drug use: No   Sexual activity: Not on file

## 2021-04-10 ENCOUNTER — Encounter: Payer: Self-pay | Admitting: Orthopaedic Surgery

## 2021-04-10 ENCOUNTER — Ambulatory Visit (INDEPENDENT_AMBULATORY_CARE_PROVIDER_SITE_OTHER): Payer: Medicare PPO | Admitting: Orthopaedic Surgery

## 2021-04-10 ENCOUNTER — Other Ambulatory Visit: Payer: Self-pay

## 2021-04-10 DIAGNOSIS — M1712 Unilateral primary osteoarthritis, left knee: Secondary | ICD-10-CM | POA: Diagnosis not present

## 2021-04-10 DIAGNOSIS — M170 Bilateral primary osteoarthritis of knee: Secondary | ICD-10-CM

## 2021-04-10 MED ORDER — METHYLPREDNISOLONE ACETATE 40 MG/ML IJ SUSP
80.0000 mg | INTRAMUSCULAR | Status: AC | PRN
  Administered 2021-04-10: 14:00:00 80 mg via INTRA_ARTICULAR

## 2021-04-10 MED ORDER — LIDOCAINE HCL 1 % IJ SOLN
5.0000 mL | INTRAMUSCULAR | Status: AC | PRN
  Administered 2021-04-10: 14:00:00 5 mL

## 2021-04-10 NOTE — Progress Notes (Signed)
Office Visit Note   Patient: Lauren Garrett           Date of Birth: Dec 24, 1945           MRN: [id number] Visit Date: 04/10/2021              Requested by: Haywood Pao, MD [address] [place],  [place] [address] PCP: Osborne Casco Fransico Him, MD   Assessment & Plan: Visit Diagnoses: No diagnosis found.  Plan: Patient is a pleasant 75 year old woman with bilateral arthritis of her knees.  She had her right knee injected 2 months ago with some cortisone.  She is wondering if she could have her left knee injected today.  It has been just about 3 months since that knee was injected we went forward and injected the left knee.  She wanted to know when she could have her right knee injected and I told her that would be appropriate for mid January.  She does have back surgery coming up sometime in January or February I have asked that she touch base with her surgeon and make sure that is okay for [place] to give her injections prior to or just after the surgery  Follow-Up Instructions: No follow-ups on file.   Orders:  No orders of the defined types were placed in this encounter.  No orders of the defined types were placed in this encounter.     Procedures: Large Joint Inj: L knee on 04/10/2021 1:57 PM Indications: pain and diagnostic evaluation Details: 25 G 1.5 in needle, anteromedial approach  Arthrogram: No  Medications: 80 mg methylPREDNISolone acetate 40 MG/ML; 5 mL lidocaine 1 % Outcome: tolerated well, no immediate complications Procedure, treatment alternatives, risks and benefits explained, specific risks discussed. Consent was given by the patient.      Clinical Data: No additional findings.   Subjective: Chief Complaint  Patient presents with   Left Knee - Pain  Patient presents today for recurrent left knee pain. She is wanting to get a cortisone injection in her left knee today.    Review of Systems  All other systems reviewed and are  negative.   Objective: Vital Signs: There were no vitals taken for this visit.  Physical Exam Patient is alert appropriate to exam comfortable sitting in a chair Ortho Exam Examination of her left knee has mild soft tissue swelling but no effusion no erythema no cellulitis tenderness over the medial lateral joint line. Specialty Comments:  No specialty comments available.  Imaging: No results found.   PMFS History: Patient Active Problem List   Diagnosis Date Noted   Bilateral primary osteoarthritis of knee 05/31/2020   Pain in left ankle and joints of left foot 01/17/2020   Elevated blood pressure reading in office without diagnosis of hypertension 06/09/2017   Lipoma 12/28/2015   Dizziness and giddiness 09/27/2015   Tension headache 03/23/2013   Disturbance of skin sensation 02/02/2013   H/O cervical spine surgery 02/02/2013   Degeneration of lumbar or lumbosacral intervertebral disc 02/02/2013   Generalized anxiety disorder 02/02/2013   Hypercholesteremia    Past Medical History:  Diagnosis Date   Arthritis    Chronic pain    Headache    Hypercholesteremia    Hypertension    Spinal stenosis    Thyroid disease     Family History  Problem Relation Age of Onset   Dementia Mother    Lung cancer Father    Migraines Daughter    Breast cancer Neg Hx  Past Surgical History:  Procedure Laterality Date   NECK SURGERY     [phone]   Social History   Occupational History   Occupation: Rtired     Fish farm manager: RETIRED  Tobacco Use   Smoking status: Never   Smokeless tobacco: Never  Substance and Sexual Activity   Alcohol use: No   Drug use: No   Sexual activity: Not on file

## 2021-05-28 ENCOUNTER — Ambulatory Visit (INDEPENDENT_AMBULATORY_CARE_PROVIDER_SITE_OTHER): Payer: Medicare PPO | Admitting: Orthopaedic Surgery

## 2021-05-28 ENCOUNTER — Encounter: Payer: Self-pay | Admitting: Orthopaedic Surgery

## 2021-05-28 ENCOUNTER — Other Ambulatory Visit: Payer: Self-pay

## 2021-05-28 DIAGNOSIS — M1712 Unilateral primary osteoarthritis, left knee: Secondary | ICD-10-CM | POA: Diagnosis not present

## 2021-05-28 DIAGNOSIS — M170 Bilateral primary osteoarthritis of knee: Secondary | ICD-10-CM

## 2021-05-28 MED ORDER — METHYLPREDNISOLONE ACETATE 40 MG/ML IJ SUSP
80.0000 mg | INTRAMUSCULAR | Status: AC | PRN
  Administered 2021-05-28: 80 mg via INTRA_ARTICULAR

## 2021-05-28 NOTE — Progress Notes (Addendum)
Office Visit Note   Patient: Lauren Garrett           Date of Birth: 01-02-1946           MRN: [id number] Visit Date: 05/28/2021              Requested by: Haywood Pao, MD [address] [place],  [place] [address] PCP: Osborne Casco, Fransico Him, MD   Assessment & Plan: Visit Diagnoses: Left knee pain  Plan: Lauren Garrett is a pleasant 76 year old woman who has arthritis of both her knees.  She was given an injection into the left knee 7 to 8 weeks ago.  She is asking if she could have an injection into the left knee today.  I did go forward inject her left knee.  She would like to come back in 2 weeks to have her right knee injected.  I told her this was fine  Follow-Up Instructions: No follow-ups on file.   Orders:  No orders of the defined types were placed in this encounter.  No orders of the defined types were placed in this encounter.     Procedures: Large Joint Inj: L knee on 05/28/2021 1:37 PM Indications: pain and diagnostic evaluation Details: 25 G 1.5 in needle, anteromedial approach  Arthrogram: No  Medications: 80 mg methylPREDNISolone acetate 40 MG/ML Outcome: tolerated well, no immediate complications Procedure, treatment alternatives, risks and benefits explained, specific risks discussed. Consent was given by the patient.      Clinical Data: No additional findings.   Subjective: Chief Complaint  Patient presents with   Right Knee - Pain   Left Knee - Pain  Patient presents today for her chronic bilateral knee pain. She received a cortisone injection into her left knee 7 weeks ago, on 04/10/2021. She is wanting to get her left knee injected again. The right knee was last injected on 02/07/2021. She said that the left knee is worse than the right. She is also rubbing Voltaren Gel on her knee.     Review of Systems  All other systems reviewed and are negative.   Objective: Vital Signs: There were no vitals taken for this visit.  Physical  Exam Patient appears well  Ortho Exam Left knee no effusion no erythema pain over the lateral greater than medial joint line with slight valgus alignment minimal soft tissue swelling Specialty Comments:  No specialty comments available.  Imaging: No results found.   PMFS History: Patient Active Problem List   Diagnosis Date Noted   Bilateral primary osteoarthritis of knee 05/31/2020   Pain in left ankle and joints of left foot 01/17/2020   Elevated blood pressure reading in office without diagnosis of hypertension 06/09/2017   Lipoma 12/28/2015   Dizziness and giddiness 09/27/2015   Tension headache 03/23/2013   Disturbance of skin sensation 02/02/2013   H/O cervical spine surgery 02/02/2013   Degeneration of lumbar or lumbosacral intervertebral disc 02/02/2013   Generalized anxiety disorder 02/02/2013   Hypercholesteremia    Past Medical History:  Diagnosis Date   Arthritis    Chronic pain    Headache    Hypercholesteremia    Hypertension    Spinal stenosis    Thyroid disease     Family History  Problem Relation Age of Onset   Dementia Mother    Lung cancer Father    Migraines Daughter    Breast cancer Neg Hx     Past Surgical History:  Procedure Laterality Date   NECK SURGERY  [id number]   Social History   Occupational History   Occupation: Rtired     Fish farm manager: RETIRED  Tobacco Use   Smoking status: Never   Smokeless tobacco: Never  Substance and Sexual Activity   Alcohol use: No   Drug use: No   Sexual activity: Not on file

## 2021-06-06 DIAGNOSIS — H524 Presbyopia: Secondary | ICD-10-CM | POA: Diagnosis not present

## 2021-06-06 DIAGNOSIS — H43813 Vitreous degeneration, bilateral: Secondary | ICD-10-CM | POA: Diagnosis not present

## 2021-06-06 DIAGNOSIS — H35373 Puckering of macula, bilateral: Secondary | ICD-10-CM | POA: Diagnosis not present

## 2021-06-06 DIAGNOSIS — H26493 Other secondary cataract, bilateral: Secondary | ICD-10-CM | POA: Diagnosis not present

## 2021-06-06 DIAGNOSIS — H40023 Open angle with borderline findings, high risk, bilateral: Secondary | ICD-10-CM | POA: Diagnosis not present

## 2021-06-18 ENCOUNTER — Other Ambulatory Visit: Payer: Self-pay

## 2021-06-18 ENCOUNTER — Ambulatory Visit (INDEPENDENT_AMBULATORY_CARE_PROVIDER_SITE_OTHER): Payer: Medicare PPO | Admitting: Orthopaedic Surgery

## 2021-06-18 ENCOUNTER — Encounter: Payer: Self-pay | Admitting: Orthopaedic Surgery

## 2021-06-18 DIAGNOSIS — M170 Bilateral primary osteoarthritis of knee: Secondary | ICD-10-CM | POA: Diagnosis not present

## 2021-06-18 DIAGNOSIS — M25572 Pain in left ankle and joints of left foot: Secondary | ICD-10-CM | POA: Diagnosis not present

## 2021-06-18 DIAGNOSIS — M1711 Unilateral primary osteoarthritis, right knee: Secondary | ICD-10-CM

## 2021-06-18 MED ORDER — METHYLPREDNISOLONE ACETATE 40 MG/ML IJ SUSP
80.0000 mg | INTRAMUSCULAR | Status: AC | PRN
  Administered 2021-06-18: 80 mg via INTRA_ARTICULAR

## 2021-06-18 MED ORDER — LIDOCAINE HCL 1 % IJ SOLN
5.0000 mL | INTRAMUSCULAR | Status: AC | PRN
  Administered 2021-06-18: 5 mL

## 2021-06-18 NOTE — Progress Notes (Signed)
Office Visit Note   Patient: Lauren Garrett           Date of Birth: 07-23-1945           MRN: [id number] Visit Date: 06/18/2021              Requested by: Haywood Pao, MD [address] [place],  [place] [address] PCP: Osborne Casco Fransico Him, MD  Chief Complaint  Patient presents with   Right Knee - Follow-up      HPI: Patient is here in follow-up for her right knee.  She had a left knee injection 2 weeks ago and is now here for the right.  She is also requesting orders for new braces for her knees and a lumbar support.  She says she has not gotten new knee braces in several years.  She has also had significant weight loss and her braces are ill fitting  Assessment & Plan: Visit Diagnoses:  1. Pain in left ankle and joints of left foot   2. Bilateral primary osteoarthritis of knee     Plan: Patient was injected today in the right knee without difficulty.  We have provided her with new brace orders.  These are to address her balance her neuropathy and arthritic issues.  We did give her a brace order for her back but she probably should check first with her back surgeon to see if there is a different brace he would prefer  Follow-Up Instructions: No follow-ups on file.   Ortho Exam  Patient is alert, oriented, no adenopathy, well-dressed, normal affect, normal respiratory effort. Right knee: No effusion no redness grinding with range of motion  Imaging: No results found. No images are attached to the encounter.  Labs: Lab Results  Component Value Date   ESRSEDRATE 14 02/02/2013     No results found for: ALBUMIN, PREALBUMIN, CBC  No results found for: MG No results found for: VD25OH  No results found for: PREALBUMIN CBC EXTENDED Latest Ref Rng & Units 10/19/2019 09/17/2015 11/27/2011  WBC 4.0 - 10.5 K/uL 4.6 5.7 -  RBC 3.87 - 5.11 MIL/uL 3.61(L) 3.81(L) -  HGB 12.0 - 15.0 g/dL 11.3(L) 11.9(L) 12.9  HCT 36.0 - 46.0 % 35.5(L) 35.9(L) 38.0  PLT 150 - 400 K/uL  358 252 -  NEUTROABS 1.7 - 7.7 K/uL - 2.5 -  LYMPHSABS 0.7 - 4.0 K/uL - 2.4 -     There is no height or weight on file to calculate BMI.  Orders:  No orders of the defined types were placed in this encounter.  No orders of the defined types were placed in this encounter.    Procedures: Large Joint Inj: R knee on 06/18/2021 3:02 PM Indications: pain and diagnostic evaluation Details: 25 G 1.5 in needle, anteromedial approach  Arthrogram: No  Medications: 80 mg methylPREDNISolone acetate 40 MG/ML; 5 mL lidocaine 1 % Outcome: tolerated well, no immediate complications Procedure, treatment alternatives, risks and benefits explained, specific risks discussed. Consent was given by the patient.     Clinical Data: No additional findings.  ROS:  All other systems negative, except as noted in the HPI. Review of Systems  Objective: Vital Signs: There were no vitals taken for this visit.  Specialty Comments:  No specialty comments available.  PMFS History: Patient Active Problem List   Diagnosis Date Noted   Bilateral primary osteoarthritis of knee 05/31/2020   Pain in left ankle and joints of left foot 01/17/2020   Elevated blood pressure  reading in office without diagnosis of hypertension 06/09/2017   Lipoma 12/28/2015   Dizziness and giddiness 09/27/2015   Tension headache 03/23/2013   Disturbance of skin sensation 02/02/2013   H/O cervical spine surgery 02/02/2013   Degeneration of lumbar or lumbosacral intervertebral disc 02/02/2013   Generalized anxiety disorder 02/02/2013   Hypercholesteremia    Past Medical History:  Diagnosis Date   Arthritis    Chronic pain    Headache    Hypercholesteremia    Hypertension    Spinal stenosis    Thyroid disease     Family History  Problem Relation Age of Onset   Dementia Mother    Lung cancer Father    Migraines Daughter    Breast cancer Neg Hx     Past Surgical History:  Procedure Laterality Date   NECK SURGERY      [phone]   Social History   Occupational History   Occupation: Rtired     Fish farm manager: RETIRED  Tobacco Use   Smoking status: Never   Smokeless tobacco: Never  Substance and Sexual Activity   Alcohol use: No   Drug use: No   Sexual activity: Not on file

## 2021-06-25 DIAGNOSIS — E039 Hypothyroidism, unspecified: Secondary | ICD-10-CM | POA: Diagnosis not present

## 2021-06-25 DIAGNOSIS — N1831 Chronic kidney disease, stage 3a: Secondary | ICD-10-CM | POA: Diagnosis not present

## 2021-06-25 DIAGNOSIS — D692 Other nonthrombocytopenic purpura: Secondary | ICD-10-CM | POA: Diagnosis not present

## 2021-06-25 DIAGNOSIS — I679 Cerebrovascular disease, unspecified: Secondary | ICD-10-CM | POA: Diagnosis not present

## 2021-06-25 DIAGNOSIS — E1129 Type 2 diabetes mellitus with other diabetic kidney complication: Secondary | ICD-10-CM | POA: Diagnosis not present

## 2021-06-25 DIAGNOSIS — D631 Anemia in chronic kidney disease: Secondary | ICD-10-CM | POA: Diagnosis not present

## 2021-06-25 DIAGNOSIS — I129 Hypertensive chronic kidney disease with stage 1 through stage 4 chronic kidney disease, or unspecified chronic kidney disease: Secondary | ICD-10-CM | POA: Diagnosis not present

## 2021-06-25 DIAGNOSIS — L659 Nonscarring hair loss, unspecified: Secondary | ICD-10-CM | POA: Diagnosis not present

## 2021-06-25 DIAGNOSIS — L680 Hirsutism: Secondary | ICD-10-CM | POA: Diagnosis not present

## 2021-06-25 DIAGNOSIS — M4800 Spinal stenosis, site unspecified: Secondary | ICD-10-CM | POA: Diagnosis not present

## 2021-06-25 DIAGNOSIS — E7800 Pure hypercholesterolemia, unspecified: Secondary | ICD-10-CM | POA: Diagnosis not present

## 2021-06-25 DIAGNOSIS — G629 Polyneuropathy, unspecified: Secondary | ICD-10-CM | POA: Diagnosis not present

## 2021-06-25 DIAGNOSIS — E1149 Type 2 diabetes mellitus with other diabetic neurological complication: Secondary | ICD-10-CM | POA: Diagnosis not present

## 2021-07-01 DIAGNOSIS — L680 Hirsutism: Secondary | ICD-10-CM | POA: Diagnosis not present

## 2021-07-01 NOTE — Progress Notes (Signed)
Cardiology Office Note:    Date:  07/02/2021   ID:  Lauren Garrett, DOB 10-02-1945, MRN [id number]  PCP:  Gaspar Garbe, MD  Cardiologist:  Lesleigh Noe, MD   Referring MD: Gaspar Garbe, MD   Chief Complaint  Patient presents with   Follow-up    Preoperative cardiovascular evaluation    History of Present Illness:    Lauren Garrett is a 76 y.o. female with a hx of elevated blood pressure without diagnosis of hypertension and hypercholesterolemia.  Last office visit was 2019 for cardiovascular preoperative clearance.   He is contemplating having lumbar disc surgery by Dr. Barbaraann Barthel and eventual bilateral knee replacement surgery at some later date.  She is here primarily to make sure that alarms for underlying significant cardiovascular disease are not present.  She has no CV symptoms.  Specifically, she denies orthopnea, PND, palpitations, syncope, lower extremity swelling, and dyspnea on exertion.  Past Medical History:  Diagnosis Date   Anemia    Arthritis    Cerebrovascular disease    Chronic kidney disease (CKD), stage III (moderate) (HCC)    Chronic pain    Depression    DJD (degenerative joint disease)    DM (diabetes mellitus) (HCC)    Hair loss    Headache    Heme positive stool    Hirsutism    Hypercholesteremia    Hyperlipidemia    Hypertension    IBS (irritable bowel syndrome)    Other nonthrombocytopenic purpura (HCC)    Peripheral neuropathy    Preglaucoma    Spinal stenosis    Spinal stenosis    Thyroid disease     Past Surgical History:  Procedure Laterality Date   NECK SURGERY     [phone]    Current Medications: Current Meds  Medication Sig   aspirin EC 81 MG tablet Take 81 mg by mouth daily.   atorvastatin (LIPITOR) 20 MG tablet Take 20 mg by mouth daily.   Cholecalciferol (VITAMIN D3) 2000 UNITS TABS Take 1 tablet by mouth daily.   diclofenac (VOLTAREN) 75 MG EC tablet Take 75 mg by mouth 2 (two) times daily.    diclofenac sodium (VOLTAREN) 1 % GEL Apply 2 g topically 4 (four) times daily as needed (pain).   Hydrocodone-Acetaminophen 5-300 MG TABS Take 5-300 mg by mouth every 6 (six) hours as needed (pain).    irbesartan (AVAPRO) 300 MG tablet Take 300 mg by mouth daily.    iron polysaccharides (NIFEREX) 150 MG capsule Take 150 mg by mouth 2 (two) times daily.   Multiple Vitamin (MULTIVITAMIN WITH MINERALS) TABS Take 1 tablet by mouth daily.   Omega-3 Fatty Acids (FISH OIL) 1200 MG CAPS Take 1 capsule by mouth daily.    SYNTHROID 88 MCG tablet Take 88 mcg by mouth daily.   tiZANidine (ZANAFLEX) 4 MG tablet Take half to one (2 to 4 mg) by mouth daily at bedtime.   triamterene-hydrochlorothiazide (MAXZIDE-25) 37.5-25 MG per tablet Take 1 tablet by mouth daily.   [DISCONTINUED] levothyroxine (SYNTHROID, LEVOTHROID) 112 MCG tablet Take 112 mcg by mouth daily before breakfast.     Allergies:   Other and Macrobid [nitrofurantoin monohydrate macrocrystals]   Social History   Socioeconomic History   Marital status: Married    Spouse name: Remi Deter   Number of children: 2   Years of education: master's    Highest education level: Not on file  Occupational History   Occupation: Rtired  Employer: RETIRED  Tobacco Use   Smoking status: Never   Smokeless tobacco: Never  Substance and Sexual Activity   Alcohol use: No   Drug use: No   Sexual activity: Not on file  Other Topics Concern   Not on file  Social History Narrative   Patient lives at home spouse.   Caffeine Use: rarely   Right Hand   Social Determinants of Health   Financial Resource Strain: Not on file  Food Insecurity: Not on file  Transportation Needs: Not on file  Physical Activity: Not on file  Stress: Not on file  Social Connections: Not on file     Family History: The patient's family history includes Dementia in her mother; Lung cancer in her father; Migraines in her daughter. There is no history of Breast  cancer.  ROS:   Please see the history of present illness.    Denies this peripheral edema.  Limited mobility due to knees and back.  1 recent significant fall related to her knees buckling.  Uses a cane.  Significant weight loss since I last saw her.  All other systems reviewed and are negative.  EKGs/Labs/Other Studies Reviewed:    The following studies were reviewed today: No cardiac imaging  EKG:  EKG normal sinus rhythm, normal PR, nonspecific J-point depression.  Compared to Sep 18, 2015, no changes noted.  Recent Labs: No results found for requested labs within last 8760 hours.  Recent Lipid Panel No results found for: CHOL, TRIG, HDL, CHOLHDL, VLDL, LDLCALC, LDLDIRECT  Physical Exam:    VS:  BP (!) 142/78   Pulse 86   Ht 5\' 9"  (1.753 m)   Wt 145 lb 12.8 oz (66.1 kg)   SpO2 98%   BMI 21.53 kg/m     Wt Readings from Last 3 Encounters:  07/02/21 145 lb 12.8 oz (66.1 kg)  09/06/20 144 lb (65.3 kg)  08/23/20 144 lb (65.3 kg)     GEN: Healthy appearing. No acute distress HEENT: Normal NECK: No JVD. LYMPHATICS: No lymphadenopathy CARDIAC: No murmur. RRR no gallop, or edema. VASCULAR:  Normal Pulses. No bruits. RESPIRATORY:  Clear to auscultation without rales, wheezing or rhonchi  ABDOMEN: Soft, non-tender, non-distended, No pulsatile mass, MUSCULOSKELETAL: No deformity  SKIN: Warm and dry NEUROLOGIC:  Alert and oriented x 3 PSYCHIATRIC:  Normal affect   ASSESSMENT:    1. Elevated blood pressure reading in office without diagnosis of hypertension   2. Preoperative cardiovascular examination   3. Hypercholesteremia   4. Prediabetes    PLAN:    In order of problems listed above:  Mild systolic blood pressure elevation today in the office.  No specific management recommended but close follow-up.  Ideal pressure 130/80 mmHg or less.  Above 140/90 mmHg, additional therapy needed.  No adjustments made today. No cardiac findings that would suggest high risk  relative to general anesthesia and surgery.  Cleared for any upcoming surgical procedures within the next 61-months. Continue intermediate intensity Lipitor.  Last LDL was 96 months ago. Weight loss has led to significant reduction in hemoglobin A1c to near normal at 5.6.  Continue with current diet.   Cleared for any upcoming surgery.  No change in therapy.  As needed follow-up as needed.   Medication Adjustments/Labs and Tests Ordered: Current medicines are reviewed at length with the patient today.  Concerns regarding medicines are outlined above.  Orders Placed This Encounter  Procedures   EKG 12-Lead   No orders of the defined  types were placed in this encounter.   Patient Instructions  Medication Instructions:  Your physician recommends that you continue on your current medications as directed. Please refer to the Current Medication list given to you today.  *If you need a refill on your cardiac medications before your next appointment, please call your pharmacy*   Lab Work: None If you have labs (blood work) drawn today and your tests are completely normal, you will receive your results only by: MyChart Message (if you have MyChart) OR A paper copy in the mail If you have any lab test that is abnormal or we need to change your treatment, we will call you to review the results.   Testing/Procedures: None   Follow-Up: At [facility], you and your health needs are our priority.  As part of our continuing mission to provide you with exceptional heart care, we have created designated Provider Care Teams.  These Care Teams include your primary Cardiologist (physician) and Advanced Practice Providers (APPs -  Physician Assistants and Nurse Practitioners) who all work together to provide you with the care you need, when you need it.  We recommend signing up for the patient portal called "MyChart".  Sign up information is provided on this After Visit Summary.  MyChart is used  to connect with patients for Virtual Visits (Telemedicine).  Patients are able to view lab/test results, encounter notes, upcoming appointments, etc.  Non-urgent messages can be sent to your provider as well.   To learn more about what you can do with MyChart, go to [email or web address].    Your next appointment:   As needed  The format for your next appointment:   In Person  Provider:   Lesleigh Noe, MD    Other Instructions     Signed, Lesleigh Noe, MD  07/02/2021 3:05 PM    [facility]

## 2021-07-02 ENCOUNTER — Other Ambulatory Visit: Payer: Self-pay

## 2021-07-02 ENCOUNTER — Ambulatory Visit: Payer: Medicare PPO | Admitting: Interventional Cardiology

## 2021-07-02 VITALS — BP 142/78 | HR 86 | Ht 69.0 in | Wt 145.8 lb

## 2021-07-02 DIAGNOSIS — R030 Elevated blood-pressure reading, without diagnosis of hypertension: Secondary | ICD-10-CM

## 2021-07-02 DIAGNOSIS — Z01810 Encounter for preprocedural cardiovascular examination: Secondary | ICD-10-CM | POA: Diagnosis not present

## 2021-07-02 DIAGNOSIS — E7800 Pure hypercholesterolemia, unspecified: Secondary | ICD-10-CM | POA: Diagnosis not present

## 2021-07-02 DIAGNOSIS — R7303 Prediabetes: Secondary | ICD-10-CM

## 2021-07-02 NOTE — Patient Instructions (Signed)
Medication Instructions:  ?Your physician recommends that you continue on your current medications as directed. Please refer to the Current Medication list given to you today. ? ?*If you need a refill on your cardiac medications before your next appointment, please call your pharmacy* ? ? ?Lab Work: ?None ?If you have labs (blood work) drawn today and your tests are completely normal, you will receive your results only by: ?MyChart Message (if you have MyChart) OR ?A paper copy in the mail ?If you have any lab test that is abnormal or we need to change your treatment, we will call you to review the results. ? ? ?Testing/Procedures: ?None ? ? ?Follow-Up: ?At [facility], you and your health needs are our priority.  As part of our continuing mission to provide you with exceptional heart care, we have created designated Provider Care Teams.  These Care Teams include your primary Cardiologist (physician) and Advanced Practice Providers (APPs -  Physician Assistants and Nurse Practitioners) who all work together to provide you with the care you need, when you need it. ? ?We recommend signing up for the patient portal called "MyChart".  Sign up information is provided on this After Visit Summary.  MyChart is used to connect with patients for Virtual Visits (Telemedicine).  Patients are able to view lab/test results, encounter notes, upcoming appointments, etc.  Non-urgent messages can be sent to your provider as well.   ?To learn more about what you can do with MyChart, go to [email or web address].   ? ?Your next appointment:   ?As needed ? ?The format for your next appointment:   ?In Person ? ?Provider:   ?Sinclair Grooms, MD  ? ? ?Other Instructions ?  ?

## 2021-07-09 DIAGNOSIS — E278 Other specified disorders of adrenal gland: Secondary | ICD-10-CM | POA: Diagnosis not present

## 2021-07-09 DIAGNOSIS — L680 Hirsutism: Secondary | ICD-10-CM | POA: Diagnosis not present

## 2021-07-11 DIAGNOSIS — N898 Other specified noninflammatory disorders of vagina: Secondary | ICD-10-CM | POA: Diagnosis not present

## 2021-07-11 DIAGNOSIS — N9089 Other specified noninflammatory disorders of vulva and perineum: Secondary | ICD-10-CM | POA: Diagnosis not present

## 2021-07-11 DIAGNOSIS — E278 Other specified disorders of adrenal gland: Secondary | ICD-10-CM | POA: Diagnosis not present

## 2021-07-11 DIAGNOSIS — N9489 Other specified conditions associated with female genital organs and menstrual cycle: Secondary | ICD-10-CM | POA: Diagnosis not present

## 2021-07-11 DIAGNOSIS — L680 Hirsutism: Secondary | ICD-10-CM | POA: Diagnosis not present

## 2021-07-11 DIAGNOSIS — R9389 Abnormal findings on diagnostic imaging of other specified body structures: Secondary | ICD-10-CM | POA: Diagnosis not present

## 2021-07-17 DIAGNOSIS — N9489 Other specified conditions associated with female genital organs and menstrual cycle: Secondary | ICD-10-CM | POA: Diagnosis not present

## 2021-07-17 DIAGNOSIS — E278 Other specified disorders of adrenal gland: Secondary | ICD-10-CM | POA: Diagnosis not present

## 2021-07-17 DIAGNOSIS — L680 Hirsutism: Secondary | ICD-10-CM | POA: Diagnosis not present

## 2021-08-06 DIAGNOSIS — N390 Urinary tract infection, site not specified: Secondary | ICD-10-CM | POA: Diagnosis not present

## 2021-08-13 ENCOUNTER — Telehealth: Payer: Self-pay | Admitting: Neurology

## 2021-08-13 NOTE — Telephone Encounter (Signed)
Pt's daughter, Lauren Garrett (on [place]) would like a call from the nurse to discuss sooner appt with Dr. Leonie Man. Incident with behavior and memory. Dr. Osborne Casco suggested pt schedule a sooner appt with Dr. Leonie Man.  ? ?Ms. Edwards did not wish elaborate on the incident. ?

## 2021-08-13 NOTE — Telephone Encounter (Signed)
Patient last seen 01/16/17 for paresthesias with mention of cognitive changes in previous notes. Her daughter is not on the DPR so I am unable to discuss the patient's medical care with her (only son and husband listed). However, I was able to reschedule the appt to 10/01/21 and I provided the daughter with the new appt time. The patient will also remain on the cancellation list. Reports cognition and behavior have declined. She will ask Dr. Osborne Casco to send over records for Dr. Leonie Man to review, prior to her appt. ?

## 2021-08-23 DIAGNOSIS — L680 Hirsutism: Secondary | ICD-10-CM | POA: Diagnosis not present

## 2021-09-12 DIAGNOSIS — L680 Hirsutism: Secondary | ICD-10-CM | POA: Diagnosis not present

## 2021-09-12 DIAGNOSIS — I10 Essential (primary) hypertension: Secondary | ICD-10-CM | POA: Diagnosis not present

## 2021-09-12 DIAGNOSIS — E2749 Other adrenocortical insufficiency: Secondary | ICD-10-CM | POA: Diagnosis not present

## 2021-09-12 DIAGNOSIS — E039 Hypothyroidism, unspecified: Secondary | ICD-10-CM | POA: Diagnosis not present

## 2021-09-12 DIAGNOSIS — E785 Hyperlipidemia, unspecified: Secondary | ICD-10-CM | POA: Diagnosis not present

## 2021-09-13 DIAGNOSIS — L680 Hirsutism: Secondary | ICD-10-CM | POA: Diagnosis not present

## 2021-09-13 DIAGNOSIS — E2749 Other adrenocortical insufficiency: Secondary | ICD-10-CM | POA: Diagnosis not present

## 2021-10-01 ENCOUNTER — Encounter: Payer: Self-pay | Admitting: Neurology

## 2021-10-01 ENCOUNTER — Ambulatory Visit: Payer: Medicare PPO | Admitting: Neurology

## 2021-10-01 VITALS — BP 160/72 | HR 99 | Ht 67.0 in | Wt 145.0 lb

## 2021-10-01 DIAGNOSIS — Z113 Encounter for screening for infections with a predominantly sexual mode of transmission: Secondary | ICD-10-CM | POA: Diagnosis not present

## 2021-10-01 DIAGNOSIS — G3184 Mild cognitive impairment, so stated: Secondary | ICD-10-CM | POA: Diagnosis not present

## 2021-10-01 DIAGNOSIS — R413 Other amnesia: Secondary | ICD-10-CM | POA: Diagnosis not present

## 2021-10-01 DIAGNOSIS — R202 Paresthesia of skin: Secondary | ICD-10-CM

## 2021-10-01 MED ORDER — GABAPENTIN 100 MG PO CAPS: 100.0000 mg | ORAL_CAPSULE | Freq: Two times a day (BID) | ORAL | 3 refills | Status: DC

## 2021-10-01 NOTE — Patient Instructions (Signed)
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  I recommend a trial of gabapentin 100 mg twice daily to be increased as tolerated and explained possible side effects and advised her to call me if needed.  We also discussed her memory loss and mild cognitive impairment and I recommend further evaluation by checking lab work for reversible causes and MRI scan of the brain.  I encouraged her to increase participation in cognitively challenging activities like solving crossword puzzles, playing bridge and sudoku.  We also discussed memory compensation strategies.  She was advised to follow-up with Dr. Cyndy Freeze for her back surgery she will return for follow-up with me in 6 months or call earlier if necessary.  Memory Compensation Strategies  Use "WARM" strategy.  W= write it down  A= associate it  R= repeat it  M= make a mental note  2.   You can keep a Social worker.  Use a 3-ring notebook with sections for the following: calendar, important names and phone numbers,  medications, doctors' names/phone numbers, lists/reminders, and a section to journal what you did  each day.   3.    Use a calendar to write appointments down.  4.    Write yourself a schedule for the day.  This can be placed on the calendar or in a separate section of the Memory Notebook.  Keeping a  regular schedule can help memory.  5.    Use medication organizer with sections for each day or morning/evening pills.  You may need help loading it  6.    Keep a basket, or pegboard by the door.  Place items that you need to take out with you in the basket or on the pegboard.  You may also want to  include a message board for reminders.  7.    Use sticky notes.  Place sticky notes with reminders in a place where the  task is performed.  For example: " turn off the  stove" placed by the stove, "lock the door" placed on the door at eye level, " take your medications" on  the bathroom mirror or by the place where you normally take your medications.  8.    Use alarms/timers.  Use while cooking to remind yourself to check on food or as a reminder to take your medicine, or as a  reminder to make a call, or as a reminder to perform another task, etc.
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
## 2021-10-04 LAB — NEUROPATHY PANEL
A/G Ratio: 1.1 (ref 0.7–1.7)
Albumin ELP: 3.5 g/dL (ref 2.9–4.4)
Alpha 1: 0.2 g/dL (ref 0.0–0.4)
Alpha 2: 0.6 g/dL (ref 0.4–1.0)
Angio Convert Enzyme: 59 U/L (ref 14–82)
Anti Nuclear Antibody (ANA): POSITIVE — AB
Beta: 0.9 g/dL (ref 0.7–1.3)
Gamma Globulin: 1.4 g/dL (ref 0.4–1.8)
Globulin, Total: 3.2 g/dL (ref 2.2–3.9)
Rheumatoid fact SerPl-aCnc: <10 IU/mL (ref <14.0)
Sed Rate: 37 mm/hr (ref 0–40)
TSH: 0.447 u[IU]/mL — ABNORMAL LOW (ref 0.450–4.500)
Total Protein: 6.7 g/dL (ref 6.0–8.5)
Vit D, 25-Hydroxy: 54.9 ng/mL (ref 30.0–100.0)
Vitamin B-12: 688 pg/mL (ref 232–1245)

## 2021-10-04 LAB — DEMENTIA PANEL
Homocysteine: 13.4 umol/L (ref 0.0–19.2)
RPR Ser Ql: NONREACTIVE

## 2021-10-07 ENCOUNTER — Telehealth: Payer: Self-pay | Admitting: Neurology

## 2021-10-07 NOTE — Telephone Encounter (Signed)
[place] [employer] Auth: [id number] exp. 10/07/21-11/06/21 sent to [facility]

## 2021-10-21 ENCOUNTER — Telehealth: Payer: Self-pay | Admitting: Neurology

## 2021-10-23 ENCOUNTER — Telehealth: Payer: Self-pay | Admitting: Neurology

## 2021-10-23 NOTE — Telephone Encounter (Signed)
This patient is extremely confused. She called me last week about getting her MRI set up and I gave her [facility] information because I sent her there and they had been trying to contact her. She then called me again trying to schedule with me and was very confused. I had a voice mail from her from 7/3 saying that he is very confused why she needs a MRI and does not remember scheduling it. She says that she looked online for reasons to get a MRI and she doesn't have the need for one. She said that last time she tried to get one she got sick and didn't finish it. Can you please reach out to her to try to help her confusion before we cancel her appointment July 10? She doesn't seem to remember seeing Dr. Leonie Man. Thanks

## 2021-10-23 NOTE — Telephone Encounter (Signed)
I called her son on [place]. States his mother was expressing the same concerns yesterday while they were together (did not feel she needed the MRI). He and his sister have spoken further with her about the need for the scan. The patient is in agreement to keep the appt.

## 2021-10-23 NOTE — Telephone Encounter (Signed)
Pt's daughter reports that pt has in her mind that the MRI scheduled at [facility] for 07-10 was not ordered by Dr Leonie Man.  Daughter believes that a call from RN would reassure pt that the MRI was in fact ordered by Dr Leonie Man and is something he believes that she needs.  If further clarity is needed daughter can be called

## 2021-10-23 NOTE — Telephone Encounter (Signed)
I have called the patient and discussed the rationale behind the ordered brain MRI. She is in agreement to move forward with the scan. There is also a separate phone note regarding this test.

## 2021-10-28 ENCOUNTER — Other Ambulatory Visit: Payer: Medicare PPO

## 2021-10-30 ENCOUNTER — Other Ambulatory Visit: Payer: Medicare PPO

## 2021-10-30 ENCOUNTER — Telehealth: Payer: Self-pay | Admitting: Neurology

## 2021-10-30 NOTE — Telephone Encounter (Addendum)
Per epic, pt is scheduled for 200 pm today for MRI.  I called the pt back, but vm was full and not accepting new messages, this was her mobile number.   I also called the home # and was able to connect with the pt. We had an extended conversation.   She sts her question is if it is safe for her to take the Vicodin along with the gabapentin. She reports the Vicodin was rx'd by her neurosurgeon (Dr. Christella Noa). Pt reports she has not taken the gabapentin yet and does not plan to take this medication due to possible interactions. I advised I would mention this to the MD.  Pt was also adamant that she does not want to do the MRI w/wo contrast and would like to do MRI wo contrast only. She will not attend MRI today even after 40 minute conversation on why this was ordered and how this was the original order as discussed in the 10/01/2021 appt. Will ask MD about changing order.

## 2021-10-30 NOTE — Telephone Encounter (Signed)
Pt is calling and wants to talk to a nurse before MRI today. Pt is requesting a return from the nurse/doctor.

## 2021-11-04 NOTE — Telephone Encounter (Signed)
Pt returned phone call, would like a call back.  

## 2021-11-04 NOTE — Telephone Encounter (Signed)
Recommendations from Dr. Leonie Man:  Mitchell Heir explained the to the patient that the oxycodone is for mechanical back pain and the gabapentin is for numbness tingling and pain in her feet.  The oxycodone does not help the feet pain and's gabapentin is necessary.  To my knowledge if she needs she may take both of them and it should not interact.   [name] Fila, MD  You 4 days ago    Okay to do MRI without contrast if patient insists    I attempted to reach the pt but VM was full.   Will try again later.

## 2021-11-05 NOTE — Telephone Encounter (Signed)
Pt called back stating that she was to be receiving a call at 4 pm but she forgot that she had somewhere to be. Pt states that she will call back tomorrow.

## 2021-11-05 NOTE — Telephone Encounter (Signed)
I spoke with her daughter and she is going to have conversation with her mother.

## 2021-11-05 NOTE — Telephone Encounter (Signed)
I spoke to the patient. After extended calls from our office, the patient is still not comfortable with proceeding with the MRI. I did notify her that the hydrocodone and gabapentin may be used together, as they are working on different symptoms.   I called her daughter to let her know we have done all we can to answer her questions. She is still resistant to the MRI. They will need to have any further conversations with her. If she agrees to the scan, they may call [facility] to reschedule.

## 2021-11-20 DIAGNOSIS — R9389 Abnormal findings on diagnostic imaging of other specified body structures: Secondary | ICD-10-CM | POA: Diagnosis not present

## 2021-11-20 DIAGNOSIS — L680 Hirsutism: Secondary | ICD-10-CM | POA: Diagnosis not present

## 2021-11-20 DIAGNOSIS — L659 Nonscarring hair loss, unspecified: Secondary | ICD-10-CM | POA: Diagnosis not present

## 2021-11-25 ENCOUNTER — Ambulatory Visit: Payer: Medicare PPO | Admitting: Neurology

## 2021-11-25 DIAGNOSIS — L680 Hirsutism: Secondary | ICD-10-CM | POA: Diagnosis not present

## 2021-12-06 DIAGNOSIS — E1129 Type 2 diabetes mellitus with other diabetic kidney complication: Secondary | ICD-10-CM | POA: Diagnosis not present

## 2021-12-06 DIAGNOSIS — E039 Hypothyroidism, unspecified: Secondary | ICD-10-CM | POA: Diagnosis not present

## 2021-12-06 DIAGNOSIS — E7800 Pure hypercholesterolemia, unspecified: Secondary | ICD-10-CM | POA: Diagnosis not present

## 2021-12-06 DIAGNOSIS — D649 Anemia, unspecified: Secondary | ICD-10-CM | POA: Diagnosis not present

## 2021-12-09 DIAGNOSIS — Z0000 Encounter for general adult medical examination without abnormal findings: Secondary | ICD-10-CM | POA: Diagnosis not present

## 2021-12-09 DIAGNOSIS — E1129 Type 2 diabetes mellitus with other diabetic kidney complication: Secondary | ICD-10-CM | POA: Diagnosis not present

## 2021-12-09 DIAGNOSIS — R82998 Other abnormal findings in urine: Secondary | ICD-10-CM | POA: Diagnosis not present

## 2021-12-12 DIAGNOSIS — E039 Hypothyroidism, unspecified: Secondary | ICD-10-CM | POA: Diagnosis not present

## 2021-12-12 DIAGNOSIS — E1129 Type 2 diabetes mellitus with other diabetic kidney complication: Secondary | ICD-10-CM | POA: Diagnosis not present

## 2021-12-12 DIAGNOSIS — I679 Cerebrovascular disease, unspecified: Secondary | ICD-10-CM | POA: Diagnosis not present

## 2021-12-12 DIAGNOSIS — M4800 Spinal stenosis, site unspecified: Secondary | ICD-10-CM | POA: Diagnosis not present

## 2021-12-12 DIAGNOSIS — E7800 Pure hypercholesterolemia, unspecified: Secondary | ICD-10-CM | POA: Diagnosis not present

## 2021-12-12 DIAGNOSIS — I129 Hypertensive chronic kidney disease with stage 1 through stage 4 chronic kidney disease, or unspecified chronic kidney disease: Secondary | ICD-10-CM | POA: Diagnosis not present

## 2021-12-12 DIAGNOSIS — N1831 Chronic kidney disease, stage 3a: Secondary | ICD-10-CM | POA: Diagnosis not present

## 2021-12-12 DIAGNOSIS — D692 Other nonthrombocytopenic purpura: Secondary | ICD-10-CM | POA: Diagnosis not present

## 2021-12-12 DIAGNOSIS — Z0000 Encounter for general adult medical examination without abnormal findings: Secondary | ICD-10-CM | POA: Diagnosis not present

## 2021-12-20 ENCOUNTER — Encounter: Payer: Self-pay | Admitting: Physician Assistant

## 2021-12-20 ENCOUNTER — Ambulatory Visit (INDEPENDENT_AMBULATORY_CARE_PROVIDER_SITE_OTHER): Payer: Medicare PPO | Admitting: Physician Assistant

## 2021-12-20 DIAGNOSIS — M170 Bilateral primary osteoarthritis of knee: Secondary | ICD-10-CM | POA: Diagnosis not present

## 2021-12-20 MED ORDER — METHYLPREDNISOLONE ACETATE 40 MG/ML IJ SUSP
80.0000 mg | INTRAMUSCULAR | Status: AC | PRN
  Administered 2021-12-20: 80 mg via INTRA_ARTICULAR

## 2021-12-20 MED ORDER — LIDOCAINE HCL 1 % IJ SOLN
2.0000 mL | INTRAMUSCULAR | Status: AC | PRN
  Administered 2021-12-20: 2 mL

## 2021-12-20 NOTE — Progress Notes (Signed)
Office Visit Note   Patient: Lauren Garrett           Date of Birth: December 27, 1945           MRN: [id number] Visit Date: 12/20/2021              Requested by: Haywood Pao, MD [address] [place],  [place] [address] PCP: Osborne Casco Fransico Him, MD  Chief Complaint  Patient presents with  . Left Knee - Follow-up  . Right Knee - Follow-up      HPI: Patient is a 76 year old woman with bilateral arthritis of her knees.  She is a patient of Dr. Rudene Anda.  She comes in today and is requesting bilateral knee injections.  No new injury.  Last injections were 6 months ago  Assessment & Plan: Visit Diagnoses:  1. Bilateral primary osteoarthritis of knee     Plan:   Follow-Up Instructions: As needed  Ortho Exam  Patient is alert, oriented, no adenopathy, well-dressed, normal affect, normal respiratory effort. Bilateral knee she has valgus alignment.  No effusions no redness compartments of the lower leg are soft and nontender  Imaging: No results found. No images are attached to the encounter.  Labs: Lab Results  Component Value Date   ESRSEDRATE 37 10/01/2021   ESRSEDRATE 14 02/02/2013     No results found for: "ALBUMIN", "PREALBUMIN", "CBC"  No results found for: "MG" Lab Results  Component Value Date   VD25OH 54.9 10/01/2021    No results found for: "PREALBUMIN"    Latest Ref Rng & Units 10/19/2019    3:37 PM 09/17/2015    7:03 PM 11/27/2011   12:20 PM  CBC EXTENDED  WBC 4.0 - 10.5 K/uL 4.6  5.7    RBC 3.87 - 5.11 MIL/uL 3.61  3.81    Hemoglobin 12.0 - 15.0 g/dL 11.3  11.9  12.9   HCT 36.0 - 46.0 % 35.5  35.9  38.0   Platelets 150 - 400 K/uL 358  252    NEUT# 1.7 - 7.7 K/uL  2.5    Lymph# 0.7 - 4.0 K/uL  2.4       There is no height or weight on file to calculate BMI.  Orders:  No orders of the defined types were placed in this encounter.  No orders of the defined types were placed in this encounter.    Procedures: Large Joint Inj:  bilateral knee on 12/20/2021 3:37 PM Indications: pain and diagnostic evaluation Details: 25 G 1.5 in needle, anteromedial approach  Arthrogram: No  Medications (Right): 2 mL lidocaine 1 %; 80 mg methylPREDNISolone acetate 40 MG/ML Medications (Left): 2 mL lidocaine 1 %; 80 mg methylPREDNISolone acetate 40 MG/ML Outcome: tolerated well, no immediate complications Procedure, treatment alternatives, risks and benefits explained, specific risks discussed. Consent was given by the patient.     Clinical Data: No additional findings.  ROS:  All other systems negative, except as noted in the HPI. Review of Systems  Objective: Vital Signs: There were no vitals taken for this visit.  Specialty Comments:  No specialty comments available.  PMFS History: Patient Active Problem List   Diagnosis Date Noted  . Bilateral primary osteoarthritis of knee 05/31/2020  . Pain in left ankle and joints of left foot 01/17/2020  . Elevated blood pressure reading in office without diagnosis of hypertension 06/09/2017  . 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 Dizziness and giddiness 09/27/2015  . Tension headache 03/23/2013  . Disturbance of skin sensation 02/02/2013  .  H/O cervical spine surgery 02/02/2013  . Degeneration of lumbar or lumbosacral intervertebral disc 02/02/2013  . Generalized anxiety disorder 02/02/2013  . Hypercholesteremia    Past Medical History:  Diagnosis Date  . Anemia   . Arthritis   . Cerebrovascular disease   . Chronic kidney disease (CKD), stage III (moderate) (HCC)   . Chronic pain   . Depression   . DJD (degenerative joint disease)   . DM (diabetes mellitus) ([place])   . Hair loss   . Headache   . Heme positive stool   . Hirsutism   . Hypercholesteremia   . Hyperlipidemia   . Hypertension   . IBS (irritable bowel syndrome)   . 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 Peripheral neuropathy   . Preglaucoma   . Spinal stenosis   . Spinal stenosis   . Thyroid disease      Family History  Problem Relation Age of Onset  . Dementia Mother   . Lung cancer Father   . Migraines Daughter   . Breast cancer Neg Hx     Past Surgical History:  Procedure Laterality Date  . NECK SURGERY     [id number]   Social History   Occupational History  . Occupation: Rtired     Fish farm manager: RETIRED  Tobacco Use  . Smoking status: Never  . Smokeless tobacco: Never  Substance and Sexual Activity  . Alcohol use: No  . Drug use: No  . Sexual activity: Not on file

## 2022-01-06 ENCOUNTER — Telehealth: Payer: Self-pay | Admitting: Neurology

## 2022-01-06 NOTE — Telephone Encounter (Signed)
Pt has concerns as she has been advised not to take the Hydrocodone-Acetaminophen 5-300 MG TABS & gabapentin (NEURONTIN) 100 MG capsule together.  Please call pt on home #

## 2022-01-07 NOTE — Telephone Encounter (Signed)
This pt has called multiple times and several nurses have had extended conversations on this question. Dr. Leonie Man has stated the medications can be taken together and we have nothing further to add on this.

## 2022-01-08 ENCOUNTER — Telehealth: Payer: Self-pay | Admitting: Neurology

## 2022-01-08 ENCOUNTER — Ambulatory Visit
Admission: RE | Admit: 2022-01-08 | Discharge: 2022-01-08 | Disposition: A | Discharge status: Home / Self Care | Payer: Medicare PPO | Source: Ambulatory Visit | Attending: Neurology | Admitting: Neurology

## 2022-01-08 DIAGNOSIS — R413 Other amnesia: Secondary | ICD-10-CM | POA: Diagnosis not present

## 2022-01-08 MED ORDER — GADOBENATE DIMEGLUMINE 529 MG/ML IV SOLN
13.0000 mL | Freq: Once | INTRAVENOUS | Status: AC | PRN
  Administered 2022-01-08: 13 mL via INTRAVENOUS

## 2022-01-08 NOTE — Telephone Encounter (Signed)
I spoke with the patient and reminded her of the previous conversations that she has had with [place] regarding her MRI. I informed her it is okay for her to continue to take gabapentin 100 mg capsule before the scan. She was unsure if she would be able to make it to her MRI appointment. I encouraged the patient to follow Dr. Clydene Fake plan. She stated she will call back with further questions if needed.

## 2022-01-08 NOTE — Telephone Encounter (Signed)
Pt is calling. Stated she is having a brain scan today and wants to know if Dr. Leonie Man wants her get the brain scan. Pt asked if she should take the gabapentin (NEURONTIN) 100 MG capsule before she goes for her scan. Pt is requesting a call back from nurse

## 2022-01-11 NOTE — Progress Notes (Signed)
Kindly inform the patient that MRI scan study of the brain shows mild shrinkage of the brain and hardening of the arteries which have slightly progressed compared to previous MRI from 2018 but this is to be expected.  Nothing to worry about

## 2022-01-13 ENCOUNTER — Telehealth: Payer: Self-pay

## 2022-01-13 NOTE — Telephone Encounter (Signed)
-----   Message from [name] Fila, MD sent at 01/11/2022  3:08 PM EDT ----- Kindly inform the patient that MRI scan study of the brain shows mild shrinkage of the brain and hardening of the arteries which have slightly progressed compared to previous MRI from 2018 but this is to be expected.  Nothing to worry about

## 2022-01-13 NOTE — Telephone Encounter (Signed)
Pt verified by name and DOB, results given per provider, pt voiced understanding all question answered. 

## 2022-01-14 ENCOUNTER — Telehealth: Payer: Self-pay | Admitting: *Deleted

## 2022-01-14 NOTE — Telephone Encounter (Signed)
   Name: Lauren Garrett  DOB: [birth year]  MRN: [id number]  Primary Cardiologist: Sinclair Grooms, MD  Chart reviewed as part of pre-operative protocol coverage. Because of Lauren Garrett's past medical history and time since last visit, she will require a follow-up virtual office visit in order to better assess preoperative cardiovascular risk.Not seen in 6 months.   May hold Aspirin 7 days prior to planned procedure.   Will route to surgical team so they are aware.   Pre-op covering staff: - Please schedule appointment and call patient to inform them. If patient already had an upcoming appointment within acceptable timeframe, please add "pre-op clearance" to the appointment notes so provider is aware. - Please contact requesting surgeon's office via preferred method (i.e, phone, fax) to inform them of need for appointment prior to surgery.   Loel Dubonnet, NP  01/14/2022, 2:52 PM

## 2022-01-14 NOTE — Telephone Encounter (Signed)
   Pre-operative Risk Assessment    Patient Name: Lauren Garrett  DOB: 08-13-1945 MRN: [id number]      Request for Surgical Clearance    Procedure:   HYSTEROSCOPY, D&C WITH MYOSURE POLYPECTOMY  Date of Surgery:  Clearance 02/05/22                                 Surgeon:  DR. TARA COLE Surgeon's Group or Practice Name:  [facility] Phone number:  [phone] Fax number:  [phone] ATTN: [place]    Type of Clearance Requested:   - Medical ; ASA    Type of Anesthesia:   CHOICE   Additional requests/questions:    [name]   01/14/2022, 11:57 AM

## 2022-01-15 NOTE — Telephone Encounter (Signed)
I left a message for the patient to call our office back to schedule an virtual visit.

## 2022-01-16 DIAGNOSIS — R9389 Abnormal findings on diagnostic imaging of other specified body structures: Secondary | ICD-10-CM | POA: Diagnosis not present

## 2022-01-20 NOTE — Telephone Encounter (Signed)
2nd attempt to reach pt regarding surgical clearance and the need for a tele visit.  No answer / no voicemail.

## 2022-01-21 ENCOUNTER — Telehealth: Payer: Self-pay | Admitting: *Deleted

## 2022-01-21 NOTE — Telephone Encounter (Signed)
I s/w both the pt and her husband. Both are agreeable to plan of care for tele pre op appt 01/30/22 @ 2:20. Med rec and consent are done.      Patient Consent for Virtual Visit        Lauren Garrett has provided verbal consent on 01/21/2022 for a virtual visit (video or telephone).   CONSENT FOR VIRTUAL VISIT FOR:  Lauren Garrett  By participating in this virtual visit I agree to the following:  I hereby voluntarily request, consent and authorize [facility] and its employed or contracted physicians, physician assistants, nurse practitioners or other licensed health care professionals (the Practitioner), to provide me with telemedicine health care services (the "Services") as deemed necessary by the treating Practitioner. I acknowledge and consent to receive the Services by the Practitioner via telemedicine. I understand that the telemedicine visit will involve communicating with the Practitioner through live audiovisual communication technology and the disclosure of certain medical information by electronic transmission. I acknowledge that I have been given the opportunity to request an in-person assessment or other available alternative prior to the telemedicine visit and am voluntarily participating in the telemedicine visit.  I understand that I have the right to withhold or withdraw my consent to the use of telemedicine in the course of my care at any time, without affecting my right to future care or treatment, and that the Practitioner or I may terminate the telemedicine visit at any time. I understand that I have the right to inspect all information obtained and/or recorded in the course of the telemedicine visit and may receive copies of available information for a reasonable fee.  I understand that some of the potential risks of receiving the Services via telemedicine include:  Delay or interruption in medical evaluation due to technological equipment failure or  disruption; Information transmitted may not be sufficient (e.g. poor resolution of images) to allow for appropriate medical decision making by the Practitioner; and/or  In rare instances, security protocols could fail, causing a breach of personal health information.  Furthermore, I acknowledge that it is my responsibility to provide information about my medical history, conditions and care that is complete and accurate to the best of my ability. I acknowledge that Practitioner's advice, recommendations, and/or decision may be based on factors not within their control, such as incomplete or inaccurate data provided by me or distortions of diagnostic images or specimens that may result from electronic transmissions. I understand that the practice of medicine is not an exact science and that Practitioner makes no warranties or guarantees regarding treatment outcomes. I acknowledge that a copy of this consent can be made available to me via my patient portal ([facility]), or I can request a printed copy by calling the office of [facility].    I understand that my insurance will be billed for this visit.   I have read or had this consent read to me. I understand the contents of this consent, which adequately explains the benefits and risks of the Services being provided via telemedicine.  I have been provided ample opportunity to ask questions regarding this consent and the Services and have had my questions answered to my satisfaction. I give my informed consent for the services to be provided through the use of telemedicine in my medical care

## 2022-01-21 NOTE — Telephone Encounter (Signed)
I s/w both the pt and her husband. Both are agreeable to plan of care for tele pre op appt 01/30/22 @ 2:20. Med rec and consent are done.   Pt states she has begun holding her ASA as of today, her last dose was yesterday per the pt.

## 2022-01-21 NOTE — Telephone Encounter (Signed)
I s/w the pt to set up a tele pre op appt. Pt begins to tell me that she thought her surgery was sooner than the 02/05/22 with Dr. Landry Mellow. Pt then states she does not have her recorder on to record the call. I asked the pt if she records all of her calls, pt said no. Pt states she wants to wait until her husband gets home so they can both talk to me. I tried multiple times that any questions or concerns they have will need to be d/w the pre op APP. Pt kept saying she was in pain and just was not in a good place right now for our call. She asked for me to call back. I asked if it would be ok if I call her back after 5 today, pt agreeable to plan of care. Pt also tells me that she is supposed to be having back surgery as well. I informed the pt that we have not received anything about a back surgery, but only from Dr. Christophe Louis surgery at this time. Pt seemed confused and flustered about dates maybe for her procedures.   I do see in ov note from DR. Tamala Julian that he states he has cleared the pt for her surgery. I have asked the pre op provider to review as well.

## 2022-01-30 ENCOUNTER — Encounter (HOSPITAL_BASED_OUTPATIENT_CLINIC_OR_DEPARTMENT_OTHER): Payer: Self-pay | Admitting: Obstetrics and Gynecology

## 2022-01-30 ENCOUNTER — Ambulatory Visit: Payer: Medicare PPO | Attending: Cardiology | Admitting: Physician Assistant

## 2022-01-30 DIAGNOSIS — Z01810 Encounter for preprocedural cardiovascular examination: Secondary | ICD-10-CM

## 2022-01-30 NOTE — Progress Notes (Signed)
Virtual Visit via Telephone Note   Because of Lauren Garrett's co-morbid illnesses, she is at least at moderate risk for complications without adequate follow up.  This format is felt to be most appropriate for this patient at this time.  The patient did not have access to video technology/had technical difficulties with video requiring transitioning to audio format only (telephone).  All issues noted in this document were discussed and addressed.  No physical exam could be performed with this format.  Please refer to the patient's chart for her consent to telehealth for [facility].  Evaluation Performed:  Preoperative cardiovascular risk assessment _____________   Date:  01/30/2022   Patient ID:  Lauren Garrett, DOB October 07, 1945, MRN [id number] Patient Location:  Home Provider location:   Office  Primary Care Provider:  Haywood Pao, MD Primary Cardiologist:  Sinclair Grooms, MD  Chief Complaint / Patient Profile   76 y.o. y/o female with a h/o chronic pain, cerebrovascular disease, hyperlipidemia, stage III CKD, hypertension, hypothyroidism who is pending hysterectomy, D&C with MyoSure polypectomy and presents today for telephonic preoperative cardiovascular risk assessment.  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 female who presents via Engineer, civil (consulting) for a telehealth visit today.  Pt was last seen in cardiology clinic on 07/02/2021 by Dr. Tamala Julian.  At that time Lauren Garrett was doing well .  The patient is now pending procedure as outlined above. Since her last visit, she tells me that she has a bad back with some disc issues.  Both of her knees also need surgery.  She is on Vicodin when she needs it.  She can walk and does stairs about once a day.  Her mobility is limited due to her orthopedic issues.  She has scored a 4.64 on the DASI which exceeds the minimum 4 METS requirement.  She thinks she has already been holding her aspirin.  She can hold her aspirin 7 days prior to the procedure.  Please restart medically safe to do so.  Home Medications    Prior to Admission medications   Medication Sig Start Date End Date Taking? Authorizing Provider  aspirin EC 81 MG tablet Take 81 mg by mouth daily.    [provider]  atorvastatin (LIPITOR) 20 MG tablet Take 20 mg  by mouth daily.    [provider]  Cholecalciferol (VITAMIN D3) 2000 UNITS TABS Take 1 tablet by mouth daily.    [provider]  diclofenac (VOLTAREN) 75 MG EC tablet Take 75 mg by mouth 2 (two) times daily.    [provider]  diclofenac sodium (VOLTAREN) 1 % GEL Apply 2 g topically 4 (four) times daily as needed (pain).    [provider]  gabapentin (NEURONTIN) 100 MG capsule Take 1 capsule (100 mg total) by mouth 2 (two) times daily. 10/01/21   [name] Fila, MD  Hydrocodone-Acetaminophen 5-300 MG TABS Take 5-300 mg by mouth every 6 (six) hours as needed (pain).     [provider]   irbesartan (AVAPRO) 300 MG tablet Take 300 mg by mouth daily.  07/28/16   [provider]  iron polysaccharides (NIFEREX) 150 MG capsule Take 150 mg by mouth 2 (two) times daily.    [provider]  Multiple Vitamin (MULTIVITAMIN WITH MINERALS) TABS Take 1 tablet by mouth daily.    [provider]  Omega-3 Fatty Acids (FISH OIL) 1200 MG CAPS Take 1 capsule by mouth daily.     [provider]  SYNTHROID 88 MCG tablet Take 88 mcg by mouth daily. 06/26/21   [provider]  tiZANidine (ZANAFLEX) 4 MG tablet Take half to one (2 to 4 mg) by mouth daily at bedtime.    [provider]  triamterene-hydrochlorothiazide (MAXZIDE-25) 37.5-25 MG per tablet Take 1 tablet by mouth daily.    [provider]    Physical Exam    Vital Signs:  Lauren Garrett does not have vital signs available for review today.  Given telephonic nature of communication, physical exam is limited. AAOx3. NAD. Normal affect.  Speech and respirations are unlabored.  Accessory Clinical Findings    None  Assessment & Plan    1.  Preoperative Cardiovascular Risk Assessment:  Lauren Garrett's perioperative risk of a major cardiac event is 6.6% according to the Revised Cardiac Risk Index (RCRI).  Therefore, she is at high risk for perioperative complications.   Her functional capacity is fair at 4.64 METs according to the [place] Activity Status Index (DASI). Recommendations: According to ACC/AHA guidelines, no further cardiovascular testing needed.  The patient may proceed to surgery at acceptable risk.   Antiplatelet and/or Anticoagulation Recommendations: Aspirin can be held for 7 days prior to her surgery.  Please resume Aspirin post operatively when it is felt to be safe from a bleeding standpoint.    A copy of this note will be routed to requesting surgeon.  Time:   Today, I have spent 30 minutes with the patient with telehealth technology discussing medical  history, symptoms, and management plan.     Elgie Collard, PA-C  01/30/2022, 1:31 PM

## 2022-01-30 NOTE — Progress Notes (Signed)
Spoke w/ via phone for pre-op interview--- Lauren Garrett needs dos----   ISTAT . Will need EKG if one is not done at cardiac clearance appointment 01/30/22.          Garrett results------ COVID test -----patient states asymptomatic no test needed Arrive at -------1200 NPO after MN NO Solid Food.   Med rec completed Medications to take morning of surgery ----- Synthroid Diabetic medication ----- Patient instructed no nail polish to be worn day of surgery Patient instructed to bring photo id and insurance card day of surgery Patient aware to have Driver (ride ) / caregiver  Lauren Garrett husband  for 24 hours after surgery  Patient Special Instructions ----- Pre-Op special Istructions ----- Patient verbalized understanding of instructions that were given at this phone interview. Patient denies shortness of breath, chest pain, fever, cough at this phone interview.    Awaiting Cardiac Clearance, appointment with Dr Tamala Julian at 1430.

## 2022-01-31 NOTE — Progress Notes (Signed)
Pt has cardiac clearance via telephone visit by Nicholes Rough PA ON 01-30-2022 in epic / chart. Also, pt has current ekg in epic/ chart dated 07-02-2021, will not need one dos.

## 2022-02-03 ENCOUNTER — Other Ambulatory Visit: Payer: Self-pay | Admitting: Obstetrics and Gynecology

## 2022-02-03 DIAGNOSIS — R9389 Abnormal findings on diagnostic imaging of other specified body structures: Secondary | ICD-10-CM

## 2022-02-03 NOTE — H&P (Deleted)
  The note originally documented on this encounter has been moved the the encounter in which it belongs.  

## 2022-02-03 NOTE — H&P (Signed)
Subjective:    Chief Complaint(s):    Preop    HPI:      General          76 y/o presents for preop visit. Pt is schedule for hysteroscopy D&C and possible polypectomy with myosure on 02/05/2022 for the management of thickened endometrium.            Ultrasound was performed with an incidental finding of thickened endometrium with hyperechoic mass. pt denies postmenopausal bleeding.            Pelvic US 07/09/2021 notable for uterus measuring 5.72 x 2.94 x 4.28 cm with thickened endometrium (1.22cm). Endometrium is slightly fluid-filled with a hyperechoic mass with tiny cystic areas measuring 1.5 x 1.3 x 0.9 cm; no blood flow noted. Right ovary contains three simple cysts (1.2 cm, 0.9 cm, 0.7 cm). Left ovary contains multiple tiny simple cysts less than 0.6 cm, avascular. Left ovary with tiny calcifications. No adnexal masses.     Current Medication:     Taking    Nu-Iron(Polysaccharide Iron Complex) 150 MG Capsule 1 capsule Orally two times daily.    Synthroid(Levothyroxine Sodium) 88 MCG Tablet 1 tablet Orally Once a day.    Aspirin 81 MG Tablet Chewable 1 tablet Orally Once a day.    Diclofenac Sodium 75 MG Tablet Delayed Release 1 tablet Orally Once a day.    HYDROcodone-Acetaminophen 5-300 MG Tablet 1 tablet as needed Orally every 6 hrs.    Multi For Her - Tablet Orally daily.    Vitamin C 500 MG Capsule 1 tablet Orally twice a day.    tiZANidine HCl 4 MG Tablet 1 tablet as needed Orally as needed , Notes to Pharmacist: once daily.    Triamterene-HCTZ 37.5-25 MG Tablet 1 tablet in the morning Orally Once a day.    Atorvastatin Calcium 20 MG Tablet 1 tablet Orally Once a day.    Diclofenac Sodium-Menthol Gel 1.5 & 4 % Therapy Pack as directed Externally.    Vitamin D 50 MCG (2000 UT) Tablet 1 tablet Orally Once a day.    Irbesartan 300 MG Tablet 1 tablet Orally Once a day.     Not-Taking    Vaniqa(Eflornithine HCl) 70.2 % Cream 1 application Externally, apply to face Twice  a day at least 8 hours apart.    Vaniqa(Eflornithine HCl) 63.7 % Cream 1 application Externally Twice a day.   Medication List reviewed and reconciled with the patient.    Medical History:    hypertension    anemia    thyroid issues    Severe spinal stenosis-Chronic pain    high cholesterol    both knee    back pain    Allergies/Intolerance:      Macrobid: Allergy - hives    Gyn History:    Sexual activity not currently sexually active.  Periods : postmenopausal in her 89s.  Denies LMP.  Denies Birth control none.  Last pap smear date 09/15/2017 Neg/HPVneg.  Last mammogram date 11/2019.  Denies Abnormal pap smear no.  Denies STD none.  Menarche [age].     OB History:    Number of pregnancies  3.  Pregnancy # 1  miscarriage.  Pregnancy # 2  live birth, vaginal delivery.  Pregnancy # 3  live birth, vaginal delivery.     Surgical History:    neck surgery x 2    colonoscopy x2    d & c and hysteroscopy    colonoscopy 02/2015  colonoscopy 12/2020    Hospitalization:    No Hospitalization History.    Family History:   Father: deceased, diagnosed with Lung Cancer   Mother: deceased, dementia, diagnosed with Hypertension   Brother 1: deceased   1 brother(s) .        denies any GYN family cancer hx, Half brother s\/p Kidney transplant.    Social History:      General         Tobacco use cigarettes:  Never smoked, Tobacco history last updated  01/16/2022, Vaping  No.           EXPOSURE TO PASSIVE SMOKE: no, never.          Alcohol: no.          Caffeine: no.          Recreational drug use: no.          Exercise: minimal due to spinal stenosis.          Marital Status: married.          Children: 2 children.          OCCUPATION: retired.          Tobacco Exposure: no.    ROS:      CONSTITUTIONAL         Chills  No.  Fatigue  No.  Fever  No.  Night sweats  No.  Recent travel outside [place]  No.  Sweats  No.  Weight change  No.       OPHTHALMOLOGY          Blurring of vision  no.  Change in vision  no.  Double vision  no.       ENT         Dizziness  no.  Nose bleeds  no.  Sore throat  no.  Teeth pain  no.       ALLERGY         Hives  no.       CARDIOLOGY         Chest pain  no.  High blood pressure  no.  Irregular heart beat  no.  Leg edema  no.  Palpitations  no.       RESPIRATORY         Shortness of breath  no.  Cough  no.  Wheezing  no.       UROLOGY         Pain with urination  no.  Urinary urgency  no.  Urinary frequency  no.  Urinary incontinence  no.  Difficulty urinating  No.  Blood in urine  No.       GASTROENTEROLOGY         Abdominal pain  no.  Appetite change  no.  Bloating/belching  no.  Blood in stool or on toilet paper  no.  Change in bowel movements  no.  Constipation  no.  Diarrhea  no.  Difficulty swallowing  no.  Nausea  no.       FEMALE REPRODUCTIVE         Vulvar pain  no.  Vulvar rash  no.  Abnormal vaginal bleeding  no.  Breast pain  no.  Nipple discharge  no.  Pain with intercourse  no.  Pelvic pain  no.  Unusual vaginal discharge  no.  Vaginal itching  no.       MUSCULOSKELETAL         Back pain  yes.  Muscle aches  yes.       NEUROLOGY         Headache  no.  Tingling/numbness  no.  Weakness  no.       PSYCHOLOGY         Depression  no.  Anxiety  no.  Nervousness  no.  Sleep disturbances  no.  Suicidal ideation  no .       ENDOCRINOLOGY         Excessive thirst  no.  Excessive urination  no.  Hair loss  , yes.  Heat or cold intolerance  no.       HEMATOLOGY/LYMPH         Abnormal bleeding  no.  Easy bruising  no.  Swollen glands  no.       DERMATOLOGY         New/changing skin lesion  no.  Rash  no.  Sores  no.         Negative except as stated in HPI.  Objective:    Vitals:        Wt: 139.4, Wt change: -2.6 lbs, Ht: 67, BMI: 21.83, Pulse sitting: 99, BP sitting: 145/70.    Past Results:    Examination:      General Examination         CONSTITUTIONAL: alert, oriented, NAD.          SKIN: moist, warm.           EYES: Conjunctiva clear.          LUNGS: good I:E efffort noted.          ABDOMEN: soft, non-tender/non-distended, bowel sounds present.          FEMALE GENITOURINARY: normal external genitalia, labia - unremarkable, vagina - pink moist mucosa, no lesions or abnormal discharge, cervix - no discharge or lesions or CMT, adnexa - no masses or tenderness, uterus - nontender and normal size on palpation.          PSYCH: affect normal, good eye contact.     Physical Examination:   Assessment:    Assessment:    Endometrial thickening on ultrasound - R93.89   Plan:    Treatment:     Endometrial thickening on ultrasound          Notes: Planning hysteroscopy D/C polypectomy with myosure. Pt is advised she will be able to return home the same day if she is doing well. Discussed risks of hysteroscopy including but not limited to infection, bleeding, possible perforation of the uterus, with the need for further surgery. Pt advised to avoid NSAIDs (Aspirin, Aleve, Advil, Ibuprofen, Motrin) from now until surgery given risk of bleeding during surgery. She may take Tylenol for pain management. She is advised to avoid eating or drinking starting midnight prior to surgery. Discussed post-surgery avoidance of driving for 24 hours or intercourse for 2 weeks after procedure.

## 2022-02-03 NOTE — Telephone Encounter (Signed)
Our office received a duplicate request. Clearance notes from Nicholes Rough, [facility] were faxed over on 01/30/22. I will re-fax the notes. Pt has been cleared.

## 2022-02-05 ENCOUNTER — Encounter (HOSPITAL_BASED_OUTPATIENT_CLINIC_OR_DEPARTMENT_OTHER)
Admission: RE | Disposition: A | Discharge status: Home / Self Care | Payer: Self-pay | Source: Home / Self Care | Attending: Obstetrics and Gynecology

## 2022-02-05 ENCOUNTER — Other Ambulatory Visit: Payer: Self-pay

## 2022-02-05 ENCOUNTER — Ambulatory Visit (HOSPITAL_BASED_OUTPATIENT_CLINIC_OR_DEPARTMENT_OTHER): Payer: Medicare PPO | Admitting: Anesthesiology

## 2022-02-05 ENCOUNTER — Encounter (HOSPITAL_BASED_OUTPATIENT_CLINIC_OR_DEPARTMENT_OTHER): Payer: Self-pay | Admitting: Obstetrics and Gynecology

## 2022-02-05 ENCOUNTER — Ambulatory Visit (HOSPITAL_BASED_OUTPATIENT_CLINIC_OR_DEPARTMENT_OTHER)
Admission: RE | Admit: 2022-02-05 | Discharge: 2022-02-05 | Disposition: A | Discharge status: Home / Self Care | Payer: Medicare PPO | Attending: Obstetrics and Gynecology | Admitting: Obstetrics and Gynecology

## 2022-02-05 DIAGNOSIS — N840 Polyp of corpus uteri: Secondary | ICD-10-CM | POA: Insufficient documentation

## 2022-02-05 DIAGNOSIS — R9389 Abnormal findings on diagnostic imaging of other specified body structures: Secondary | ICD-10-CM | POA: Diagnosis not present

## 2022-02-05 DIAGNOSIS — I10 Essential (primary) hypertension: Secondary | ICD-10-CM | POA: Diagnosis not present

## 2022-02-05 DIAGNOSIS — R030 Elevated blood-pressure reading, without diagnosis of hypertension: Secondary | ICD-10-CM

## 2022-02-05 DIAGNOSIS — E039 Hypothyroidism, unspecified: Secondary | ICD-10-CM | POA: Diagnosis not present

## 2022-02-05 DIAGNOSIS — E119 Type 2 diabetes mellitus without complications: Secondary | ICD-10-CM | POA: Insufficient documentation

## 2022-02-05 DIAGNOSIS — F418 Other specified anxiety disorders: Secondary | ICD-10-CM | POA: Insufficient documentation

## 2022-02-05 DIAGNOSIS — Z9889 Other specified postprocedural states: Secondary | ICD-10-CM

## 2022-02-05 HISTORY — DX: Hypothyroidism, unspecified: E03.9

## 2022-02-05 HISTORY — PX: DILATATION & CURETTAGE/HYSTEROSCOPY WITH MYOSURE: SHX6511

## 2022-02-05 LAB — CBC
HCT: 33.6 % — ABNORMAL LOW (ref 36.0–46.0)
Hemoglobin: 11.1 g/dL — ABNORMAL LOW (ref 12.0–15.0)
MCH: 31.4 pg (ref 26.0–34.0)
MCHC: 33 g/dL (ref 30.0–36.0)
MCV: 95.2 fL (ref 80.0–100.0)
Platelets: 274 10*3/uL (ref 150–400)
RBC: 3.53 MIL/uL — ABNORMAL LOW (ref 3.87–5.11)
RDW: 13.6 % (ref 11.5–15.5)
WBC: 4.3 10*3/uL (ref 4.0–10.5)
nRBC: 0 % (ref 0.0–0.2)

## 2022-02-05 LAB — BASIC METABOLIC PANEL
Anion gap: 7 (ref 5–15)
BUN: 25 mg/dL — ABNORMAL HIGH (ref 8–23)
CO2: 28 mmol/L (ref 22–32)
Calcium: 10.1 mg/dL (ref 8.9–10.3)
Chloride: 105 mmol/L (ref 98–111)
Creatinine, Ser: 0.91 mg/dL (ref 0.44–1.00)
GFR, Estimated: >60 mL/min (ref >60)
Glucose, Bld: 91 mg/dL (ref 70–99)
Potassium: 3.5 mmol/L (ref 3.5–5.1)
Sodium: 140 mmol/L (ref 135–145)

## 2022-02-05 LAB — GLUCOSE, CAPILLARY
Glucose-Capillary: 84 mg/dL (ref 70–99)
Glucose-Capillary: 76 mg/dL (ref 70–99)

## 2022-02-05 SURGERY — DILATATION & CURETTAGE/HYSTEROSCOPY WITH MYOSURE
Anesthesia: General | Site: Vagina

## 2022-02-05 MED ORDER — BUPIVACAINE HCL (PF) 0.25 % IJ SOLN
INTRAMUSCULAR | Status: DC | PRN
  Administered 2022-02-05: 10 mL

## 2022-02-05 MED ORDER — FENTANYL CITRATE (PF) 100 MCG/2ML IJ SOLN
INTRAMUSCULAR | Status: AC
  Filled 2022-02-05: qty 2

## 2022-02-05 MED ORDER — DEXAMETHASONE SODIUM PHOSPHATE 10 MG/ML IJ SOLN
INTRAMUSCULAR | Status: AC
  Filled 2022-02-05: qty 1

## 2022-02-05 MED ORDER — ACETAMINOPHEN 500 MG PO TABS
1000.0000 mg | ORAL_TABLET | ORAL | Status: AC
  Administered 2022-02-05: 1000 mg via ORAL

## 2022-02-05 MED ORDER — LIDOCAINE HCL (CARDIAC) PF 100 MG/5ML IV SOSY
PREFILLED_SYRINGE | INTRAVENOUS | Status: DC | PRN
  Administered 2022-02-05: 60 mg via INTRAVENOUS

## 2022-02-05 MED ORDER — FENTANYL CITRATE (PF) 100 MCG/2ML IJ SOLN
INTRAMUSCULAR | Status: DC | PRN
  Administered 2022-02-05 (×4): 25 ug via INTRAVENOUS

## 2022-02-05 MED ORDER — GLYCOPYRROLATE 0.2 MG/ML IJ SOLN
INTRAMUSCULAR | Status: DC | PRN
  Administered 2022-02-05: .2 mg via INTRAVENOUS

## 2022-02-05 MED ORDER — ONDANSETRON HCL 4 MG/2ML IJ SOLN: 4.0000 mg | Freq: Once | INTRAMUSCULAR | Status: DC | PRN

## 2022-02-05 MED ORDER — IBUPROFEN 600 MG PO TABS: 600.0000 mg | ORAL_TABLET | Freq: Four times a day (QID) | ORAL | 0 refills | Status: DC | PRN

## 2022-02-05 MED ORDER — PROPOFOL 10 MG/ML IV BOLUS
INTRAVENOUS | Status: AC
  Filled 2022-02-05: qty 20

## 2022-02-05 MED ORDER — PROPOFOL 10 MG/ML IV BOLUS
INTRAVENOUS | Status: DC | PRN
  Administered 2022-02-05: 120 mg via INTRAVENOUS
  Administered 2022-02-05: 50 mg via INTRAVENOUS

## 2022-02-05 MED ORDER — PHENYLEPHRINE HCL (PRESSORS) 10 MG/ML IV SOLN
INTRAVENOUS | Status: DC | PRN
  Administered 2022-02-05 (×3): 80 ug via INTRAVENOUS

## 2022-02-05 MED ORDER — LIDOCAINE HCL (PF) 2 % IJ SOLN
INTRAMUSCULAR | Status: AC
  Filled 2022-02-05: qty 5

## 2022-02-05 MED ORDER — SODIUM CHLORIDE 0.9 % IR SOLN
Status: DC | PRN
  Administered 2022-02-05: 3000 mL

## 2022-02-05 MED ORDER — POVIDONE-IODINE 10 % EX SWAB: 2.0000 | Freq: Once | CUTANEOUS | Status: DC

## 2022-02-05 MED ORDER — EPHEDRINE 5 MG/ML INJ
INTRAVENOUS | Status: AC
  Filled 2022-02-05: qty 5

## 2022-02-05 MED ORDER — EPHEDRINE SULFATE (PRESSORS) 50 MG/ML IJ SOLN
INTRAMUSCULAR | Status: DC | PRN
  Administered 2022-02-05: 10 mg via INTRAVENOUS

## 2022-02-05 MED ORDER — SODIUM CHLORIDE 0.9 % IV SOLN: INTRAVENOUS | Status: DC

## 2022-02-05 MED ORDER — LACTATED RINGERS IV SOLN: INTRAVENOUS | Status: DC

## 2022-02-05 MED ORDER — PHENYLEPHRINE 80 MCG/ML (10ML) SYRINGE FOR IV PUSH (FOR BLOOD PRESSURE SUPPORT)
PREFILLED_SYRINGE | INTRAVENOUS | Status: AC
  Filled 2022-02-05: qty 10

## 2022-02-05 MED ORDER — FENTANYL CITRATE (PF) 100 MCG/2ML IJ SOLN: 25.0000 ug | INTRAMUSCULAR | Status: DC | PRN

## 2022-02-05 MED ORDER — ONDANSETRON HCL 4 MG/2ML IJ SOLN
INTRAMUSCULAR | Status: AC
  Filled 2022-02-05: qty 2

## 2022-02-05 MED ORDER — ACETAMINOPHEN 500 MG PO TABS
ORAL_TABLET | ORAL | Status: AC
  Filled 2022-02-05: qty 2

## 2022-02-05 MED ORDER — ONDANSETRON HCL 4 MG/2ML IJ SOLN
INTRAMUSCULAR | Status: DC | PRN
  Administered 2022-02-05: 4 mg via INTRAVENOUS

## 2022-02-05 MED ORDER — DEXAMETHASONE SODIUM PHOSPHATE 4 MG/ML IJ SOLN
INTRAMUSCULAR | Status: DC | PRN
  Administered 2022-02-05: 5 mg via INTRAVENOUS

## 2022-02-05 MED ORDER — HYDROCODONE-ACETAMINOPHEN 5-325 MG PO TABS: 1.0000 | ORAL_TABLET | Freq: Four times a day (QID) | ORAL | 0 refills | Status: DC | PRN

## 2022-02-05 SURGICAL SUPPLY — 18 items
CATH ROBINSON RED A/P 16FR (CATHETERS) IMPLANT
DEVICE MYOSURE LITE (MISCELLANEOUS) IMPLANT
DEVICE MYOSURE REACH (MISCELLANEOUS) IMPLANT
DRSG TELFA 3X8 NADH STRL (GAUZE/BANDAGES/DRESSINGS) ×1 IMPLANT
GAUZE 4X4 16PLY ~~LOC~~+RFID DBL (SPONGE) ×1 IMPLANT
GLOVE BIOGEL M 6.5 STRL (GLOVE) ×1 IMPLANT
GLOVE BIOGEL M 7.0 STRL (GLOVE) ×1 IMPLANT
GLOVE BIOGEL PI IND STRL 6.5 (GLOVE) ×2 IMPLANT
GLOVE BIOGEL PI IND STRL 7.0 (GLOVE) ×1 IMPLANT
GOWN STRL REUS W/ TWL LRG LVL3 (GOWN DISPOSABLE) IMPLANT
GOWN STRL REUS W/TWL LRG LVL3 (GOWN DISPOSABLE) ×3 IMPLANT
KIT PROCEDURE FLUENT (KITS) ×1 IMPLANT
KIT TURNOVER CYSTO (KITS) ×1 IMPLANT
PACK VAGINAL MINOR WOMEN LF (CUSTOM PROCEDURE TRAY) ×1 IMPLANT
PAD OB MATERNITY 4.3X12.25 (PERSONAL CARE ITEMS) ×1 IMPLANT
SEAL ROD LENS SCOPE MYOSURE (ABLATOR) ×1 IMPLANT
SPIKE FLUID TRANSFER (MISCELLANEOUS) IMPLANT
TOWEL OR 17X24 6PK STRL BLUE (TOWEL DISPOSABLE) IMPLANT

## 2022-02-05 NOTE — Op Note (Signed)
02/05/2022  3:42 PM  PATIENT:  Lauren Garrett  76 y.o. female  PRE-OPERATIVE DIAGNOSIS:  Endometrial Thickening on Ultrasound  POST-OPERATIVE DIAGNOSIS:  Endometrial Thickening on Ultrasound  PROCEDURE:  Procedure(s): DILATATION & CURETTAGE/HYSTEROSCOPY WITH MYOSURE (N/A)  SURGEON:  Surgeon(s) and Role:    Christophe Louis, MD - Primary  PHYSICIAN ASSISTANT:   ASSISTANTS: none   ANESTHESIA:   general  EBL:  10 mL   BLOOD ADMINISTERED:none  DRAINS: none   LOCAL MEDICATIONS USED:  MARCAINE     SPECIMEN:  Source of Specimen:  endometrial polyps and curettings   DISPOSITION OF SPECIMEN:  PATHOLOGY  COUNTS:  YES  TOURNIQUET:  * No tourniquets in log *  DICTATION: .Note written in EPIC  PLAN OF CARE: Discharge to home after PACU  PATIENT DISPOSITION:  PACU - hemodynamically stable.   Delay start of Pharmacological VTE agent (>24hrs) due to surgical blood loss or risk of bleeding: not applicable  Findings: normal vaginal mucosa , normal appearing cervix , 2 endometrial polyps noted. Atrophic appearing surrounding endometrium.   Procedure: Patient was taken to the operating room #4 [facility] where she was placed under general anesthesia. She was placed in the dorsal lithotomy position prior to the administration of anesthesia due to chronic back pain.  She was prepped and draped in the usual sterile fashion. A speculum was placed into the vaginal vault. The anterior lip of the cervix was grasped with a single-tooth tenaculum. Quarter percent Marcaine was injected at the 4 and 8:00 positions of the cervix. The cervix was then sounded to 6 cm. The cervix was dilated to approximately 6 mm. Mysosure operative  hysteroscope was inserted. The findings noted above. Myosure reach  blade was introduced through the hysteroscope. The endometrial mass was removed in 5 minute.  There was no evidence of perforation. .The hysteroscope was removed.  The single-tooth tenaculum was removed from the  anterior lip of the cervix.  The speculum was removed from the patient's vagina. She was awakened from anesthesia taken care  To the recovery  room awake and in stable condition. Sponge lap and needle counts were correct x2.

## 2022-02-05 NOTE — Transfer of Care (Signed)
Immediate Anesthesia Transfer of Care Note  Patient: Lauren Garrett  Procedure(s) Performed: Procedure(s) (LRB): DILATATION & CURETTAGE/HYSTEROSCOPY WITH MYOSURE (N/A)  Patient Location: PACU  Anesthesia Type: General  Level of Consciousness: awake, sedated, patient cooperative and responds to stimulation  Airway & Oxygen Therapy: Patient Spontanous Breathing and Patient connected to [place] oxygen  Post-op Assessment: Report given to PACU RN, Post -op Vital signs reviewed and stable and Patient moving all extremities  Post vital signs: Reviewed and stable  Complications: No apparent anesthesia complications

## 2022-02-05 NOTE — H&P (Signed)
Date of Initial H&P: 02/03/2022  History reviewed, patient examined, no change in status, stable for surgery.

## 2022-02-05 NOTE — Discharge Instructions (Signed)
     No acetaminophen/Tylenol until after 7:50 pm today if needed.      Post Anesthesia Home Care Instructions  Activity: Get plenty of rest for the remainder of the day. A responsible individual must stay with you for 24 hours following the procedure.  For the next 24 hours, DO NOT: -Drive a car -Paediatric nurse -Drink alcoholic beverages -Take any medication unless instructed by your physician -Make any legal decisions or sign important papers.  Meals: Start with liquid foods such as gelatin or soup. Progress to regular foods as tolerated. Avoid greasy, spicy, heavy foods. If nausea and/or vomiting occur, drink only clear liquids until the nausea and/or vomiting subsides. Call your physician if vomiting continues.  Special Instructions/Symptoms: Your throat may feel dry or sore from the anesthesia or the breathing tube placed in your throat during surgery. If this causes discomfort, gargle with warm salt water. The discomfort should disappear within 24 hours.

## 2022-02-05 NOTE — Anesthesia Preprocedure Evaluation (Addendum)
Anesthesia Evaluation  Patient identified by MRN, date of birth, ID band Patient awake    Reviewed: Allergy & Precautions, NPO status , Patient's Chart, lab work & pertinent test results  Airway Mallampati: II  TM Distance: >3 FB Neck ROM: Full    Dental  (+) Teeth Intact, Dental Advisory Given, Implants,    Pulmonary neg pulmonary ROS,    Pulmonary exam normal breath sounds clear to auscultation       Cardiovascular hypertension, Pt. on medications Normal cardiovascular exam Rhythm:Regular Rate:Normal     Neuro/Psych  Headaches, PSYCHIATRIC DISORDERS Anxiety Depression  Neuromuscular disease    GI/Hepatic negative GI ROS, Neg liver ROS,   Endo/Other  diabetes, Type 2Hypothyroidism   Renal/GU Renal InsufficiencyRenal disease     Musculoskeletal  (+) Arthritis ,   Abdominal   Peds  Hematology negative hematology ROS (+)   Anesthesia Other Findings Day of surgery medications reviewed with the patient.  Reproductive/Obstetrics Endometrial Thickening on Ultrasound                            Anesthesia Physical Anesthesia Plan  ASA: 2  Anesthesia Plan: General   Post-op Pain Management: Tylenol PO (pre-op)*   Induction: Intravenous  PONV Risk Score and Plan: 4 or greater and Dexamethasone, Ondansetron and Treatment may vary due to age or medical condition  Airway Management Planned: LMA  Additional Equipment:   Intra-op Plan:   Post-operative Plan: Extubation in OR  Informed Consent: I have reviewed the patients History and Physical, chart, labs and discussed the procedure including the risks, benefits and alternatives for the proposed anesthesia with the patient or authorized representative who has indicated his/her understanding and acceptance.     Dental advisory given  Plan Discussed with: CRNA  Anesthesia Plan Comments:         Anesthesia Quick Evaluation

## 2022-02-05 NOTE — Anesthesia Procedure Notes (Signed)
Procedure Name: LMA Insertion Date/Time: 02/05/2022 2:47 PM  Performed by: Justice Rocher, CRNAPre-anesthesia Checklist: Patient identified, Emergency Drugs available, Suction available, Patient being monitored and Timeout performed Patient Re-evaluated:Patient Re-evaluated prior to induction Oxygen Delivery Method: Circle system utilized Preoxygenation: Pre-oxygenation with 100% oxygen Induction Type: IV induction Ventilation: Mask ventilation without difficulty LMA: LMA inserted LMA Size: 4.0 Number of attempts: 1 Airway Equipment and Method: Bite block Placement Confirmation: positive ETCO2, breath sounds checked- equal and bilateral and CO2 detector Tube secured with: Tape Dental Injury: Teeth and Oropharynx as per pre-operative assessment

## 2022-02-06 ENCOUNTER — Encounter (HOSPITAL_BASED_OUTPATIENT_CLINIC_OR_DEPARTMENT_OTHER): Payer: Self-pay | Admitting: Obstetrics and Gynecology

## 2022-02-06 NOTE — Anesthesia Postprocedure Evaluation (Signed)
Anesthesia Post Note  Patient: Lauren Garrett  Procedure(s) Performed: DILATATION & CURETTAGE/HYSTEROSCOPY WITH MYOSURE (Vagina )     Patient location during evaluation: PACU Anesthesia Type: General Level of consciousness: awake and alert Pain management: pain level controlled Vital Signs Assessment: post-procedure vital signs reviewed and stable Respiratory status: spontaneous breathing, nonlabored ventilation, respiratory function stable and patient connected to nasal cannula oxygen Cardiovascular status: blood pressure returned to baseline and stable Postop Assessment: no apparent nausea or vomiting Anesthetic complications: no   No notable events documented.  Last Vitals:  Vitals:   02/05/22 1630 02/05/22 1730  BP: (!) 147/69 (!) 149/79  Pulse: 81 (!) 103  Resp: 10 14  Temp:  36.4 C  SpO2: 100% 100%    Last Pain:  Vitals:   02/06/22 0943  TempSrc:   PainSc: 0-No pain                 Santa Lighter

## 2022-02-07 LAB — SURGICAL PATHOLOGY

## 2022-02-12 ENCOUNTER — Telehealth: Payer: Self-pay | Admitting: Neurology

## 2022-02-12 NOTE — Telephone Encounter (Signed)
Pt daughter iss calling. Requesting a nurse give her a call about a sooner appointment. Stated Pt is having a lot more issues.

## 2022-02-12 NOTE — Telephone Encounter (Signed)
Contacted pt daughter back, she stated pt is having issues comprehending things, more frustration and confusion since last visit. She has declined significantly with her mood swings, agitation, outburst and confusion. This has always been a issue for the family but it isn't able to be said in [place] due to pt getting upset, cutting family off  and disagreeing with what is said. This is getting harder for patient husband to care for her and himself as well as they are both up in age. Pt daughter didn't think it would be a good idea for here to see another provider/NP due to pt not being cooperative. Advised I will place pt on wait list with Dr Leonie Man if something comes available wit him sooner than current visit scheduled 04/01/22. Advised if things do worsen and she cannot wait, please CB to schedule with another provider. She verbally understood and was appreciative for the CB.

## 2022-02-17 DIAGNOSIS — H40023 Open angle with borderline findings, high risk, bilateral: Secondary | ICD-10-CM | POA: Diagnosis not present

## 2022-03-26 DIAGNOSIS — H40023 Open angle with borderline findings, high risk, bilateral: Secondary | ICD-10-CM | POA: Diagnosis not present

## 2022-04-02 ENCOUNTER — Encounter: Payer: Self-pay | Admitting: Neurology

## 2022-04-02 ENCOUNTER — Ambulatory Visit: Payer: Medicare PPO | Admitting: Neurology

## 2022-04-02 VITALS — BP 179/84 | HR 84 | Ht 68.0 in | Wt 138.0 lb

## 2022-04-02 DIAGNOSIS — R413 Other amnesia: Secondary | ICD-10-CM

## 2022-04-02 DIAGNOSIS — R269 Unspecified abnormalities of gait and mobility: Secondary | ICD-10-CM

## 2022-04-02 MED ORDER — DONEPEZIL HCL 10 MG PO TABS: 10.0000 mg | ORAL_TABLET | Freq: Every day | ORAL | 3 refills | Status: DC
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
## 2022-04-02 NOTE — Patient Instructions (Addendum)
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    If she cannot tolerate Aricept or has decline may consider Lequembi in the future I encouraged her to increase participation in cognitively challenging activities like solving crossword puzzles, playing bridge and sudoku.  We also discussed memory compensation strategies.  She was advised to follow-up with Dr. Cyndy Freeze for her back surgery she will return for follow-up with me in 3 months or call earlier if necessary.  ARICEPT (Donepezil )Tablets What is this medication? DONEPEZIL (doe NEP e zil) treats memory loss and confusion (dementia) in people who have Alzheimer disease. It works by improving attention, memory, and the ability to engage in daily activities. It is not a cure for dementia or Alzheimer disease. This medicine may be used for other purposes; ask your health care provider or pharmacist if you have questions. COMMON BRAND NAME(S): Aricept What should I tell my care team before I take this medication? They need to know if you have any of these conditions: Asthma or other lung disease Difficulty passing urine Head injury Heart disease History of irregular heartbeat Liver disease Seizures (convulsions) Stomach or intestinal disease, ulcers or stomach bleeding An unusual or allergic reaction to donepezil, other medications, foods, dyes, or preservatives Pregnant or trying to get pregnant Breast-feeding How should I use this medication? Take this medication by mouth with a glass of water. Follow the directions on the prescription label. You may take this medication with or without  food. Take this medication at regular intervals. This medication is usually taken before bedtime. Do not take it more often than directed. Continue to take your medication even if you feel better. Do not stop taking except on your care team's advice. If you are taking the 23 mg donepezil tablet, swallow it whole; do not cut, crush, or chew it. Talk to your care team about the use of this medication in children. Special care may be needed. Overdosage: If you think you have taken too much of this medicine contact a poison control center or emergency room at once. NOTE: This medicine is only for you. Do not share this medicine with others. What if I miss a dose? If you miss a dose, take it as soon as you can. If it is almost time for your next dose, take only that dose, do not take double or extra doses. What may interact with this medication? Do not take this medication with any of the following: Certain medications for fungal infections like itraconazole, fluconazole, posaconazole, and voriconazole Cisapride Dextromethorphan; quinidine Dronedarone Pimozide Quinidine Thioridazine This medication may also interact with the following: Antihistamines for allergy, cough and cold Atropine Bethanechol Carbamazepine Certain medications for bladder problems like oxybutynin, tolterodine Certain medications for Parkinson's disease like benztropine, trihexyphenidyl Certain medications for stomach problems like dicyclomine, hyoscyamine Certain medications for travel sickness like scopolamine Dexamethasone Dofetilide Ipratropium NSAIDs, medications for pain and inflammation, like ibuprofen or naproxen Other medications for Alzheimer's disease Other medications that prolong the QT interval (cause an abnormal heart rhythm) Phenobarbital Phenytoin Rifampin, rifabutin or rifapentine Ziprasidone This list may not describe all possible interactions.  Give your health care provider a list of all the  medicines, herbs, non-prescription drugs, or dietary supplements you use. Also tell them if you smoke, drink alcohol, or use illegal drugs. Some items may interact with your medicine. What should I watch for while using this medication? Visit your care team for regular checks on your progress. Check with your care team if your symptoms do not get better or if they get worse. You may get drowsy or dizzy. Do not drive, use machinery, or do anything that needs mental alertness until you know how this medication affects you. What side effects may I notice from receiving this medication? Side effects that you should report to your care team as soon as possible: Allergic reactions--skin rash, itching, hives, swelling of the face, lips, tongue, or throat Peptic ulcer--burning stomach pain, loss of appetite, bloating, burping, heartburn, nausea, vomiting Seizures Slow heartbeat--dizziness, feeling faint or lightheaded, confusion, trouble breathing, unusual weakness or fatigue Stomach bleeding--bloody or black, tar-like stools, vomiting blood or brown material that looks like coffee grounds Trouble passing urine Side effects that usually do not require medical attention (report these to your care team if they continue or are bothersome): Diarrhea Fatigue Loss of appetite Muscle pain or cramps Nausea Trouble sleeping This list may not describe all possible side effects. Call your doctor for medical advice about side effects. You may report side effects to FDA at 1-800-FDA-1088. Where should I keep my medication? Keep out of reach of children. Store at room temperature between 15 and 30 degrees C (59 and 86 degrees F). Throw away any unused medication after the expiration date. NOTE: This sheet is a summary. It may not cover all possible information. If you have questions about this medicine, talk to your doctor, pharmacist, or health care provider.  2023 Elsevier/Gold Standard (2020-10-25  00:00:00)

## 2022-04-16 ENCOUNTER — Ambulatory Visit: Payer: Medicare PPO | Admitting: Orthopaedic Surgery

## 2022-05-22 ENCOUNTER — Ambulatory Visit: Payer: Medicare PPO | Admitting: Physician Assistant

## 2022-05-22 ENCOUNTER — Encounter: Payer: Self-pay | Admitting: Physician Assistant

## 2022-05-22 ENCOUNTER — Ambulatory Visit (INDEPENDENT_AMBULATORY_CARE_PROVIDER_SITE_OTHER): Payer: Medicare PPO

## 2022-05-22 DIAGNOSIS — S3992XA Unspecified injury of lower back, initial encounter: Secondary | ICD-10-CM | POA: Diagnosis not present

## 2022-05-22 DIAGNOSIS — M170 Bilateral primary osteoarthritis of knee: Secondary | ICD-10-CM

## 2022-05-22 MED ORDER — LIDOCAINE HCL 1 % IJ SOLN
2.0000 mL | INTRAMUSCULAR | Status: AC | PRN
  Administered 2022-05-22: 2 mL

## 2022-05-22 MED ORDER — METHYLPREDNISOLONE ACETATE 40 MG/ML IJ SUSP
60.0000 mg | INTRAMUSCULAR | Status: AC | PRN
  Administered 2022-05-22: 60 mg via INTRA_ARTICULAR

## 2022-05-22 MED ORDER — BUPIVACAINE HCL 0.25 % IJ SOLN
2.0000 mL | INTRAMUSCULAR | Status: AC | PRN
  Administered 2022-05-22: 2 mL via INTRA_ARTICULAR

## 2022-05-22 NOTE — Progress Notes (Addendum)
Office Visit Note   Patient: Lauren Garrett           Date of Birth: 1945/07/05           MRN: [id number] Visit Date: 05/22/2022              Requested by: Haywood Pao, MD [address] [place],   [id number] PCP: Osborne Casco Fransico Him, MD  Chief Complaint  Patient presents with  . Pelvis - Pain      HPI: Ms. Satira Mccallum comes in today with her husband.  She is complaining of lower buttock pain after a fall.  She was stepping up and slipped on grass and fell backward onto her tailbone.  This happened approximately a week ago.  She says she has been in bed since.  Denies any loss of bowel or bladder control.  Also requesting injections into her knees.  She is followed by Dr. Elizabeth Sauer for her back and states she has upcoming surgery.  Assessment & Plan: Visit Diagnoses:  1. Injury of coccyx, initial encounter     Plan: X-rays are difficult to interpret however she is neurologically intact.  She has great strength.  She does have bilateral straight leg raise greater on the left which reproduces the pain across the bottom of her back.  Talked about symptomatic treatment for her back including continued treatment with Salonpas.  Voltaren gel.  I encouraged them to follow-up with her back doctor and they are going to do this.  Understands if she has any progression of symptoms such as loss of bowel or bladder control she is to be seen immediately in an emergency care center.  With regards to her knees I went forward and inject with them for her today  Follow-Up Instructions: With Dr. Maris Berger Exam  Patient is alert, oriented, no adenopathy, well-dressed, normal affect, normal respiratory effort. Examination of her lower back there is no bruising no erythema no redness.  She is tender to palpation across the sacrum.  She does have a positive straight leg raise bilaterally which reproduces pain in her back.  She has equivalent strength bilaterally which is 5 out of 5 with  resisted dorsiflexion and plantarflexion of her ankles extension and flexion of her legs sensation is at her baseline she does have some baseline sensation changes in her feet.  Compartments are soft and nontender negative Homans' sign  Imaging: XR Sacrum/Coccyx  Result Date: 05/22/2022 Radiographs of her sacrum were taken today.  She does have degenerative changes.  Cannot identify an acute fracture however very difficult to interpret during 2 presence of bowel  No images are attached to the encounter.  Labs: Lab Results  Component Value Date   ESRSEDRATE 37 10/01/2021   ESRSEDRATE 14 02/02/2013     No results found for: "ALBUMIN", "PREALBUMIN", "CBC"  No results found for: "MG" Lab Results  Component Value Date   VD25OH 54.9 10/01/2021    No results found for: "PREALBUMIN"    Latest Ref Rng & Units 02/05/2022    1:35 PM 10/19/2019    3:37 PM 09/17/2015    7:03 PM  CBC EXTENDED  WBC 4.0 - 10.5 K/uL 4.3  4.6  5.7   RBC 3.87 - 5.11 MIL/uL 3.53  3.61  3.81   Hemoglobin 12.0 - 15.0 g/dL 11.1  11.3  11.9   HCT 36.0 - 46.0 % 33.6  35.5  35.9   Platelets 150 - 400 K/uL 274  358  252  NEUT# 1.7 - 7.7 K/uL   2.5   Lymph# 0.7 - 4.0 K/uL   2.4      There is no height or weight on file to calculate BMI.  Orders:  Orders Placed This Encounter  Procedures  . XR Sacrum/Coccyx   No orders of the defined types were placed in this encounter.    Procedures: Large Joint Inj: bilateral knee on 05/22/2022 11:45 AM Indications: pain and diagnostic evaluation Details: 25 G 1.5 in needle, anteromedial approach  Arthrogram: No  Medications (Right): 2 mL lidocaine 1 %; 2 mL bupivacaine 0.25 %; 60 mg methylPREDNISolone acetate 40 MG/ML Medications (Left): 2 mL lidocaine 1 %; 2 mL bupivacaine 0.25 %; 60 mg methylPREDNISolone acetate 40 MG/ML Outcome: tolerated well, no immediate complications Procedure, treatment alternatives, risks and benefits explained, specific risks discussed.  Consent was given by the patient.    Clinical Data: No additional findings.  ROS:  All other systems negative, except as noted in the HPI. Review of Systems  Objective: Vital Signs: There were no vitals taken for this visit.  Specialty Comments:  No specialty comments available.  PMFS History: Patient Active Problem List   Diagnosis Date Noted  . Bilateral primary osteoarthritis of knee 05/31/2020  . Pain in left ankle and joints of left foot 01/17/2020  . Elevated blood pressure reading in office without diagnosis of hypertension 06/09/2017  . 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 Dizziness and giddiness 09/27/2015  . Tension headache 03/23/2013  . Disturbance of skin sensation 02/02/2013  . H/O cervical spine surgery 02/02/2013  . Degeneration of lumbar or lumbosacral intervertebral disc 02/02/2013  . Generalized anxiety disorder 02/02/2013  . Hypercholesteremia    Past Medical History:  Diagnosis Date  . Anemia   . Arthritis   . Cerebrovascular disease   . Chronic kidney disease (CKD), stage III (moderate) (HCC)   . Chronic pain   . Depression   . DJD (degenerative joint disease)   . DM (diabetes mellitus) ([place])   . Hair loss   . Headache   . Heme positive stool   . Hirsutism   . Hypercholesteremia   . Hyperlipidemia   . Hypertension   . Hypothyroidism   . IBS (irritable bowel syndrome)   . 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 Peripheral neuropathy   . Preglaucoma   . Spinal stenosis   . Spinal stenosis   . Thyroid disease     Family History  Problem Relation Age of Onset  . Dementia Mother   . Lung cancer Father   . Migraines Daughter   . Breast cancer Neg Hx     Past Surgical History:  Procedure Laterality Date  . DILATATION & CURETTAGE/HYSTEROSCOPY WITH MYOSURE N/A 02/05/2022   Procedure: DILATATION & CURETTAGE/HYSTEROSCOPY WITH MYOSURE;  Surgeon: Christophe Louis, MD;  [facility]  Service: Gynecology;  Laterality: N/A;  . NECK  SURGERY     [phone]   Social History   Occupational History  . Occupation: Rtired     Fish farm manager: RETIRED  Tobacco Use  . Smoking status: Never  . Smokeless tobacco: Never  Substance and Sexual Activity  . Alcohol use: No  . Drug use: No  . Sexual activity: Not Currently

## 2022-05-27 DIAGNOSIS — L680 Hirsutism: Secondary | ICD-10-CM | POA: Diagnosis not present

## 2022-06-30 DIAGNOSIS — H40023 Open angle with borderline findings, high risk, bilateral: Secondary | ICD-10-CM | POA: Diagnosis not present

## 2022-06-30 DIAGNOSIS — H04123 Dry eye syndrome of bilateral lacrimal glands: Secondary | ICD-10-CM | POA: Diagnosis not present

## 2022-06-30 DIAGNOSIS — H35373 Puckering of macula, bilateral: Secondary | ICD-10-CM | POA: Diagnosis not present

## 2022-06-30 DIAGNOSIS — H26493 Other secondary cataract, bilateral: Secondary | ICD-10-CM | POA: Diagnosis not present

## 2022-07-04 DIAGNOSIS — D631 Anemia in chronic kidney disease: Secondary | ICD-10-CM | POA: Diagnosis not present

## 2022-07-04 DIAGNOSIS — G629 Polyneuropathy, unspecified: Secondary | ICD-10-CM | POA: Diagnosis not present

## 2022-07-04 DIAGNOSIS — E1129 Type 2 diabetes mellitus with other diabetic kidney complication: Secondary | ICD-10-CM | POA: Diagnosis not present

## 2022-07-04 DIAGNOSIS — E1149 Type 2 diabetes mellitus with other diabetic neurological complication: Secondary | ICD-10-CM | POA: Diagnosis not present

## 2022-07-04 DIAGNOSIS — R195 Other fecal abnormalities: Secondary | ICD-10-CM | POA: Diagnosis not present

## 2022-07-04 DIAGNOSIS — E039 Hypothyroidism, unspecified: Secondary | ICD-10-CM | POA: Diagnosis not present

## 2022-07-04 DIAGNOSIS — N1831 Chronic kidney disease, stage 3a: Secondary | ICD-10-CM | POA: Diagnosis not present

## 2022-07-04 DIAGNOSIS — I129 Hypertensive chronic kidney disease with stage 1 through stage 4 chronic kidney disease, or unspecified chronic kidney disease: Secondary | ICD-10-CM | POA: Diagnosis not present

## 2022-07-09 ENCOUNTER — Ambulatory Visit (INDEPENDENT_AMBULATORY_CARE_PROVIDER_SITE_OTHER): Payer: Medicare PPO | Admitting: Physician Assistant

## 2022-07-09 ENCOUNTER — Encounter: Payer: Self-pay | Admitting: Physician Assistant

## 2022-07-09 DIAGNOSIS — M170 Bilateral primary osteoarthritis of knee: Secondary | ICD-10-CM | POA: Diagnosis not present

## 2022-07-09 MED ORDER — LIDOCAINE HCL 1 % IJ SOLN
2.0000 mL | INTRAMUSCULAR | Status: AC | PRN
  Administered 2022-07-09: 2 mL

## 2022-07-09 MED ORDER — BUPIVACAINE HCL 0.25 % IJ SOLN
2.0000 mL | INTRAMUSCULAR | Status: AC | PRN
  Administered 2022-07-09: 2 mL via INTRA_ARTICULAR

## 2022-07-09 MED ORDER — METHYLPREDNISOLONE ACETATE 40 MG/ML IJ SUSP
80.0000 mg | INTRAMUSCULAR | Status: AC | PRN
  Administered 2022-07-09: 80 mg via INTRA_ARTICULAR

## 2022-07-09 NOTE — Progress Notes (Signed)
Office Visit Note   Patient: Lauren Garrett           Date of Birth: [birth year]           MRN: [id number] Visit Date: 07/09/2022              Requested by: Haywood Pao, MD [address] [place],  [place] [address] PCP: Osborne Casco Fransico Him, MD  Chief Complaint  Patient presents with  . Right Knee - Pain  . Left Knee - Pain      HPI: Lauren Garrett is a pleasant 77 year old woman who is a patient of Dr. Rudene Anda.  She comes in periodically for knee injections.  Last ones were in September.  She has had no new injury she is requesting injections into both of her knees today  Assessment & Plan: Visit Diagnoses: Osteoarthritis bilateral knees  Plan: Went forward with injections today.  She is planning on some back surgery but did clear this with her back surgeon.  From her knee standpoint she inquired about knee replacement I would refer her to Dr. Ninfa Linden if this is her desire  Follow-Up Instructions: As needed  Ortho Exam  Patient is alert, oriented, no adenopathy, well-dressed, normal affect, normal respiratory effort. Bilateral knees she has good flexion extension no effusion no erythema no redness.  Compartments are soft and nontender  Imaging: No results found. No images are attached to the encounter.  Labs: Lab Results  Component Value Date   ESRSEDRATE 37 10/01/2021   ESRSEDRATE 14 02/02/2013     No results found for: "ALBUMIN", "PREALBUMIN", "CBC"  No results found for: "MG" Lab Results  Component Value Date   VD25OH 54.9 10/01/2021    No results found for: "PREALBUMIN"    Latest Ref Rng & Units 02/05/2022    1:35 PM 10/19/2019    3:37 PM 09/17/2015    7:03 PM  CBC EXTENDED  WBC 4.0 - 10.5 K/uL 4.3  4.6  5.7   RBC 3.87 - 5.11 MIL/uL 3.53  3.61  3.81   Hemoglobin 12.0 - 15.0 g/dL 11.1  11.3  11.9   HCT 36.0 - 46.0 % 33.6  35.5  35.9   Platelets 150 - 400 K/uL 274  358  252   NEUT# 1.7 - 7.7 K/uL   2.5   Lymph# 0.7 - 4.0 K/uL   2.4       There is no height or weight on file to calculate BMI.  Orders:  No orders of the defined types were placed in this encounter.  No orders of the defined types were placed in this encounter.    Procedures: Large Joint Inj: bilateral knee on 07/09/2022 2:27 PM Indications: pain and diagnostic evaluation Details: 25 G 1.5 in needle, anteromedial approach  Arthrogram: No  Medications (Right): 2 mL lidocaine 1 %; 2 mL bupivacaine 0.25 %; 80 mg methylPREDNISolone acetate 40 MG/ML Medications (Left): 2 mL lidocaine 1 %; 2 mL bupivacaine 0.25 %; 80 mg methylPREDNISolone acetate 40 MG/ML Outcome: tolerated well, no immediate complications Procedure, treatment alternatives, risks and benefits explained, specific risks discussed. Consent was given by the patient.    Clinical Data: No additional findings.  ROS:  All other systems negative, except as noted in the HPI. Review of Systems  Objective: Vital Signs: There were no vitals taken for this visit.  Specialty Comments:  No specialty comments available.  PMFS History: Patient Active Problem List   Diagnosis Date Noted  . Bilateral primary osteoarthritis  of knee 05/31/2020  . Pain in left ankle and joints of left foot 01/17/2020  . Elevated blood pressure reading in office without diagnosis of hypertension 06/09/2017  . 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 Dizziness and giddiness 09/27/2015  . Tension headache 03/23/2013  . Disturbance of skin sensation 02/02/2013  . H/O cervical spine surgery 02/02/2013  . Degeneration of lumbar or lumbosacral intervertebral disc 02/02/2013  . Generalized anxiety disorder 02/02/2013  . Hypercholesteremia    Past Medical History:  Diagnosis Date  . Anemia   . Arthritis   . Cerebrovascular disease   . Chronic kidney disease (CKD), stage III (moderate) (HCC)   . Chronic pain   . Depression   . DJD (degenerative joint disease)   . DM (diabetes mellitus) ([place])   . Hair loss   . Headache   . Heme  positive stool   . Hirsutism   . Hypercholesteremia   . Hyperlipidemia   . Hypertension   . Hypothyroidism   . IBS (irritable bowel syndrome)   . 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 Peripheral neuropathy   . Preglaucoma   . Spinal stenosis   . Spinal stenosis   . Thyroid disease     Family History  Problem Relation Age of Onset  . Dementia Mother   . Lung cancer Father   . Migraines Daughter   . Breast cancer Neg Hx     Past Surgical History:  Procedure Laterality Date  . DILATATION & CURETTAGE/HYSTEROSCOPY WITH MYOSURE N/A 02/05/2022   Procedure: DILATATION & CURETTAGE/HYSTEROSCOPY WITH MYOSURE;  Surgeon: Christophe Louis, MD;  Location: [facility];  Service: Gynecology;  Laterality: N/A;  . NECK SURGERY     [phone]   Social History   Occupational History  . Occupation: Rtired     Fish farm manager: RETIRED  Tobacco Use  . Smoking status: Never  . Smokeless tobacco: Never  Substance and Sexual Activity  . Alcohol use: No  . Drug use: No  . Sexual activity: Not Currently

## 2022-07-10 ENCOUNTER — Telehealth: Payer: Self-pay | Admitting: Neurology

## 2022-07-10 NOTE — Telephone Encounter (Signed)
Pt"s son called wanting to be advised on how he can get his mother to take her donepezil (ARICEPT) 10 MG tablet Pt is refusing to take it and will say she will take it but wont. Son also states that she will say things like "Im not satisfied with the stair lift that was installed in the house, it's not as good as the one you (son) have. But the son does not have a stair lift in his home. Please advise.

## 2022-07-21 NOTE — Telephone Encounter (Signed)
Returned call to pt son and clarified what he actually needed. He wanted to know if Aricept can be crushed. Routing to provider to clarify

## 2022-07-21 NOTE — Telephone Encounter (Signed)
Called and spoke to pts son and stated that the aricept cannot be crushed he voiced gratitude and understanding.

## 2022-08-05 ENCOUNTER — Ambulatory Visit: Payer: Medicare PPO | Admitting: Neurology

## 2022-08-05 ENCOUNTER — Encounter: Payer: Self-pay | Admitting: Neurology

## 2022-08-05 VITALS — BP 180/84 | HR 97 | Ht 68.0 in | Wt 138.0 lb

## 2022-08-05 DIAGNOSIS — G309 Alzheimer's disease, unspecified: Secondary | ICD-10-CM | POA: Diagnosis not present

## 2022-08-05 DIAGNOSIS — R413 Other amnesia: Secondary | ICD-10-CM

## 2022-08-05 DIAGNOSIS — F0150 Vascular dementia without behavioral disturbance: Secondary | ICD-10-CM | POA: Diagnosis not present

## 2022-08-05 DIAGNOSIS — R42 Dizziness and giddiness: Secondary | ICD-10-CM | POA: Diagnosis not present

## 2022-08-05 DIAGNOSIS — F0280 Dementia in other diseases classified elsewhere without behavioral disturbance: Secondary | ICD-10-CM
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
## 2022-08-05 NOTE — Patient Instructions (Signed)
I had a long discussion with the patient, husband, son and daughter regarding her complaints of  memory loss and discussed results of MRI of the lbrain   I recommend a trial of Aricept 5 mg daily to be increased after 4 weeks to 10 mg as tolerated and explained possible side effects and advised her to call me if needed. If she cannot tolerate Aricept or has decline may consider Lequembi in the future I encouraged her to increase participation in cognitively challenging activities like solving crossword puzzles, playing bridge and sudoku. We also discussed memory compensation strategies   Memory Compensation Strategies  Use "WARM" strategy.  W= write it down  A= associate it  R= repeat it  M= make a mental note  2.   You can keep a Glass blower/designer.  Use a 3-ring notebook with sections for the following: calendar, important names and phone numbers,  medications, doctors' names/phone numbers, lists/reminders, and a section to journal what you did  each day.   3.    Use a calendar to write appointments down.  4.    Write yourself a schedule for the day.  This can be placed on the calendar or in a separate section of the Memory Notebook.  Keeping a  regular schedule can help memory.  5.    Use medication organizer with sections for each day or morning/evening pills.  You may need help loading it  6.    Keep a basket, or pegboard by the door.  Place items that you need to take out with you in the basket or on the pegboard.  You may also want to  include a message board for reminders.  7.    Use sticky notes.  Place sticky notes with reminders in a place where the task is performed.  For example: " turn off the  stove" placed by the stove, "lock the door" placed on the door at eye level, " take your medications" on  the bathroom mirror or by the place where you normally take your medications.  8.    Use alarms/timers.  Use while cooking to remind yourself to check on food or as a reminder to  take your medicine, or as a  reminder to make a call, or as a reminder to perform another task, etc.

## 2022-08-11 ENCOUNTER — Telehealth: Payer: Self-pay | Admitting: Neurology

## 2022-08-11 NOTE — Telephone Encounter (Signed)
Overnight pharmacist Thayer Ohm reports pt has some confusion as to how she is supposed to take her donepezil (ARICEPT) 10 MG tablet.  Thayer Ohm states he informed pt and spouse that he would call here for her to ask that a RN calls to clarify how in fact she should take this medication, please call pt.

## 2022-08-11 NOTE — Telephone Encounter (Signed)
Called the home phone number no answer. DPR states can leave a VM on pt cell. Called cell for pt and there was no answer. Unable to LVM for pt due to mailbox was full.   Per Dr Pearlean Brownie he recommends that the patient start  "a trial of Aricept 5 mg daily to be increased after 4 weeks to 10 mg as tolerated and explained possible side effects and advised her to call me if needed."  **If pt returns call, please advise that Dr Pearlean Brownie would like the patient to take donepezil 5 mg (half tablet) daily for 4 weeks. If tolerates well can increase to full tablet once a day. She can choose to take in am or pm but most people like to take it at bedtime. Whichever she does stick to same time every day.

## 2022-09-05 ENCOUNTER — Telehealth: Payer: Self-pay | Admitting: *Deleted

## 2022-09-05 ENCOUNTER — Other Ambulatory Visit: Payer: Self-pay

## 2022-09-05 DIAGNOSIS — F03918 Unspecified dementia, unspecified severity, with other behavioral disturbance: Secondary | ICD-10-CM

## 2022-09-05 NOTE — Progress Notes (Signed)
  Care Coordination   Note   09/05/2022 Name: Lauren Garrett MRN: [id number] DOB: 02-25-1946  [name] is a 77 y.o. year old female who sees Tisovec, Adelfa Koh, MD for primary care. I reached out to Abe People by phone today to offer care coordination services.  [name] was given information about Care Coordination services today including:   The Care Coordination services include support from the care team which includes your Nurse Coordinator, Clinical Social Worker, or Pharmacist.  The Care Coordination team is here to help remove barriers to the health concerns and goals most important to you. Care Coordination services are voluntary, and the patient may decline or stop services at any time by request to their care team member.   Care Coordination Consent Status: Patient daughter [name] DPR on file agreed to services and verbal consent obtained.   Follow up plan:  Telephone appointment with care coordination team member scheduled for:  SW on 09/09/22 and [facility] 09/19/22  Encounter Outcome:  Pt. Scheduled  [facility]  Care Coordination Care Guide  Direct Dial: [phone]

## 2022-09-09 ENCOUNTER — Ambulatory Visit: Payer: Self-pay | Admitting: Licensed Clinical Social Worker

## 2022-09-11 NOTE — Patient Instructions (Signed)
Visit Information  Thank you for taking time to visit with me today. Please don't hesitate to contact me if I can be of assistance to you.   Following are the goals we discussed today:   Goals Addressed             This Visit's Progress    Obtain Supportive Resources-Symptom Management   On track    Activities and task to complete in order to accomplish goals.   Keep all upcoming appointments discussed today Continue with compliance of taking medication prescribed by Doctor Implement healthy coping skills discussed to assist with management of symptoms Continue working with [facility] care team to assist with goals identified         Our next appointment is by telephone on 6/4 at 3:30 PM  Please call the care guide team at [phone] if you need to cancel or reschedule your appointment.   If you are experiencing a Mental Health or Behavioral Health Crisis or need someone to talk to, please call the Suicide and Crisis Lifeline: 988 call 911   Patient verbalizes understanding of instructions and care plan provided today and agrees to view in MyChart. Active MyChart status and patient understanding of how to access instructions and care plan via MyChart confirmed with patient.     Jenel Lucks, MSW, LCSW [facility] Care Management [place]  [facility] [email or web address] Phone [phone] 4:27 PM

## 2022-09-11 NOTE — Patient Outreach (Signed)
  Care Coordination   Initial Visit Note   09/09/2022 Name: CHERYCE GRANDSTAFF MRN: [id number] DOB: 1946-04-05  Elia B Speziale is a 77 y.o. year old female who sees Tisovec, Adelfa Koh, MD for primary care. I spoke with  Clairissa B Rolfe's daughter by phone today.  What matters to the patients health and wellness today?  Caregiver Fatigue, Symptom Management, Resources    Goals Addressed             This Visit's Progress    Obtain Supportive Resources-Symptom Management   On track    Activities and task to complete in order to accomplish goals.   Keep all upcoming appointments discussed today Continue with compliance of taking medication prescribed by Doctor Implement healthy coping skills discussed to assist with management of symptoms Continue working with [facility] care team to assist with goals identified         SDOH assessments and interventions completed:  No     Care Coordination Interventions:  Yes, provided  Interventions Today    Flowsheet Row Most Recent Value  General Interventions   General Interventions Discussed/Reviewed General Interventions Discussed, Community Resources, Doctor Visits  Doctor Visits Discussed/Reviewed Doctor Visits Discussed  Mental Health Interventions   Mental Health Discussed/Reviewed Mental Health Discussed, Coping Strategies, Anxiety  Pharmacy Interventions   Pharmacy Dicussed/Reviewed Pharmacy Topics Discussed, Medication Adherence  Medication Adherence Not taking medication  [Aricept]  Safety Interventions   Safety Discussed/Reviewed Safety Discussed       Follow up plan: Follow up call scheduled for 2-4 weeks    Encounter Outcome:  Pt. Visit Completed   Jenel Lucks, MSW, LCSW [facility] Care Management [facility]  [facility] [email or web address] Phone [phone] 4:26 PM

## 2022-09-19 ENCOUNTER — Ambulatory Visit: Payer: Self-pay

## 2022-09-19 DIAGNOSIS — F0150 Vascular dementia without behavioral disturbance: Secondary | ICD-10-CM

## 2022-09-19 NOTE — Patient Outreach (Signed)
  Care Coordination   Initial Visit Note   09/19/2022 Name: Lauren Garrett MRN: [id number] DOB: November 11, 1945  Lauren Garrett is a 77 y.o. year old female who sees Tisovec, Adelfa Koh, MD for primary care. I spoke with  Lauren Garrett and daughter Lauren Garrett by phone today.  What matters to the patients health and wellness today?  Patient would like to learn more about the [facility] Care Coordination program. Daughter Lauren Garrett would like to speak with a pharmacist to learn more about usage of transdermal Donepezil.     Goals Addressed             This Visit's Progress    COMPLETED: RN Care Coordination Activities: further follow up needed       Care Coordination Interventions: Placed successful outbound call with patient and daughter Lauren Garrett Discussed with patient she would like more information about the [facility] Care Coordination program before proceeding with this call  Printed letter and request for a [facility] brochure to be mailed to patient for review, she will not reschedule our call at this time Completed call with daughter Lauren Garrett, confirmed with daughter, she and her brother Charolotte Capuchin are listed on patient's DPR Determined daughter is most concerned about patient not agreeing to take her Aricept for worsening Dementia Reviewed and discussed the availability of the Donepezil transdermal patch if approved by patient's doctor [facility] pharmacy referral requesting further assistance to help educate daughter about this medication and to help determine if this medication may be a treatment option and or if it may be contraindicated   Discussed plans with daughter for ongoing care coordination follow up and provided Ayanna my direct contact information  Discussed Ayanna will call the [facility] Care Coordination team as needed for further assistance       Interventions Today    Flowsheet Row Most Recent Value  Chronic Disease   Chronic disease during today's visit Other  [change in  memory loss]  General Interventions   General Interventions Discussed/Reviewed General Interventions Discussed, General Interventions Reviewed, Doctor Visits  Doctor Visits Discussed/Reviewed Doctor Visits Discussed, Doctor Visits Reviewed, Specialist  Education Interventions   Education Provided Provided Education  Provided Verbal Education On Medication, Other  [[facility] program]  Mental Health Interventions   Mental Health Discussed/Reviewed Mental Health Discussed, Mental Health Reviewed, Other  [memory loss]  Pharmacy Interventions   Pharmacy Dicussed/Reviewed Pharmacy Topics Discussed, Pharmacy Topics Reviewed, Medications and their functions  Medication Adherence Not taking medication          SDOH assessments and interventions completed:  No     Care Coordination Interventions:  Yes, provided   Follow up plan: Follow up call scheduled for 09/23/22 @3 :30 with Jenel Lucks LCSW    Encounter Outcome:  Pt. Visit Completed

## 2022-09-19 NOTE — Patient Instructions (Signed)
Visit Information  Thank you for taking time to visit with me today. Please don't hesitate to contact me if I can be of assistance to you.   Following are the goals we discussed today:   Goals Addressed             This Visit's Progress    COMPLETED: RN Care Coordination Activities: further follow up needed       Care Coordination Interventions: Placed successful outbound call with patient and daughter [name] Discussed with patient she would like more information about the [facility] Care Coordination program before proceeding with this call  Printed letter and request for a [facility] brochure to be mailed to patient for review, she will not reschedule our call at this time Completed call with daughter [name], confirmed with daughter, she and her brother Charolotte Capuchin are listed on patient's DPR Determined daughter is most concerned about patient not agreeing to take her Aricept for worsening Dementia Reviewed and discussed the availability of the Donepezil transdermal patch if approved by patient's doctor [facility] pharmacy referral requesting further assistance to help educate daughter about this medication and to help determine if this medication may be a treatment option and or if it may be contraindicated   Discussed plans with daughter for ongoing care coordination follow up and provided Ayanna my direct contact information  Discussed [name] will call the [facility] Care Coordination team as needed for further assistance           Please call when you are ready to schedule our next nurse care coordination follow up call.   Please call the care guide team at [phone] if you need to cancel or reschedule your appointment.   If you are experiencing a Mental Health or Behavioral Health Crisis or need someone to talk to, please call 1-800-273-TALK (toll free, 24 hour hotline)  Patient verbalizes understanding of instructions and care plan provided today and agrees to view in MyChart. Active MyChart  status and patient understanding of how to access instructions and care plan via MyChart confirmed with patient.     Delsa Sale, RN, BSN, CCM Care Management Coordinator [facility] Care Management  Direct Phone: [phone]

## 2022-09-23 ENCOUNTER — Ambulatory Visit: Payer: Self-pay | Admitting: Licensed Clinical Social Worker

## 2022-09-24 NOTE — Patient Instructions (Signed)
Visit Information  Thank you for taking time to visit with me today. Please don't hesitate to contact me if I can be of assistance to you.   Following are the goals we discussed today:   Goals Addressed             This Visit's Progress    Obtain Supportive Resources-Symptom Management   On track    Activities and task to complete in order to accomplish goals.   Keep all upcoming appointments discussed today Continue with compliance of taking medication prescribed by Doctor Implement healthy coping skills discussed to assist with management of symptoms Continue working with [facility] care team to assist with goals identified         Please call the care guide team at [phone] if you need to cancel or reschedule your appointment.   If you are experiencing a Mental Health or Behavioral Health Crisis or need someone to talk to, please call the Suicide and Crisis Lifeline: 988 call 911   Patient verbalizes understanding of instructions and care plan provided today and agrees to view in MyChart. Active MyChart status and patient understanding of how to access instructions and care plan via MyChart confirmed with patient.     Jenel Lucks, MSW, LCSW [facility] Care Management [place]  [facility] [email or web address] Phone [phone] 6:48 PM

## 2022-09-24 NOTE — Patient Outreach (Signed)
  Care Coordination   Follow Up Visit Note   09/23/2022 Name: Lauren Garrett MRN: [id number] DOB: [birth year]  Lauren Garrett is a 77 y.o. year old female who sees Tisovec, Adelfa Koh, MD for primary care. I spoke with  Selenia B Grist's daughter by phone today.  What matters to the patients health and wellness today?  Symptom Management    Goals Addressed             This Visit's Progress    Obtain Supportive Resources-Symptom Management   On track    Activities and task to complete in order to accomplish goals.   Keep all upcoming appointments discussed today Continue with compliance of taking medication prescribed by Doctor Implement healthy coping skills discussed to assist with management of symptoms Continue working with [facility] care team to assist with goals identified         SDOH assessments and interventions completed:  No     Care Coordination Interventions:  Yes, provided  Interventions Today    Flowsheet Row Most Recent Value  Chronic Disease   Chronic disease during today's visit --  [GAD]  General Interventions   General Interventions Discussed/Reviewed General Interventions Reviewed, Doctor Visits  [Pt's daughter reports feeling supported by [facility]. Will await brochure on [facility] and services we provide to support pt and family]  Doctor Visits Discussed/Reviewed Doctor Visits Reviewed  Mental Health Interventions   Mental Health Discussed/Reviewed Mental Health Reviewed, Coping Strategies  [Caregiver Fatigue]       Follow up plan: Follow up call scheduled for 2-6 weeks    Encounter Outcome:  Pt. Visit Completed   Jenel Lucks, MSW, LCSW [facility] Care Management [facility]  [facility] [email or web address] Phone [phone] 6:48 PM

## 2022-09-26 ENCOUNTER — Encounter: Payer: Self-pay | Admitting: Licensed Clinical Social Worker

## 2022-09-29 NOTE — Patient Instructions (Signed)
Visit Information  Thank you for taking time to visit with me today. Please don't hesitate to contact me if I can be of assistance to you.   Following are the goals we discussed today:   Goals Addressed             This Visit's Progress    Obtain Supportive Resources-Symptom Management   On track    Activities and task to complete in order to accomplish goals.   Keep all upcoming appointments discussed today Continue with compliance of taking medication prescribed by Doctor Implement healthy coping skills discussed to assist with management of symptoms Continue working with [facility] care team to assist with goals identified Review resources provided via email to strengthen support        Please call the care guide team at [phone] if you need to cancel or reschedule your appointment.   If you are experiencing a Mental Health or Behavioral Health Crisis or need someone to talk to, please call the Suicide and Crisis Lifeline: 988 call 911    Jenel Lucks, MSW, LCSW [facility] Care Management [facility]  [facility] [email or web address] Phone [phone] 11:34 PM

## 2022-09-29 NOTE — Patient Outreach (Signed)
  Care Coordination   Follow Up Visit Note   09/26/2022 Name: KJERSTI DITTMER MRN: [id number] DOB: May 09, 1945  Lottie B Spearing is a 77 y.o. year old female who sees Tisovec, Adelfa Koh, MD for primary care. I  engaged with pt's daughter via email  What matters to the patients health and wellness today?  Patient was not engaged during this encounter    Goals Addressed             This Visit's Progress    Obtain Supportive Resources-Symptom Management   On track    Activities and task to complete in order to accomplish goals.   Keep all upcoming appointments discussed today Continue with compliance of taking medication prescribed by Doctor Implement healthy coping skills discussed to assist with management of symptoms Continue working with [facility] care team to assist with goals identified Review resources provided via email to strengthen support         SDOH assessments and interventions completed:  No     Care Coordination Interventions:  Yes, provided  Interventions Today    Flowsheet Row Most Recent Value  General Interventions   General Interventions Discussed/Reviewed --  [LCSW provided supportive resources regarding educational/caregiver support]       Follow up plan: Follow up call scheduled for 3-6 weeks    Encounter Outcome:  Pt. Visit Completed   Jenel Lucks, MSW, LCSW [facility] Care Management [facility]  [facility] [email or web address] Phone [phone] 11:34 PM

## 2022-10-15 ENCOUNTER — Telehealth: Payer: Self-pay | Admitting: Licensed Clinical Social Worker

## 2022-10-15 NOTE — Patient Instructions (Signed)
Visit Information  Thank you for taking time to visit with me today. Please don't hesitate to contact me if I can be of assistance to you.   Following are the goals we discussed today:   Goals Addressed             This Visit's Progress    Obtain Supportive Resources-Symptom Management   On track    Activities and task to complete in order to accomplish goals.   Keep all upcoming appointments discussed today Continue with compliance of taking medication prescribed by Doctor Implement healthy coping skills discussed to assist with management of symptoms Continue working with [facility] care team to assist with goals identified Review resources provided via email to strengthen support         Please call the care guide team at [phone] if you need to cancel or reschedule your appointment.   If you are experiencing a Mental Health or Behavioral Health Crisis or need someone to talk to, please call the Suicide and Crisis Lifeline: 988 call 911   Patient verbalizes understanding of instructions and care plan provided today and agrees to view in MyChart. Active MyChart status and patient understanding of how to access instructions and care plan via MyChart confirmed with patient.     Jenel Lucks, MSW, LCSW [facility] Care Management [place]  [facility] [email or web address] Phone [phone] 2:29 PM

## 2022-10-15 NOTE — Patient Outreach (Signed)
  Care Coordination   Follow Up Visit Note   10/13/2022 Name: Lauren Garrett MRN: [id number] DOB: 1945/06/30  Lauren Garrett is a 77 y.o. year old female who sees Tisovec, Adelfa Koh, MD for primary care. I spoke with  Lauren Garrett's adult daughter by phone today.  What matters to the patients health and wellness today?  Referral f/up    Goals Addressed             This Visit's Progress    Obtain Supportive Resources-Symptom Management   On track    Activities and task to complete in order to accomplish goals.   Keep all upcoming appointments discussed today Continue with compliance of taking medication prescribed by Doctor Implement healthy coping skills discussed to assist with management of symptoms Continue working with [facility] care team to assist with goals identified Review resources provided via email to strengthen support         SDOH assessments and interventions completed:  No     Care Coordination Interventions:  Yes, provided  Interventions Today    Flowsheet Row Most Recent Value  Chronic Disease   Chronic disease during today's visit --  [GAD, Memory concerns]  General Interventions   General Interventions Discussed/Reviewed General Interventions Reviewed, Doctor Visits, Communication with  Doctor Visits Discussed/Reviewed Doctor Visits Reviewed  Communication with RN  [LCSW will collab with [facility] regarding pharmacy referral]  Pharmacy Interventions   Pharmacy Dicussed/Reviewed Pharmacy Topics Reviewed, Medication Adherence, Referral to Pharmacist  [facility] f/up on previous Pharmacy referral. Would like to be contactd about medication options]       Follow up plan: Follow up call scheduled for 2-4 weeks    Encounter Outcome:  Pt. Visit Completed   Jenel Lucks, MSW, LCSW [facility] Care Management [facility]  [facility] [email or web address] Phone [phone] 2:29 PM

## 2022-11-26 ENCOUNTER — Telehealth: Payer: Self-pay | Admitting: Licensed Clinical Social Worker

## 2022-11-26 NOTE — Patient Outreach (Signed)
  Care Coordination   Follow Up Visit Note   11/24/2022 Name: SORREL HARTSTEIN MRN: [id number] DOB: [birth year]  Teddi B Bade is a 77 y.o. year old female who sees Tisovec, Adelfa Koh, MD for primary care. I spoke with  Kenesha B Alter's daughter by phone today.  What matters to the patients health and wellness today?  Symptom Management    Goals Addressed             This Visit's Progress    Obtain Supportive Resources-Symptom Management   On track    Activities and task to complete in order to accomplish goals.   Keep all upcoming appointments discussed today Continue with compliance of taking medication prescribed by Doctor Implement healthy coping skills discussed to assist with management of symptoms Continue working with [facility] care team to assist with goals identified Review resources provided via email to strengthen support         SDOH assessments and interventions completed:  No     Care Coordination Interventions:  Yes, provided  Interventions Today    Flowsheet Row Most Recent Value  Chronic Disease   Chronic disease during today's visit Other  [GAD]  General Interventions   General Interventions Discussed/Reviewed General Interventions Reviewed, Doctor Visits, Communication with  [Concerns discussed regarding difficulty managing chronic health conditions. Request for RNCM to f/up with familty to discuss strategies to strengthen support and tx planning]  Doctor Visits Discussed/Reviewed Doctor Visits Reviewed  Communication with RN  [LCSW will collaborate with [facility] and Care Guide on scheduling]  Mental Health Interventions   Mental Health Discussed/Reviewed Mental Health Reviewed, Coping Strategies  [Caregiver Strain]  Nutrition Interventions   Nutrition Discussed/Reviewed Nutrition Reviewed  Pharmacy Interventions   Pharmacy Dicussed/Reviewed Pharmacy Topics Reviewed, Medication Adherence  Safety Interventions   Safety Discussed/Reviewed Safety  Reviewed       Follow up plan: Follow up call scheduled for 2-4 weeks    Encounter Outcome:  Pt. Visit Completed   Jenel Lucks, MSW, LCSW [facility] Care Management [facility] Health  [facility] [email or web address] Phone [phone] 11:37 PM

## 2022-11-26 NOTE — Patient Instructions (Signed)
Visit Information  Thank you for taking time to visit with me today. Please don't hesitate to contact me if I can be of assistance to you.   Following are the goals we discussed today:   Goals Addressed             This Visit's Progress    Obtain Supportive Resources-Symptom Management   On track    Activities and task to complete in order to accomplish goals.   Keep all upcoming appointments discussed today Continue with compliance of taking medication prescribed by Doctor Implement healthy coping skills discussed to assist with management of symptoms Continue working with [facility] care team to assist with goals identified Review resources provided via email to strengthen support        Please call the care guide team at [phone] if you need to cancel or reschedule your appointment.   If you are experiencing a Mental Health or Behavioral Health Crisis or need someone to talk to, please call the Suicide and Crisis Lifeline: 988 call 911   Patient verbalizes understanding of instructions and care plan provided today and agrees to view in MyChart. Active MyChart status and patient understanding of how to access instructions and care plan via MyChart confirmed with patient.     Jenel Lucks, MSW, LCSW [facility] Care Management [place]  [facility] [email or web address] Phone [phone] 11:38 PM

## 2022-12-04 ENCOUNTER — Encounter (HOSPITAL_COMMUNITY): Payer: Self-pay | Admitting: Emergency Medicine

## 2022-12-04 ENCOUNTER — Emergency Department (HOSPITAL_COMMUNITY): Payer: Medicare PPO

## 2022-12-04 ENCOUNTER — Inpatient Hospital Stay (HOSPITAL_COMMUNITY)
Admission: EM | Admit: 2022-12-04 | Discharge: 2022-12-11 | DRG: 071 | Disposition: A | Discharge status: Skilled Nursing Facility | Payer: Medicare PPO | Attending: Family Medicine | Admitting: Family Medicine

## 2022-12-04 ENCOUNTER — Other Ambulatory Visit: Payer: Self-pay

## 2022-12-04 DIAGNOSIS — G9340 Encephalopathy, unspecified: Secondary | ICD-10-CM | POA: Diagnosis present

## 2022-12-04 DIAGNOSIS — E7800 Pure hypercholesterolemia, unspecified: Secondary | ICD-10-CM | POA: Diagnosis present

## 2022-12-04 DIAGNOSIS — G319 Degenerative disease of nervous system, unspecified: Secondary | ICD-10-CM | POA: Diagnosis not present

## 2022-12-04 DIAGNOSIS — M5459 Other low back pain: Secondary | ICD-10-CM | POA: Diagnosis present

## 2022-12-04 DIAGNOSIS — R4182 Altered mental status, unspecified: Secondary | ICD-10-CM | POA: Diagnosis not present

## 2022-12-04 DIAGNOSIS — T502X5A Adverse effect of carbonic-anhydrase inhibitors, benzothiadiazides and other diuretics, initial encounter: Secondary | ICD-10-CM | POA: Diagnosis not present

## 2022-12-04 DIAGNOSIS — S0990XA Unspecified injury of head, initial encounter: Secondary | ICD-10-CM | POA: Diagnosis not present

## 2022-12-04 DIAGNOSIS — M25462 Effusion, left knee: Secondary | ICD-10-CM | POA: Diagnosis present

## 2022-12-04 DIAGNOSIS — M25562 Pain in left knee: Secondary | ICD-10-CM

## 2022-12-04 DIAGNOSIS — R41841 Cognitive communication deficit: Secondary | ICD-10-CM | POA: Diagnosis not present

## 2022-12-04 DIAGNOSIS — G311 Senile degeneration of brain, not elsewhere classified: Secondary | ICD-10-CM | POA: Diagnosis present

## 2022-12-04 DIAGNOSIS — M5104 Intervertebral disc disorders with myelopathy, thoracic region: Secondary | ICD-10-CM | POA: Diagnosis not present

## 2022-12-04 DIAGNOSIS — G9589 Other specified diseases of spinal cord: Secondary | ICD-10-CM | POA: Diagnosis present

## 2022-12-04 DIAGNOSIS — M48061 Spinal stenosis, lumbar region without neurogenic claudication: Secondary | ICD-10-CM | POA: Diagnosis present

## 2022-12-04 DIAGNOSIS — G9009 Other idiopathic peripheral autonomic neuropathy: Secondary | ICD-10-CM | POA: Diagnosis not present

## 2022-12-04 DIAGNOSIS — F0150 Vascular dementia without behavioral disturbance: Secondary | ICD-10-CM | POA: Diagnosis not present

## 2022-12-04 DIAGNOSIS — R0902 Hypoxemia: Secondary | ICD-10-CM | POA: Diagnosis not present

## 2022-12-04 DIAGNOSIS — E039 Hypothyroidism, unspecified: Secondary | ICD-10-CM | POA: Diagnosis present

## 2022-12-04 DIAGNOSIS — T441X6A Underdosing of other parasympathomimetics, initial encounter: Secondary | ICD-10-CM | POA: Diagnosis present

## 2022-12-04 DIAGNOSIS — E860 Dehydration: Secondary | ICD-10-CM | POA: Diagnosis present

## 2022-12-04 DIAGNOSIS — M170 Bilateral primary osteoarthritis of knee: Secondary | ICD-10-CM | POA: Diagnosis present

## 2022-12-04 DIAGNOSIS — Z79890 Hormone replacement therapy (postmenopausal): Secondary | ICD-10-CM

## 2022-12-04 DIAGNOSIS — I10 Essential (primary) hypertension: Secondary | ICD-10-CM | POA: Diagnosis present

## 2022-12-04 DIAGNOSIS — F01B0 Vascular dementia, moderate, without behavioral disturbance, psychotic disturbance, mood disturbance, and anxiety: Secondary | ICD-10-CM | POA: Diagnosis present

## 2022-12-04 DIAGNOSIS — N1831 Chronic kidney disease, stage 3a: Secondary | ICD-10-CM | POA: Diagnosis present

## 2022-12-04 DIAGNOSIS — I129 Hypertensive chronic kidney disease with stage 1 through stage 4 chronic kidney disease, or unspecified chronic kidney disease: Secondary | ICD-10-CM | POA: Diagnosis present

## 2022-12-04 DIAGNOSIS — R32 Unspecified urinary incontinence: Secondary | ICD-10-CM | POA: Diagnosis present

## 2022-12-04 DIAGNOSIS — I6782 Cerebral ischemia: Secondary | ICD-10-CM | POA: Diagnosis present

## 2022-12-04 DIAGNOSIS — M5000 Cervical disc disorder with myelopathy, unspecified cervical region: Secondary | ICD-10-CM | POA: Diagnosis not present

## 2022-12-04 DIAGNOSIS — M6088 Other myositis, other site: Secondary | ICD-10-CM | POA: Diagnosis present

## 2022-12-04 DIAGNOSIS — M064 Inflammatory polyarthropathy: Secondary | ICD-10-CM | POA: Diagnosis present

## 2022-12-04 DIAGNOSIS — M21062 Valgus deformity, not elsewhere classified, left knee: Secondary | ICD-10-CM | POA: Diagnosis not present

## 2022-12-04 DIAGNOSIS — Z881 Allergy status to other antibiotic agents status: Secondary | ICD-10-CM

## 2022-12-04 DIAGNOSIS — R296 Repeated falls: Secondary | ICD-10-CM | POA: Diagnosis present

## 2022-12-04 DIAGNOSIS — G629 Polyneuropathy, unspecified: Secondary | ICD-10-CM | POA: Diagnosis present

## 2022-12-04 DIAGNOSIS — G9341 Metabolic encephalopathy: Principal | ICD-10-CM | POA: Diagnosis present

## 2022-12-04 DIAGNOSIS — Z888 Allergy status to other drugs, medicaments and biological substances status: Secondary | ICD-10-CM

## 2022-12-04 DIAGNOSIS — R768 Other specified abnormal immunological findings in serum: Secondary | ICD-10-CM | POA: Diagnosis present

## 2022-12-04 DIAGNOSIS — Z91199 Patient's noncompliance with other medical treatment and regimen due to unspecified reason: Secondary | ICD-10-CM

## 2022-12-04 DIAGNOSIS — E1122 Type 2 diabetes mellitus with diabetic chronic kidney disease: Secondary | ICD-10-CM | POA: Diagnosis present

## 2022-12-04 DIAGNOSIS — M1712 Unilateral primary osteoarthritis, left knee: Secondary | ICD-10-CM | POA: Diagnosis not present

## 2022-12-04 DIAGNOSIS — Z7401 Bed confinement status: Secondary | ICD-10-CM | POA: Diagnosis not present

## 2022-12-04 DIAGNOSIS — W19XXXA Unspecified fall, initial encounter: Secondary | ICD-10-CM | POA: Diagnosis present

## 2022-12-04 DIAGNOSIS — Z043 Encounter for examination and observation following other accident: Secondary | ICD-10-CM | POA: Diagnosis not present

## 2022-12-04 DIAGNOSIS — Z981 Arthrodesis status: Secondary | ICD-10-CM

## 2022-12-04 DIAGNOSIS — H1132 Conjunctival hemorrhage, left eye: Secondary | ICD-10-CM | POA: Diagnosis not present

## 2022-12-04 DIAGNOSIS — E876 Hypokalemia: Secondary | ICD-10-CM | POA: Diagnosis present

## 2022-12-04 DIAGNOSIS — R531 Weakness: Secondary | ICD-10-CM | POA: Diagnosis not present

## 2022-12-04 DIAGNOSIS — N179 Acute kidney failure, unspecified: Secondary | ICD-10-CM | POA: Diagnosis not present

## 2022-12-04 DIAGNOSIS — Z7982 Long term (current) use of aspirin: Secondary | ICD-10-CM

## 2022-12-04 DIAGNOSIS — M6281 Muscle weakness (generalized): Secondary | ICD-10-CM | POA: Diagnosis not present

## 2022-12-04 DIAGNOSIS — E8809 Other disorders of plasma-protein metabolism, not elsewhere classified: Secondary | ICD-10-CM | POA: Diagnosis not present

## 2022-12-04 DIAGNOSIS — Z801 Family history of malignant neoplasm of trachea, bronchus and lung: Secondary | ICD-10-CM

## 2022-12-04 DIAGNOSIS — Z1152 Encounter for screening for COVID-19: Secondary | ICD-10-CM | POA: Diagnosis not present

## 2022-12-04 DIAGNOSIS — Z79899 Other long term (current) drug therapy: Secondary | ICD-10-CM

## 2022-12-04 LAB — CBC WITH DIFFERENTIAL/PLATELET
Abs Immature Granulocytes: 0.06 10*3/uL (ref 0.00–0.07)
Basophils Absolute: 0 10*3/uL (ref 0.0–0.1)
Basophils Relative: 0 %
Eosinophils Absolute: 0 10*3/uL (ref 0.0–0.5)
Eosinophils Relative: 0 %
HCT: 41.1 % (ref 36.0–46.0)
Hemoglobin: 13.4 g/dL (ref 12.0–15.0)
Immature Granulocytes: 1 %
Lymphocytes Relative: 9 %
Lymphs Abs: 0.7 10*3/uL (ref 0.7–4.0)
MCH: 31.1 pg (ref 26.0–34.0)
MCHC: 32.6 g/dL (ref 30.0–36.0)
MCV: 95.4 fL (ref 80.0–100.0)
Monocytes Absolute: 0.4 10*3/uL (ref 0.1–1.0)
Monocytes Relative: 5 %
Neutro Abs: 7 10*3/uL (ref 1.7–7.7)
Neutrophils Relative %: 85 %
Platelets: 242 10*3/uL (ref 150–400)
RBC: 4.31 MIL/uL (ref 3.87–5.11)
RDW: 13.8 % (ref 11.5–15.5)
WBC: 8.1 10*3/uL (ref 4.0–10.5)
nRBC: 0 % (ref 0.0–0.2)

## 2022-12-04 LAB — LACTIC ACID, PLASMA: Lactic Acid, Venous: 1.5 mmol/L (ref 0.5–1.9)

## 2022-12-04 LAB — RESP PANEL BY RT-PCR (RSV, FLU A&B, COVID)  RVPGX2
Influenza A by PCR: NEGATIVE
Influenza B by PCR: NEGATIVE
Resp Syncytial Virus by PCR: NEGATIVE
SARS Coronavirus 2 by RT PCR: NEGATIVE

## 2022-12-04 NOTE — ED Provider Notes (Addendum)
[place] EMERGENCY DEPARTMENT AT [facility] Provider Note   CSN: [id number] Arrival date & time: 12/04/22  1725     History  Chief Complaint  Patient presents with   Fall   Altered Mental Status   Weakness    Lauren Garrett is a 77 y.o. female w/ hx of mixed dementia, BL OA, urinary incontinence, spinal stenosis that p/f worsening AMS and fall.   Hx provided by pt, daughter, son, and husband. Per family, pt has hx of dementia but was acting at her neurologic baseline last week. Normally, she is able to hold full conversations. Sunday night, she was able to walk with her cane. Then Monday, she started to get more confused, fatigued, lethargic, and generally weak. She is speaking in incomplete sentences which was unusual for her. Then this morning, family found her on the floor of her room, on her back. Unclear when she fell or how she fell. Pt does not remember. Family tried to get her up for a few hours, but then had to call son from work who came and picked her up. They cleaned her up, gave her water, and reports she perked up a little but still fatigued. Then home health came to eval pt ~3pm, they called her PCP, and was recommended to come to ED. Pt has also been complaining more of R knee pain this week. Then with EMS today, they had trouble extending her L knee due to pain, so they placed a pillow under it.  Denies diarrhea, constipation, SOB, congestion, dysuria, or change in urinary frequency.   Was diagnosed w/ mixed dementia on December 2023. Has urinary incontinence at baseline, wears Depends. Denies medicatyion changes    Fall  Altered Mental Status Associated symptoms: weakness   Weakness      Home Medications Prior to Admission medications   Medication Sig Start Date End Date Taking? Authorizing Provider  Apoaequorin (PREVAGEN PO) Take 1 tablet by mouth in the morning and at bedtime.    [provider]  aspirin EC 81 MG tablet Take 81  mg by mouth daily.    [provider]  atorvastatin (LIPITOR) 20 MG tablet Take 20 mg by mouth daily.    [provider]  cholecalciferol (VITAMIN D3) 25 MCG (1000 UNIT) tablet Take 1,000 Units by mouth daily.    [provider]  diclofenac (VOLTAREN) 75 MG EC tablet Take 75 mg by mouth 2 (two) times daily as needed.    [provider]  diclofenac sodium (VOLTAREN) 1 % GEL Apply 2 g topically 4 (four) times daily as needed (pain).    [provider]  donepezil (ARICEPT) 10 MG tablet Take 1 tablet (10 mg total) by mouth at bedtime. Start 1/2 tablet daily x 4 weeks and then 1 tablet daily Patient not taking: Reported on 08/05/2022 04/02/22   Micki Riley, MD  HYDROcodone-acetaminophen (NORCO/VICODIN) 5-325 MG tablet Take 1-2 tablets by mouth every 6 (six) hours as needed for moderate pain. 02/05/22   Gerald Leitz, MD  irbesartan (AVAPRO) 300 MG tablet Take 300 mg by mouth daily.  07/28/16   [provider]  iron polysaccharides (NIFEREX) 150 MG capsule Take 150 mg by mouth 2 (two) times daily. Nu iron    [provider]  Multiple Vitamin (MULTIVITAMIN WITH MINERALS) TABS Take 1 tablet by mouth daily.    [provider]  Omega-3 Fatty Acids (FISH OIL) 1200 MG CAPS Take 1 capsule by mouth daily.  [provider]  SYNTHROID 88 MCG tablet Take 88 mcg by mouth daily. 06/26/21   [provider]  tiZANidine (ZANAFLEX) 4 MG tablet Take half to one (2 to 4 mg) by mouth daily at bedtime.    [provider]  triamterene-hydrochlorothiazide (MAXZIDE-25) 37.5-25 MG per tablet Take 1 tablet by mouth daily.    [provider]      Allergies    Other, Topamax [topiramate], and Macrobid [nitrofurantoin monohydrate macrocrystals]    Review of Systems   Review of Systems  Neurological:  Positive for weakness.   Physical Exam Updated Vital Signs BP (!) 185/91   Pulse 95   Temp 98.4 F (36.9 C) (Oral)    Resp 20   Ht 5\' 8"  (1.727 m)   Wt 62 kg   SpO2 100%   BMI 20.78 kg/m  Physical Exam Gen: Alert, pleasantly demented, non--toxic appearing older woman. NAD. Face symmetrical.  Neuro: A & O to self and place (hospital in [place]), but not oriented to day of week or year. Strength 5/5 and symmetric plantarflexion and grip strength of BL hands. Unable to lift BL LE against gravity. Sensation grossly intact and symmetric in BL LE.  HEENT: NCAT. MMM. PERRLA. CV: RRR, no murmurs. Resp: CTAB. Normal WOB on RA. Abm: soft, nontender, nondistended. Normal BS.  MSK: L knee swollen and mildly red compared to R knee. Tender to palpation along anterior aspect of L knee. Pain with flexion and extension of L knee. No pain to palpation of R knee or BL hips. No pitting edema.    ED Results / Procedures / Treatments   Labs (all labs ordered are listed, but only abnormal results are displayed) Labs Reviewed  RESP PANEL BY RT-PCR (RSV, FLU A&B, COVID)  [id number]  CBC WITH DIFFERENTIAL/PLATELET  LACTIC ACID, PLASMA  LACTIC ACID, PLASMA  URINALYSIS, W/ REFLEX TO CULTURE (INFECTION SUSPECTED)  TSH  AMMONIA  CK  COMPREHENSIVE METABOLIC PANEL    EKG EKG Interpretation Date/Time:  Thursday December 04 2022 19:32:39 EDT Ventricular Rate:  86 PR Interval:  122 QRS Duration:  75 QT Interval:  358 QTC Calculation: 429 R Axis:   174  Text Interpretation: Right and left arm electrode reversal, interpretation assumes no reversal Sinus rhythm Probable lateral infarct, age indeterminate Probable anteroseptal infarct, old when compared to prior, similar appearance. No STEMI Confirmed by Theda Belfast ([id number]) on 12/04/2022 8:47:11 PM  Radiology DG Knee Complete 4 Views Left  Result Date: 12/04/2022 CLINICAL DATA:  Fall with swelling and pain EXAM: LEFT KNEE - COMPLETE 4+ VIEW COMPARISON:  05/31/2020, 07/12/2015 FINDINGS: Valgus deformity at the knee. No definitive fracture or dislocation. Stable sclerotic  lesion in the distal femur consistent with benign finding. Large knee effusion. 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 No definitive acute osseous abnormality. 2. Large knee effusion. If persistent concern for fracture, would correlate with CT 3. Moderate to severe tricompartment arthritis of the knee. Electronically Signed   By: Jasmine Pang M.D.   On: 12/04/2022 20:05   DG Chest Portable 1 View  Result Date: 12/04/2022 CLINICAL DATA:  Fall with subsequent swelling and pain left knee EXAM: PORTABLE CHEST 1 VIEW COMPARISON:  07/30/2005 FINDINGS: Hardware in the cervical spine. No acute airspace disease. Normal cardiomediastinal silhouette. No pneumothorax IMPRESSION: No active disease. Electronically Signed   By: Jasmine Pang M.D.   On: 12/04/2022 20:02   CT Head Wo Contrast  Result Date: 12/04/2022 CLINICAL DATA:  Initial evaluation for acute head trauma, found down. EXAM: CT HEAD WITHOUT CONTRAST TECHNIQUE: Contiguous axial images were obtained from the base of the skull through the vertex without intravenous contrast. RADIATION DOSE REDUCTION: This exam was performed according to the departmental dose-optimization program which includes automated exposure control, adjustment of the mA and/or kV according to patient size and/or use of iterative reconstruction technique. COMPARISON:  Prior study from 01/08/2022. FINDINGS: Brain: Generalized age-related cerebral atrophy with moderately advanced chronic microvascular ischemic disease. No acute intracranial hemorrhage. No acute large vessel territory infarct. No mass lesion or midline shift. Mild ventricular prominence related to global parenchymal volume loss without hydrocephalus. No extra-axial fluid collection. Vascular: No abnormal hyperdense vessel. Calcified atherosclerosis present at the skull base. Skull: No visible scalp soft tissue injury.  Calvarium intact.  Sinuses/Orbits: Globes and orbital soft tissues within normal limits. Paranasal sinuses are clear. No mastoid effusion. Other: None. IMPRESSION: 1. No acute intracranial abnormality. 2. Generalized age-related cerebral atrophy with moderately advanced chronic microvascular ischemic disease. Electronically Signed   By: Rise Mu M.D.   On: 12/04/2022 20:01    Procedures Procedures   Medications Ordered in ED Medications - No data to display  ED Course/ Medical Decision Making/ A&P Clinical Course as of 12/04/22 2337  Thu Dec 04, 2022  1918 SpO2: 100 % [JZ]    Clinical Course User Index [JZ] Lincoln Brigham, MD                                Medical Decision Making:   KNIYA RIDDLEY is a 77 y.o. female who presented to the ED today with AMS and L knee pain detailed above.     Complete initial physical exam performed, notably the patient had pain of L knee and swelling.    Reviewed and confirmed nursing documentation for past medical history, family history, social history.    Initial Assessment:   With the patient's presentation of AMS and fall, differential for AMS includes metabolic disturbance from infectious etiologies (viral URI, UTI, PNA), arryhthmias, stroke, electrolyte abnormalities, thyroid dysfunction. Given unknown duration of fall, will check CK for potential rhabdo. Given swelling of L knee, will obtain XR.   This is most consistent with an acute complicated illness  Initial Plan:  CT head w/o con to workup intracranial changes CK to workup potential rhabdomyolysis given unknown duration of fall BMP and CBC UA, lactic acid, CXR, and COVID RPP to workup potential infectious etiologies L knee XR to workup knee pain and swelling Ammonia, TSH to workup AMS EKG to workup potential Cardiac etiologies   Initial Study Results:   Laboratory  All laboratory results reviewed without evidence of clinically relevant pathology.   - lactic wnl - no leukocytosis or  neutrophilia - covid RPP neg  EKG EKG was reviewed independently. Rate, rhythm, axis, intervals all examined and without medically relevant abnormality. ST segments without concerns for elevations.    Radiology:  All images reviewed independently. Agree with radiology report at this time.   DG Knee Complete 4 Views Left  Result Date: 12/04/2022 CLINICAL DATA:  Fall with swelling and pain EXAM: LEFT KNEE - COMPLETE 4+ VIEW COMPARISON:  05/31/2020, 07/12/2015 FINDINGS: Valgus deformity at the knee. No definitive fracture or dislocation. Stable sclerotic lesion in the distal femur consistent with benign finding. Large knee effusion. Moderate severe tricompartment arthritis of the knee worst involving the lateral  and patellofemoral joint spaces IMPRESSION: 1. No definitive acute osseous abnormality. 2. Large knee effusion. If persistent concern for fracture, would correlate with CT 3. Moderate to severe tricompartment arthritis of the knee. Electronically Signed   By: Jasmine Pang M.D.   On: 12/04/2022 20:05   DG Chest Portable 1 View  Result Date: 12/04/2022 CLINICAL DATA:  Fall with subsequent swelling and pain left knee EXAM: PORTABLE CHEST 1 VIEW COMPARISON:  07/30/2005 FINDINGS: Hardware in the cervical spine. No acute airspace disease. Normal cardiomediastinal silhouette. No pneumothorax IMPRESSION: No active disease. Electronically Signed   By: Jasmine Pang M.D.   On: 12/04/2022 20:02   CT Head Wo Contrast  Result Date: 12/04/2022 CLINICAL DATA:  Initial evaluation for acute head trauma, found down. EXAM: CT HEAD WITHOUT CONTRAST TECHNIQUE: Contiguous axial images were obtained from the base of the skull through the vertex without intravenous contrast. RADIATION DOSE REDUCTION: This exam was performed according to the departmental dose-optimization program which includes automated exposure control, adjustment of the mA and/or kV according to patient size and/or use of iterative reconstruction  technique. COMPARISON:  Prior study from 01/08/2022. FINDINGS: Brain: Generalized age-related cerebral atrophy with moderately advanced chronic microvascular ischemic disease. No acute intracranial hemorrhage. No acute large vessel territory infarct. No mass lesion or midline shift. Mild ventricular prominence related to global parenchymal volume loss without hydrocephalus. No extra-axial fluid collection. Vascular: No abnormal hyperdense vessel. Calcified atherosclerosis present at the skull base. Skull: No visible scalp soft tissue injury.  Calvarium intact. Sinuses/Orbits: Globes and orbital soft tissues within normal limits. Paranasal sinuses are clear. No mastoid effusion. Other: None. IMPRESSION: 1. No acute intracranial abnormality. 2. Generalized age-related cerebral atrophy with moderately advanced chronic microvascular ischemic disease. Electronically Signed   By: Rise Mu M.D.   On: 12/04/2022 20:01    Reassessment and Plan:   - CT showed generalized age-related cerebral atrophy with moderately advanced chronic microvascular ischemic disease, but no acute intracranial changes. Will obtain MRI brain w/o con for further workup of potential stroke/ischemic changes.  - MRI brain w/o con showed Age-related cerebral atrophy with moderately advanced chronic microvascular ischemic disease, mildly progressed as compared to most recent brain MRI from 01/08/2022. No acute intracranial changes.  - Care was transferred to night ED provider. Still awaiting CMP, CK, UA, ammonia. Given acute change in mental status and weakness, and family unable to care for pt at home, anticipate likely admission.    Final Clinical Impression(s) / ED Diagnoses Final diagnoses:  Altered mental status, unspecified altered mental status type    Rx / DC Orders ED Discharge Orders     None         Lincoln Brigham, MD 12/04/22 2359    Lincoln Brigham, MD 12/05/22 0000    Tegeler, Canary Brim,  MD 12/05/22 1257

## 2022-12-04 NOTE — ED Notes (Signed)
Patient transported to CT 

## 2022-12-04 NOTE — ED Triage Notes (Signed)
Pt to ER via EMS from home.  Son found pt on floor after unwitnessed fall.  Pt denies hitting head, not on thinners.  Pt  has hx of dementia, family states has been worse over last week.

## 2022-12-04 NOTE — ED Notes (Signed)
Urine collect unsuccessful, pt missed bed pan.

## 2022-12-05 ENCOUNTER — Observation Stay (HOSPITAL_COMMUNITY): Payer: Medicare PPO

## 2022-12-05 ENCOUNTER — Ambulatory Visit: Payer: Self-pay

## 2022-12-05 DIAGNOSIS — M25562 Pain in left knee: Secondary | ICD-10-CM | POA: Diagnosis not present

## 2022-12-05 DIAGNOSIS — M5000 Cervical disc disorder with myelopathy, unspecified cervical region: Secondary | ICD-10-CM | POA: Diagnosis not present

## 2022-12-05 DIAGNOSIS — M25462 Effusion, left knee: Secondary | ICD-10-CM | POA: Diagnosis not present

## 2022-12-05 DIAGNOSIS — F0150 Vascular dementia without behavioral disturbance: Secondary | ICD-10-CM | POA: Diagnosis present

## 2022-12-05 DIAGNOSIS — E7800 Pure hypercholesterolemia, unspecified: Secondary | ICD-10-CM | POA: Diagnosis not present

## 2022-12-05 DIAGNOSIS — G9340 Encephalopathy, unspecified: Secondary | ICD-10-CM | POA: Diagnosis present

## 2022-12-05 DIAGNOSIS — M170 Bilateral primary osteoarthritis of knee: Secondary | ICD-10-CM | POA: Diagnosis not present

## 2022-12-05 DIAGNOSIS — G9589 Other specified diseases of spinal cord: Secondary | ICD-10-CM | POA: Diagnosis not present

## 2022-12-05 DIAGNOSIS — M1712 Unilateral primary osteoarthritis, left knee: Secondary | ICD-10-CM | POA: Diagnosis not present

## 2022-12-05 DIAGNOSIS — I10 Essential (primary) hypertension: Secondary | ICD-10-CM | POA: Diagnosis present

## 2022-12-05 DIAGNOSIS — Z043 Encounter for examination and observation following other accident: Secondary | ICD-10-CM | POA: Diagnosis not present

## 2022-12-05 DIAGNOSIS — M5104 Intervertebral disc disorders with myelopathy, thoracic region: Secondary | ICD-10-CM | POA: Diagnosis not present

## 2022-12-05 DIAGNOSIS — M48061 Spinal stenosis, lumbar region without neurogenic claudication: Secondary | ICD-10-CM | POA: Diagnosis not present

## 2022-12-05 LAB — URINALYSIS, W/ REFLEX TO CULTURE (INFECTION SUSPECTED)
Bacteria, UA: NONE SEEN
Bilirubin Urine: NEGATIVE
Glucose, UA: 50 mg/dL — AB
Ketones, ur: 20 mg/dL — AB
Leukocytes,Ua: NEGATIVE
Nitrite: NEGATIVE
Protein, ur: 100 mg/dL — AB
Specific Gravity, Urine: 1.012 (ref 1.005–1.030)
pH: 7 (ref 5.0–8.0)

## 2022-12-05 LAB — COMPREHENSIVE METABOLIC PANEL
ALT: 18 U/L (ref 0–44)
AST: 26 U/L (ref 15–41)
Albumin: 3.9 g/dL (ref 3.5–5.0)
Alkaline Phosphatase: 55 U/L (ref 38–126)
Anion gap: 13 (ref 5–15)
BUN: 13 mg/dL (ref 8–23)
CO2: 24 mmol/L (ref 22–32)
Calcium: 9.7 mg/dL (ref 8.9–10.3)
Chloride: 97 mmol/L — ABNORMAL LOW (ref 98–111)
Creatinine, Ser: 0.68 mg/dL (ref 0.44–1.00)
GFR, Estimated: >60 mL/min (ref >60)
Glucose, Bld: 116 mg/dL — ABNORMAL HIGH (ref 70–99)
Potassium: 2.9 mmol/L — ABNORMAL LOW (ref 3.5–5.1)
Sodium: 134 mmol/L — ABNORMAL LOW (ref 135–145)
Total Bilirubin: 1.3 mg/dL — ABNORMAL HIGH (ref 0.3–1.2)
Total Protein: 7.2 g/dL (ref 6.5–8.1)

## 2022-12-05 LAB — RAPID URINE DRUG SCREEN, HOSP PERFORMED
Amphetamines: NOT DETECTED
Barbiturates: NOT DETECTED
Benzodiazepines: NOT DETECTED
Cocaine: NOT DETECTED
Opiates: POSITIVE — AB
Tetrahydrocannabinol: NOT DETECTED

## 2022-12-05 LAB — BODY FLUID CELL COUNT WITH DIFFERENTIAL
Eos, Fluid: 0 %
Lymphs, Fluid: 3 %
Monocyte-Macrophage-Serous Fluid: 21 % — ABNORMAL LOW (ref 50–90)
Neutrophil Count, Fluid: 76 % — ABNORMAL HIGH (ref 0–25)
Other Cells, Fluid: NONE SEEN %
Total Nucleated Cell Count, Fluid: 425 cu mm (ref 0–1000)

## 2022-12-05 LAB — LACTIC ACID, PLASMA
Lactic Acid, Venous: 2.6 mmol/L (ref 0.5–1.9)
Lactic Acid, Venous: 1.1 mmol/L (ref 0.5–1.9)

## 2022-12-05 LAB — C-REACTIVE PROTEIN: CRP: 14.6 mg/dL — ABNORMAL HIGH (ref <1.0)

## 2022-12-05 LAB — TSH: TSH: 1.145 u[IU]/mL (ref 0.350–4.500)

## 2022-12-05 LAB — BASIC METABOLIC PANEL
Anion gap: 12 (ref 5–15)
BUN: 12 mg/dL (ref 8–23)
CO2: 25 mmol/L (ref 22–32)
Calcium: 9.8 mg/dL (ref 8.9–10.3)
Chloride: 99 mmol/L (ref 98–111)
Creatinine, Ser: 0.71 mg/dL (ref 0.44–1.00)
GFR, Estimated: >60 mL/min (ref >60)
Glucose, Bld: 102 mg/dL — ABNORMAL HIGH (ref 70–99)
Potassium: 3 mmol/L — ABNORMAL LOW (ref 3.5–5.1)
Sodium: 136 mmol/L (ref 135–145)

## 2022-12-05 LAB — CBC
HCT: 37.4 % (ref 36.0–46.0)
Hemoglobin: 12.6 g/dL (ref 12.0–15.0)
MCH: 31.5 pg (ref 26.0–34.0)
MCHC: 33.7 g/dL (ref 30.0–36.0)
MCV: 93.5 fL (ref 80.0–100.0)
Platelets: 278 10*3/uL (ref 150–400)
RBC: 4 MIL/uL (ref 3.87–5.11)
RDW: 12.5 % (ref 11.5–15.5)
WBC: 7.2 10*3/uL (ref 4.0–10.5)
nRBC: 0 % (ref 0.0–0.2)

## 2022-12-05 LAB — SEDIMENTATION RATE: Sed Rate: 37 mm/hr — ABNORMAL HIGH (ref 0–22)

## 2022-12-05 LAB — PROCALCITONIN: Procalcitonin: <0.1 ng/mL

## 2022-12-05 LAB — CK: Total CK: 331 U/L — ABNORMAL HIGH (ref 38–234)

## 2022-12-05 LAB — AMMONIA: Ammonia: 13 umol/L (ref 9–35)

## 2022-12-05 LAB — MAGNESIUM: Magnesium: 1.7 mg/dL (ref 1.7–2.4)

## 2022-12-05 MED ORDER — ADULT MULTIVITAMIN W/MINERALS CH
1.0000 | ORAL_TABLET | Freq: Every day | ORAL | Status: DC
  Administered 2022-12-05 – 2022-12-10 (×6): 1 via ORAL
  Filled 2022-12-05 (×7): qty 1

## 2022-12-05 MED ORDER — VITAMIN D 25 MCG (1000 UNIT) PO TABS
1000.0000 [IU] | ORAL_TABLET | Freq: Every day | ORAL | Status: DC
  Administered 2022-12-06 – 2022-12-11 (×6): 1000 [IU] via ORAL
  Filled 2022-12-05 (×7): qty 1

## 2022-12-05 MED ORDER — ONDANSETRON HCL 4 MG PO TABS: 4.0000 mg | ORAL_TABLET | Freq: Four times a day (QID) | ORAL | Status: DC | PRN

## 2022-12-05 MED ORDER — POLYSACCHARIDE IRON COMPLEX 150 MG PO CAPS
150.0000 mg | ORAL_CAPSULE | Freq: Two times a day (BID) | ORAL | Status: DC
  Administered 2022-12-05 – 2022-12-11 (×13): 150 mg via ORAL
  Filled 2022-12-05 (×14): qty 1

## 2022-12-05 MED ORDER — DICLOFENAC SODIUM 1 % EX GEL
2.0000 g | Freq: Four times a day (QID) | CUTANEOUS | Status: DC | PRN
  Administered 2022-12-07: 2 g via TOPICAL
  Filled 2022-12-05: qty 100

## 2022-12-05 MED ORDER — ACETAMINOPHEN 325 MG PO TABS
650.0000 mg | ORAL_TABLET | Freq: Four times a day (QID) | ORAL | Status: DC | PRN
  Administered 2022-12-05 – 2022-12-11 (×8): 650 mg via ORAL
  Filled 2022-12-05 (×9): qty 2

## 2022-12-05 MED ORDER — SODIUM CHLORIDE 0.9 % IV BOLUS
500.0000 mL | Freq: Once | INTRAVENOUS | Status: AC
  Administered 2022-12-05: 500 mL via INTRAVENOUS

## 2022-12-05 MED ORDER — DONEPEZIL HCL 5 MG PO TABS
5.0000 mg | ORAL_TABLET | Freq: Every day | ORAL | Status: DC
  Administered 2022-12-05 – 2022-12-10 (×6): 5 mg via ORAL
  Filled 2022-12-05 (×6): qty 1

## 2022-12-05 MED ORDER — IRBESARTAN 150 MG PO TABS
300.0000 mg | ORAL_TABLET | Freq: Every day | ORAL | Status: DC
  Administered 2022-12-05 – 2022-12-06 (×2): 300 mg via ORAL
  Filled 2022-12-05: qty 2
  Filled 2022-12-05: qty 1
  Filled 2022-12-05 (×2): qty 2

## 2022-12-05 MED ORDER — TRIAMTERENE-HCTZ 37.5-25 MG PO TABS
1.0000 | ORAL_TABLET | Freq: Every day | ORAL | Status: DC
  Filled 2022-12-05: qty 1

## 2022-12-05 MED ORDER — DICLOFENAC SODIUM 1 % TD GEL: 2.0000 g | Freq: Four times a day (QID) | TRANSDERMAL | Status: DC | PRN

## 2022-12-05 MED ORDER — AMLODIPINE BESYLATE 5 MG PO TABS
5.0000 mg | ORAL_TABLET | Freq: Once | ORAL | Status: AC
  Administered 2022-12-05: 5 mg via ORAL
  Filled 2022-12-05: qty 1

## 2022-12-05 MED ORDER — ACETAMINOPHEN 650 MG RE SUPP: 650.0000 mg | Freq: Four times a day (QID) | RECTAL | Status: DC | PRN

## 2022-12-05 MED ORDER — LIDOCAINE HCL 2 % IJ SOLN
10.0000 mL | Freq: Once | INTRAMUSCULAR | Status: AC
  Administered 2022-12-05: 200 mg via INTRADERMAL
  Filled 2022-12-05: qty 20

## 2022-12-05 MED ORDER — POTASSIUM CHLORIDE CRYS ER 20 MEQ PO TBCR
40.0000 meq | EXTENDED_RELEASE_TABLET | Freq: Once | ORAL | Status: AC
  Administered 2022-12-05: 40 meq via ORAL
  Filled 2022-12-05: qty 2

## 2022-12-05 MED ORDER — DICLOFENAC SODIUM 75 MG PO TBEC
75.0000 mg | DELAYED_RELEASE_TABLET | Freq: Two times a day (BID) | ORAL | Status: DC | PRN
  Administered 2022-12-06 – 2022-12-08 (×4): 75 mg via ORAL
  Filled 2022-12-05 (×6): qty 1

## 2022-12-05 MED ORDER — LEVOTHYROXINE SODIUM 88 MCG PO TABS
88.0000 ug | ORAL_TABLET | Freq: Every day | ORAL | Status: DC
  Administered 2022-12-05 – 2022-12-11 (×6): 88 ug via ORAL
  Filled 2022-12-05 (×6): qty 1

## 2022-12-05 MED ORDER — ENOXAPARIN SODIUM 40 MG/0.4ML IJ SOSY
40.0000 mg | PREFILLED_SYRINGE | INTRAMUSCULAR | Status: DC
  Administered 2022-12-05 – 2022-12-11 (×7): 40 mg via SUBCUTANEOUS
  Filled 2022-12-05 (×7): qty 0.4

## 2022-12-05 MED ORDER — ONDANSETRON HCL 4 MG/2ML IJ SOLN: 4.0000 mg | Freq: Four times a day (QID) | INTRAMUSCULAR | Status: DC | PRN

## 2022-12-05 MED ORDER — ATORVASTATIN CALCIUM 10 MG PO TABS
20.0000 mg | ORAL_TABLET | Freq: Every day | ORAL | Status: DC
  Administered 2022-12-06 – 2022-12-11 (×6): 20 mg via ORAL
  Filled 2022-12-05 (×7): qty 2

## 2022-12-05 NOTE — Assessment & Plan Note (Addendum)
Cont statin once med rec completed.

## 2022-12-05 NOTE — Evaluation (Signed)
Physical Therapy Evaluation Patient Details Name: Lauren Garrett MRN: [id number] DOB: [birth year] Today's Date: 12/05/2022  History of Present Illness  77 y.o. female with medical history significant of HTN, HLD, osteoarthritis (B knees, L more than R), vascular dementia being followed by Dr. Pearlean Brownie.     Pt has been having ongoing progressive decline with respect to vasc dementia (increased confusion, especially at night, etc).  However, this became an acute decline starting 12/01/22 she became increasingly confused, fatigue, lethargic, had BLE weakness, and developed new inability to ambulate (normally ambulates with cane).  Dx of acute encephalopathy.  Clinical Impression  Pt admitted with above diagnosis. Pt initially agreeable to attempt sitting at edge of bed, however with minimal movement of LLE she had severe pain and was not able to tolerate further activity. Spouse present and reported pt ambulates with a cane at baseline, that the edema in her L knee has been present for awhile and that "she needs a knee replacement". RN notified of pt request for pain medication.  Pt currently with functional limitations due to the deficits listed below (see PT Problem List). Pt will benefit from acute skilled PT to increase their independence and safety with mobility to allow discharge.           If plan is discharge home, recommend the following: Two people to help with walking and/or transfers;Two people to help with bathing/dressing/bathroom;Assist for transportation;Help with stairs or ramp for entrance;Assistance with cooking/housework   Can travel by private vehicle   No    Equipment Recommendations Rolling walker (2 wheels);Wheelchair (measurements PT) (possibly WC, depending on progress)  Recommendations for Other Services       Functional Status Assessment Patient has had a recent decline in their functional status and demonstrates the ability to make significant improvements in function  in a reasonable and predictable amount of time.     Precautions / Restrictions Precautions Precautions: Fall Precaution Comments: fell just PTA Restrictions Weight Bearing Restrictions: No      Mobility  Bed Mobility Overal bed mobility: Needs Assistance Bed Mobility: Supine to Sit     Supine to sit: Total assist, +2 for physical assistance     General bed mobility comments: attemtped supine to sit, but with very minimal abduction of LLE to move towards edge of bed pt reported severe L knee pain and could not tolerate any further activity. She was not able to sit EOB.    Transfers                        Ambulation/Gait                  Stairs            Wheelchair Mobility     Tilt Bed    Modified Rankin (Stroke Patients Only)       Balance                                             Pertinent Vitals/Pain Pain Assessment Pain Assessment: Faces Faces Pain Scale: Hurts whole lot Pain Location: L knee with movement Pain Descriptors / Indicators: Grimacing, Guarding Pain Intervention(s): Limited activity within patient's tolerance, Monitored during session, Patient requesting pain meds-RN notified    Home Living Family/patient expects to be discharged to:: Private residence Living Arrangements: Spouse/significant other Available Help  at Discharge: Family;Available PRN/intermittently Type of Home: House Home Access: Stairs to enter   Entrance Stairs-Number of Steps: 4     Home Equipment: Shower seat;Cane - single point;Other (comment) Additional Comments: stair lift to second floor; spouse isn't home 24/7; hx provided by spouse    Prior Function               Mobility Comments: walks with SPC, help on stairs in/out of home ADLs Comments: independent     Extremity/Trunk Assessment   Upper Extremity Assessment Upper Extremity Assessment: Defer to OT evaluation    Lower Extremity Assessment Lower  Extremity Assessment: LLE deficits/detail LLE Deficits / Details: edema noted L knee, pt unable to tolerate any movement of LLE 2* pain LLE Sensation: WNL       Communication   Communication Communication: No apparent difficulties  Cognition Arousal: Lethargic Behavior During Therapy: WFL for tasks assessed/performed Overall Cognitive Status: Impaired/Different from baseline Area of Impairment: Orientation, Memory, Safety/judgement                 Orientation Level: Place, Time, Situation, Disoriented to             General Comments: pt was not able to state her birthdate; she was able to follow some but not all 1 step commands        General Comments      Exercises     Assessment/Plan    PT Assessment Patient needs continued PT services  PT Problem List Decreased activity tolerance;Decreased range of motion;Decreased strength;Decreased mobility;Decreased balance;Pain       PT Treatment Interventions Gait training;Therapeutic exercise;Functional mobility training;Therapeutic activities;Balance training;DME instruction    PT Goals (Current goals can be found in the Care Plan section)  Acute Rehab PT Goals Patient Stated Goal: to walk PT Goal Formulation: With family Time For Goal Achievement: 12/19/22 Potential to Achieve Goals: Fair    Frequency Min 1X/week     Co-evaluation               AM-PAC PT "6 Clicks" Mobility  Outcome Measure Help needed turning from your back to your side while in a flat bed without using bedrails?: Total Help needed moving from lying on your back to sitting on the side of a flat bed without using bedrails?: Total Help needed moving to and from a bed to a chair (including a wheelchair)?: Total Help needed standing up from a chair using your arms (e.g., wheelchair or bedside chair)?: Total Help needed to walk in hospital room?: Total Help needed climbing 3-5 steps with a railing? : Total 6 Click Score: 6    End of  Session   Activity Tolerance: Patient limited by pain Patient left: in bed;with call bell/phone within reach;with bed alarm set;with family/visitor present Nurse Communication: Mobility status PT Visit Diagnosis: Other abnormalities of gait and mobility (R26.89);Pain Pain - Right/Left: Left Pain - part of body: Knee    Time: 1610-9604 PT Time Calculation (min) (ACUTE ONLY): 12 min   Charges:   PT Evaluation $PT Eval Moderate Complexity: 1 Mod   PT General Charges $$ ACUTE PT VISIT: 1 Visit         Tamala Ser PT 12/05/2022  Acute Rehabilitation Services  Office [phone]

## 2022-12-05 NOTE — Assessment & Plan Note (Addendum)
L > R knee Large joint effusion on X ray See discussion above. Day team probably will want to call Ortho in AM depending on CT results, if she develops fever or SIRS, etc.

## 2022-12-05 NOTE — Assessment & Plan Note (Addendum)
Cont home BP meds once med rec completed. EDP gave 1x dose of norvasc: which did reduce pressure, but also has started to cause flushing it appears in the last 30 mins.

## 2022-12-05 NOTE — Patient Outreach (Signed)
  Care Coordination   12/05/2022 Name: Lauren Garrett MRN: [id number] DOB: May 14, 1945   Care Coordination Outreach Attempts:  An unsuccessful telephone outreach was attempted today to offer the patient information about available care coordination services.  Follow Up Plan:  Additional outreach attempts will be made to offer the patient care coordination information and services.   Encounter Outcome:  No Answer   Care Coordination Interventions:  No, not indicated    Delsa Sale, RN, BSN, CCM Care Management Coordinator [facility] Care Management  Direct Phone: [phone]

## 2022-12-05 NOTE — Patient Instructions (Signed)
Visit Information  Thank you for taking time to visit with me today. Please don't hesitate to contact me if I can be of assistance to you.   Following are the goals we discussed today:   Goals Addressed             This Visit's Progress    RN Care Coordination Activities: further follow up needed       Care Coordination Interventions: Evaluation of current treatment plan related to dementia and patient's adherence to plan as established by provider Received an inbound call from patient's daughter Wayna Chalet with concerns regarding her mother's current health status Determined patient is currently inpatient at [facility] with dx: acute encephalopathy  Reviewed and discussed concerns related to the potential of patient mistaking her medications, including her narcotic pain med and BP medications  Determined patient's daughter plans to discuss with her family adding additional help in the home as well as supervising and administering her medications  Encouraged daughter to discuss resource needs with the hospital SW prior to discharge to ensure a safe plan in put into place for patient's discharge home Active listening / Reflection utilized  Emotional Support Provided         Our next appointment is by telephone on 12/09/22 at 12:30 PM  Please call the care guide team at [phone] if you need to cancel or reschedule your appointment.   If you are experiencing a Mental Health or Behavioral Health Crisis or need someone to talk to, please call 1-800-273-TALK (toll free, 24 hour hotline)  Patient verbalizes understanding of instructions and care plan provided today and agrees to view in MyChart. Active MyChart status and patient understanding of how to access instructions and care plan via MyChart confirmed with patient.     Delsa Sale, RN, BSN, CCM Care Management Coordinator [facility] Care Management Direct Phone: [phone]

## 2022-12-05 NOTE — Assessment & Plan Note (Addendum)
Pt with acute increased confusion and inability to ambulate, generalized weakness. Negative or unimpressive work up thus far: CBC CMP Lactate UA MRI brain -> just shows chronic findings, nothing acute Ammonia CPK Covid, flu, RSV  Work up still pending: Magnesium TSH UDS CT L Knee Procalcitonin  PT, OT Call ortho in AM to see if they want to consider tapping the L knee to r/o septic arthritis (EDP declined). Tm 99.8, no SIRS at this time As discussed with family, I do want to see a more definite fever, or SIRS, or other finding that's more suspicious for septic arthritis before pulling the trigger on empiric antibiotics. Replace K

## 2022-12-05 NOTE — Patient Outreach (Signed)
  Care Coordination   Follow Up Visit Note   12/05/2022 Name: Lauren Garrett MRN: [id number] DOB: [birth year]  Lauren Garrett is a 77 y.o. year old female who sees Tisovec, Adelfa Koh, MD for primary care. I spoke with daughter Wayna Chalet by phone today.  What matters to the patients health and wellness today?  Daughter [name] is concerned about her mother's sudden decline in health related to her dementia.     Goals Addressed             This Visit's Progress    RN Care Coordination Activities: further follow up needed       Care Coordination Interventions: Evaluation of current treatment plan related to dementia and patient's adherence to plan as established by provider Received an inbound call from patient's daughter Wayna Chalet with concerns regarding her mother's current health status Determined patient is currently inpatient at [facility] with dx: acute encephalopathy  Reviewed and discussed concerns related to the potential of patient mistaking her medications, including her narcotic pain med and BP medications  Determined patient's daughter plans to discuss with her family adding additional help in the home as well as supervising and administering her medications  Encouraged daughter to discuss resource needs with the hospital SW prior to discharge to ensure a safe plan in put into place for patient's discharge home Active listening / Reflection utilized  Emotional Support Provided     Interventions Today    Flowsheet Row Most Recent Value  Chronic Disease   Chronic disease during today's visit Other  [acute encephalopathy]  General Interventions   General Interventions Discussed/Reviewed General Interventions Discussed, General Interventions Reviewed, Doctor Visits, Labs  Doctor Visits Discussed/Reviewed Doctor Visits Discussed, Doctor Visits Reviewed, Specialist  Education Interventions   Education Provided Provided Education  Provided Verbal Education  On When to see the doctor, Labs, Medication  Mental Health Interventions   Mental Health Discussed/Reviewed Mental Health Discussed, Mental Health Reviewed, Other  [dementia]  Pharmacy Interventions   Pharmacy Dicussed/Reviewed Pharmacy Topics Discussed, Pharmacy Topics Reviewed  Safety Interventions   Safety Discussed/Reviewed Home Safety          SDOH assessments and interventions completed:  No     Care Coordination Interventions:  Yes, provided   Follow up plan: Follow up call scheduled for 12/09/22 @12 :30 PM    Encounter Outcome:  Pt. Visit Completed

## 2022-12-05 NOTE — Progress Notes (Signed)
Seen after midnight by Dr. Julian Reil  Patient with moderate vascular dementia MMSE 19/30 in 03/2022 followed by Dr Pearlean Brownie, lumbosacral degeneration and chronic bilateral knee issues osteoarthritis of knees previously conservatively managed at [facility] orthopedics by Dr. [place] Has been seen in the past by Dr. Franky Macho to discuss repeat back surgery Previous low TSH of 0.44 with adjustment of Synthroid ANA positive no suggestion of lupus  The paresthesias that the patient has a primarily small fiber neuropathy and Aricept was discussed at last neurology office Patient was encouraged to perform cognitively challenging activities by Dr Pearlean Brownie at his last office visit 04/02/2022  It seems like patient has had progressive decline and this became acute to subacute on 8/12 patient was found on the floor sounds like she fell patient was cleaned up perked up but still tired and patient came to the ED  Knee x-ray left side showed large left effusion, CT left knee showed tricompartmental degenerative changes in the lateral compartment with moderate joint effusion and loose intra-articular bodies with stable sclerotic medullary lesion of the 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 Christell Constant of [facility] orthopedics consulted regarding effusion?  Septic arthritis-- appreciative of his care and collegiality and he performed a knee tap MRI lumbar spine suggestive of symmetrical myositis at L4-L5 therefore I consulted infectious disease and have consulted IR we will follow ESR CRP and blood cultures Antibiotics have been held under guidance of Dr. Renold Don of infectious disease to help [place] appropriately treat what may be a back infection I have discussed all of the above with the patient's son and multiple family members at the  bedside and followed up with a call to the son about the MRI L-spine findings   Pleas Koch, MD [facility] 3:06 PM

## 2022-12-05 NOTE — TOC Initial Note (Signed)
Transition of Care [facility]) - Initial/Assessment Note    Patient Details  Name: Lauren Garrett MRN: [id number] Date of Birth: 07-Dec-1945  Transition of [facility]) CM/SW Contact:    Otelia Santee, LCSW Phone Number: 12/05/2022, 3:23 PM  Clinical Narrative:                 Met with pt's spouse, son, and DIL in room to discuss recommendation for SNF. Family declined to discuss discharge planning as they are focused on what is going on with pt medically. Family requested to speak to MD. MD notified.    Expected Discharge Plan: Skilled Nursing Facility Barriers to Discharge: Family Issues, SNF Pending bed offer   Patient Goals and CMS Choice Patient states their goals for this hospitalization and ongoing recovery are:: To find out what is going on CMS Medicare.gov Compare Post Acute Care list provided to:: Patient Represenative (must comment) Choice offered to / list presented to : Spouse [place] ownership interest in [facility].provided to:: Spouse    Expected Discharge Plan and Services In-house Referral: Clinical Social Work Discharge Planning Services: NA Post Acute Care Choice: Skilled Nursing Facility Living arrangements for the past 2 months: Single Family Home                 DME Arranged: N/A DME Agency: NA                  Prior Living Arrangements/Services Living arrangements for the past 2 months: Single Family Home Lives with:: Spouse, Adult Children Patient language and need for interpreter reviewed:: Yes Do you feel safe going back to the place where you live?: Yes      Need for Family Participation in Patient Care: Yes (Comment) Care giver support system in place?: Yes (comment) Current home services: DME Criminal Activity/Legal Involvement Pertinent to Current Situation/Hospitalization: No - Comment as needed  Activities of Daily Living Home Assistive Devices/Equipment: Cane (specify quad or straight) ADL Screening (condition at  time of admission) Patient's cognitive ability adequate to safely complete daily activities?: No Is the patient deaf or have difficulty hearing?: No Does the patient have difficulty seeing, even when wearing glasses/contacts?: No Does the patient have difficulty concentrating, remembering, or making decisions?: Yes Patient able to express need for assistance with ADLs?: No Does the patient have difficulty dressing or bathing?: Yes Independently performs ADLs?: No Communication: Independent Dressing (OT): Needs assistance Is this a change from baseline?: Change from baseline, expected to last >3 days Grooming: Needs assistance Is this a change from baseline?: Change from baseline, expected to last >3 days Feeding: Needs assistance Is this a change from baseline?: Change from baseline, expected to last >3 days Bathing: Needs assistance Is this a change from baseline?: Change from baseline, expected to last >3 days Toileting: Needs assistance Is this a change from baseline?: Change from baseline, expected to last >3days In/Out Bed: Needs assistance Is this a change from baseline?: Change from baseline, expected to last >3 days Walks in Home: Independent with device (comment) Does the patient have difficulty walking or climbing stairs?: Yes Weakness of Legs: Left Weakness of Arms/Hands: None  Permission Sought/Granted   Permission granted to share information with : No              Emotional Assessment   Attitude/Demeanor/Rapport: Unable to Assess Affect (typically observed): Unable to Assess   Alcohol / Substance Use: Not Applicable Psych Involvement: No (comment)  Admission diagnosis:  Acute encephalopathy [G93.40]  Altered mental status, unspecified altered mental status type [R41.82] Patient Active Problem List   Diagnosis Date Noted   Acute encephalopathy 12/05/2022   Vascular dementia (HCC) 12/05/2022   HTN (hypertension) 12/05/2022   Acute pain of left knee  12/05/2022   Bilateral primary osteoarthritis of knee 05/31/2020   Pain in left ankle and joints of left foot 01/17/2020   Elevated blood pressure reading in office without diagnosis of hypertension 06/09/2017   Lipoma 12/28/2015   Dizziness and giddiness 09/27/2015   Tension headache 03/23/2013   Disturbance of skin sensation 02/02/2013   H/O cervical spine surgery 02/02/2013   Degeneration of lumbar or lumbosacral intervertebral disc 02/02/2013   Generalized anxiety disorder 02/02/2013   Hypercholesteremia    PCP:  Tisovec, Adelfa Koh, MD Pharmacy:   [facility] [address] [place] - [address] [place] [address] Phone: [phone] Fax: [phone]  [id number] - [place], [place] - [address] AT [facility] OF [address] [place] [address] Phone: [phone] Fax: [phone]     Social Determinants of Health (SDOH) Social History: SDOH Screenings   Food Insecurity: No Food Insecurity (12/05/2022)  Housing: Low Risk  (12/05/2022)  Transportation Needs: No Transportation Needs (12/05/2022)  Utilities: Not At Risk (12/05/2022)  Social Connections: Unknown (09/01/2021)   Received from [facility]  Tobacco Use: Low Risk  (12/04/2022)   SDOH Interventions:     Readmission Risk Interventions     No data to display

## 2022-12-05 NOTE — ED Notes (Signed)
Patient provided with graham crackers, applesauce, and water per request.

## 2022-12-05 NOTE — Consult Note (Signed)
Orthopedic Surgery Consult Note  Assessment: Patient is a 77 y.o. female with left knee pain and swelling concerning for septic arthritis   Plan: -Operative plans: pending aspirate results -Okay for diet from ortho perspective -DVT ppx: per primary -Antibiotics: per primary -Weight bearing status: as tolerated -PT evaluate and treat -Pain control -Dispo: pending aspirate results   Patient has pain in her left knee and exam concerning for septic arthritis so knee aspiration was recommended to rule out septic arthritis. After covering the risks, benefits, and alternatives, decision was made to proceed with aspiration by her family. The superolateral aspect of the knee was prepped with an alcohol based prep. A 20 gauge needle was used to enter the knee joint under standard sterile technique and 50cc of dark straw colored fluid was aspirated from the knee. Needle was withdrawn and band aid applied. Patient tolerated the procedure well. Specimen sent to the lab for further analysis.   ___________________________________________________________________________   Reason for consult: rule out left knee septic arthritis  History:  Patient is a 77 y.o. female who has a history of left knee pain and has been seen in the office for left knee pain. She has been followed by [facility] for osteoarthritis. She got admitted to [facility] for altered mental status. Patient has had bilateral knee pain within the last week. She has had acute worsening of her chronic left knee pain and family notes swelling around the knee that was not present before. Right knee is not nearly as painful as the left knee.   Review of systems: General: denies fevers and chills, myalgias Neurologic: denies recent changes in vision, slurred speech Abdomen: denies nausea, vomiting, hematemesis Respiratory: denies cough, shortness of breath  Past Medical History:  Diagnosis Date   Anemia    Arthritis     Cerebrovascular disease    Chronic kidney disease (CKD), stage III (moderate) (HCC)    Chronic pain    Depression    DJD (degenerative joint disease)    DM (diabetes mellitus) (HCC)    Hair loss    Headache    Heme positive stool    Hirsutism    Hypercholesteremia    Hyperlipidemia    Hypertension    Hypothyroidism    IBS (irritable bowel syndrome)    Other nonthrombocytopenic purpura (HCC)    Peripheral neuropathy    Preglaucoma    Spinal stenosis    Spinal stenosis    Thyroid disease    Allergies: topamax, macrobid   Past Surgical History:  Procedure Laterality Date   DILATATION & CURETTAGE/HYSTEROSCOPY WITH MYOSURE N/A 02/05/2022   Procedure: DILATATION & CURETTAGE/HYSTEROSCOPY WITH MYOSURE;  Surgeon: Gerald Leitz, MD;  Location: [facility];  Service: Gynecology;  Laterality: N/A;   NECK SURGERY     [phone]    Social History   Tobacco Use   Smoking status: Never   Smokeless tobacco: Never  Substance Use Topics   Alcohol use: No     Family history: -reviewed and not pertinent to possible septic arthritis   Physical Exam:  General: no acute distress, appears stated age Neurologic: alert, answering questions appropriately, following commands Cardiovascular: regular rate, no cyanosis Respiratory: unlabored breathing on room air, symmetric chest rise Psychiatric: appropriate affect, normal cadence to speech  MSK:   -Right lower extremity  No pain through range of motion at the knee or ankle, no palpable effusion, no pain with log roll Fires hip flexors, quadriceps, hamstrings, tibialis anterior, gastrocnemius and soleus,  extensor hallucis longus Plantarflexes and dorsiflexes toes Sensation intact to light touch in sural, saphenous, tibial, deep peroneal, and superficial peroneal nerve distributions Foot warm and well perfused  -Left lower extremity  Palpable effusion at the knee, pain with range of motion greater than 10 degrees in the  knee, no pain with log roll, no pain with passive ankle range of motion Fires hip flexors, quadriceps, hamstrings, tibialis anterior, gastrocnemius and soleus, extensor hallucis longus Plantarflexes and dorsiflexes toes Sensation intact to light touch in sural, saphenous, tibial, deep peroneal, and superficial peroneal nerve distributions Foot warm and well perfused  Imaging: XR of the left knee from 12/04/2022 was independently reviewed and interpreted, showing tricompartmental osteoarthritis. There is joint space narrowing, subchondral sclerosis, and osteophyte formation in all three compartments. No fracture or dislocation seen. Calcifications seen in the distal femur that appear unchanged from films in 2022.    Patient name: Lauren Garrett Patient MRN: [id number] Date: 12/05/22

## 2022-12-05 NOTE — H&P (Signed)
History and Physical    Patient: Lauren Garrett NUU:[id number] DOB: October 29, 1945 DOA: 12/04/2022 DOS: the patient was seen and examined on 12/05/2022 PCP: Tisovec, Adelfa Koh, MD  Patient coming from: Home  Chief Complaint:  Chief Complaint  Patient presents with   Fall   Altered Mental Status   Weakness   HPI: Lauren Garrett is a 77 y.o. female with medical history significant of HTN, HLD, osteoarthritis, vascular dementia being followed by Dr. Pearlean Garrett.  Pt has been having ongoing progressive decline with respect to vasc dementia (increased confusion, especially at night, etc).  However, this became an acute decline starting Monday.  On Monday she became increasingly confused, fatigue, lethargic, had BLE weakness, and developed new inability to ambulate (normally ambulates with cane).  She is speaking in incomplete sentences which was unusual for her. Then this morning, family found her on the floor of her room, on her back. Unclear when she fell or how she fell. Pt does not remember. Family tried to get her up for a few hours, but then had to call son from work who came and picked her up. They cleaned her up, gave her water, and reports she perked up a little but still fatigued. Then home health came to eval pt ~3pm, they called her PCP, and was recommended to come to ED. Pt has also been complaining more of R knee pain this week. Then with EMS today, they had trouble extending her L knee due to pain, so they placed a pillow under it.  Urinary incontinence at baseline.  No SOB, dysuria, congestion, CP.   Review of Systems: As mentioned in the history of present illness. All other systems reviewed and are negative. Past Medical History:  Diagnosis Date   Anemia    Arthritis    Cerebrovascular disease    Chronic kidney disease (CKD), stage III (moderate) (HCC)    Chronic pain    Depression    DJD (degenerative joint disease)    DM (diabetes mellitus) (HCC)    Hair loss     Headache    Heme positive stool    Hirsutism    Hypercholesteremia    Hyperlipidemia    Hypertension    Hypothyroidism    IBS (irritable bowel syndrome)    Other nonthrombocytopenic purpura (HCC)    Peripheral neuropathy    Preglaucoma    Spinal stenosis    Spinal stenosis    Thyroid disease    Past Surgical History:  Procedure Laterality Date   DILATATION & CURETTAGE/HYSTEROSCOPY WITH MYOSURE N/A 02/05/2022   Procedure: DILATATION & CURETTAGE/HYSTEROSCOPY WITH MYOSURE;  Surgeon: Gerald Leitz, MD;  Location: [facility] [place];  Service: Gynecology;  Laterality: N/A;   NECK SURGERY     [phone]   Social History:  reports that she has never smoked. She has never used smokeless tobacco. She reports that she does not drink alcohol and does not use drugs.  Allergies  Allergen Reactions   Other     Pt feels she is allergic to another med but unsure of name   Topamax [Topiramate] Itching   Macrobid [Nitrofurantoin Monohydrate Macrocrystals] Itching and Rash    Family History  Problem Relation Age of Onset   Dementia Mother    Lung cancer Father    Migraines Daughter    Breast cancer Neg Hx     Prior to Admission medications   Medication Sig Start Date End Date Taking? Authorizing Provider  Apoaequorin (PREVAGEN PO) Take 1 tablet  by mouth in the morning and at bedtime.    [provider]  aspirin EC 81 MG tablet Take 81 mg by mouth daily.    [provider]  atorvastatin (LIPITOR) 20 MG tablet Take 20 mg by mouth daily.    [provider]  cholecalciferol (VITAMIN D3) 25 MCG (1000 UNIT) tablet Take 1,000 Units by mouth daily.    [provider]  diclofenac (VOLTAREN) 75 MG EC tablet Take 75 mg by mouth 2 (two) times daily as needed.    [provider]  diclofenac sodium (VOLTAREN) 1 % GEL Apply 2 g topically 4 (four) times daily as needed (pain).    [provider]  donepezil (ARICEPT) 10 MG tablet Take 1 tablet (10  mg total) by mouth at bedtime. Start 1/2 tablet daily x 4 weeks and then 1 tablet daily Patient not taking: Reported on 08/05/2022 04/02/22   Micki Riley, MD  HYDROcodone-acetaminophen (NORCO/VICODIN) 5-325 MG tablet Take 1-2 tablets by mouth every 6 (six) hours as needed for moderate pain. 02/05/22   Gerald Leitz, MD  irbesartan (AVAPRO) 300 MG tablet Take 300 mg by mouth daily.  07/28/16   [provider]  iron polysaccharides (NIFEREX) 150 MG capsule Take 150 mg by mouth 2 (two) times daily. Nu iron    [provider]  Multiple Vitamin (MULTIVITAMIN WITH MINERALS) TABS Take 1 tablet by mouth daily.    [provider]  Omega-3 Fatty Acids (FISH OIL) 1200 MG CAPS Take 1 capsule by mouth daily.     [provider]  SYNTHROID 88 MCG tablet Take 88 mcg by mouth daily. 06/26/21   [provider]  tiZANidine (ZANAFLEX) 4 MG tablet Take half to one (2 to 4 mg) by mouth daily at bedtime.    [provider]  triamterene-hydrochlorothiazide (MAXZIDE-25) 37.5-25 MG per tablet Take 1 tablet by mouth daily.    [provider]    Physical Exam: Vitals:   12/05/22 0247 12/05/22 0248 12/05/22 0300 12/05/22 0303  BP: (!) 167/76  (!) 186/86 (!) 186/86  Pulse:    92  Resp:    17  Temp:  99.6 F (37.6 C)    TempSrc:  Oral    SpO2:    100%  Weight:      Height:       Constitutional: NAD, calm, comfortable Neck: normal, supple, no masses, no thyromegaly Respiratory: clear to auscultation bilaterally, no wheezing, no crackles. Normal respiratory effort. No accessory muscle use.  Cardiovascular: Regular rate and rhythm, no murmurs / rubs / gallops. No extremity edema. 2+ pedal pulses. No carotid bruits.  Abdomen: no tenderness, no masses palpated. No hepatosplenomegaly. Bowel sounds positive.  Musculoskeletal: Edema of L > R knee.  Both are about equally warm.  I dont appreciate any erythema of the L knee really, L knee is exquisitely painful with  even attempt at minor passive ROM.  This is new over past 24h per family. Skin: Onset of facial flushing in last 30 mins Neurologic: Significant BLE weakness, sensation seems to be intact though pt with difficulty describing this.  L knee swelling noted. Psychiatric: Confused, oriented to self only  Data Reviewed:    Labs on Admission: I have personally reviewed following labs and imaging studies  CBC: Recent Labs  Lab 12/04/22 1854  WBC 8.1  NEUTROABS 7.0  HGB 13.4  HCT 41.1  MCV 95.4  PLT 242   Basic Metabolic Panel: Recent Labs  Lab 12/05/22  0014  NA 134*  K 2.9*  CL 97*  CO2 24  GLUCOSE 116*  BUN 13  CREATININE 0.68  CALCIUM 9.7   GFR: Estimated Creatinine Clearance: 60.4 mL/min (by C-G formula based on SCr of 0.68 mg/dL). Liver Function Tests: Recent Labs  Lab 12/05/22 0014  AST 26  ALT 18  ALKPHOS 55  BILITOT 1.3*  PROT 7.2  ALBUMIN 3.9   No results for input(s): "LIPASE", "AMYLASE" in the last 168 hours. Recent Labs  Lab 12/05/22 0014  AMMONIA 13   Coagulation Profile: No results for input(s): "INR", "PROTIME" in the last 168 hours. Cardiac Enzymes: Recent Labs  Lab 12/05/22 0014  CKTOTAL 331*   BNP (last 3 results) No results for input(s): "PROBNP" in the last 8760 hours. HbA1C: No results for input(s): "HGBA1C" in the last 72 hours. CBG: No results for input(s): "GLUCAP" in the last 168 hours. Lipid Profile: No results for input(s): "CHOL", "HDL", "LDLCALC", "TRIG", "CHOLHDL", "LDLDIRECT" in the last 72 hours. Thyroid Function Tests: No results for input(s): "TSH", "T4TOTAL", "FREET4", "T3FREE", "THYROIDAB" in the last 72 hours. Anemia Panel: No results for input(s): "VITAMINB12", "FOLATE", "FERRITIN", "TIBC", "IRON", "RETICCTPCT" in the last 72 hours. Urine analysis:    Component Value Date/Time   COLORURINE YELLOW 12/05/2022 0021   APPEARANCEUR CLEAR 12/05/2022 0021   LABSPEC 1.012 12/05/2022 0021   PHURINE 7.0 12/05/2022 0021    GLUCOSEU 50 (A) 12/05/2022 0021   HGBUR MODERATE (A) 12/05/2022 0021   BILIRUBINUR NEGATIVE 12/05/2022 0021   KETONESUR 20 (A) 12/05/2022 0021   PROTEINUR 100 (A) 12/05/2022 0021   NITRITE NEGATIVE 12/05/2022 0021   LEUKOCYTESUR NEGATIVE 12/05/2022 0021    Radiological Exams on Admission: MR BRAIN WO CONTRAST  Result Date: 12/04/2022 CLINICAL DATA:  Initial evaluation for neuro deficit, stroke suspected. EXAM: MRI HEAD WITHOUT CONTRAST TECHNIQUE: Multiplanar, multiecho pulse sequences of the brain and surrounding structures were obtained without intravenous contrast. COMPARISON:  CT from earlier the same day as well as prior brain MRI from 01/08/2022. FINDINGS: Brain: Mild age-related cerebral atrophy. Patchy and confluent T2/FLAIR hyperintensity involving the periventricular deep white matter both cerebral hemispheres. Patchy involvement of the bilateral basal ganglia, thalami, pons, and cerebellum. Changes are most like related chronic microvascular ischemic disease, and are moderately advanced in nature, and progressed as compared to prior brain MRI. No evidence for acute or subacute ischemia. Gray-white matter differentiation maintained. No areas of chronic cortical infarction. No visible acute or chronic intracranial blood products. No mass lesion, midline shift or mass effect. No hydrocephalus or extra-axial fluid collection. Vascular: Major intracranial vascular flow voids are maintained. Skull and upper cervical spine: Craniocervical junction grossly within normal limits. Bone marrow signal intensity grossly normal. No scalp soft tissue abnormality. Sinuses/Orbits: Prior bilateral ocular lens replacement. Paranasal sinuses are largely clear. No significant mastoid effusion. Other: None. IMPRESSION: 1. No acute intracranial abnormality. 2. Age-related cerebral atrophy with moderately advanced chronic microvascular ischemic disease, mildly progressed as compared to most recent brain MRI from  01/08/2022. Electronically Signed   By: Rise Mu M.D.   On: 12/04/2022 23:35   DG Knee Complete 4 Views Left  Result Date: 12/04/2022 CLINICAL DATA:  Fall with swelling and pain EXAM: LEFT KNEE - COMPLETE 4+ VIEW COMPARISON:  05/31/2020, 07/12/2015 FINDINGS: Valgus deformity at the knee. No definitive fracture or dislocation. Stable sclerotic lesion in the distal femur consistent with benign finding. Large knee effusion. Moderate severe tricompartment arthritis of the knee worst involving the lateral and patellofemoral joint  spaces IMPRESSION: 1. No definitive acute osseous abnormality. 2. Large knee effusion. If persistent concern for fracture, would correlate with CT 3. Moderate to severe tricompartment arthritis of the knee. Electronically Signed   By: Jasmine Pang M.D.   On: 12/04/2022 20:05   DG Chest Portable 1 View  Result Date: 12/04/2022 CLINICAL DATA:  Fall with subsequent swelling and pain left knee EXAM: PORTABLE CHEST 1 VIEW COMPARISON:  07/30/2005 FINDINGS: Hardware in the cervical spine. No acute airspace disease. Normal cardiomediastinal silhouette. No pneumothorax IMPRESSION: No active disease. Electronically Signed   By: Jasmine Pang M.D.   On: 12/04/2022 20:02   CT Head Wo Contrast  Result Date: 12/04/2022 CLINICAL DATA:  Initial evaluation for acute head trauma, found down. EXAM: CT HEAD WITHOUT CONTRAST TECHNIQUE: Contiguous axial images were obtained from the base of the skull through the vertex without intravenous contrast. RADIATION DOSE REDUCTION: This exam was performed according to the departmental dose-optimization program which includes automated exposure control, adjustment of the mA and/or kV according to patient size and/or use of iterative reconstruction technique. COMPARISON:  Prior study from 01/08/2022. FINDINGS: Brain: Generalized age-related cerebral atrophy with moderately advanced chronic microvascular ischemic disease. No acute intracranial  hemorrhage. No acute large vessel territory infarct. No mass lesion or midline shift. Mild ventricular prominence related to global parenchymal volume loss without hydrocephalus. No extra-axial fluid collection. Vascular: No abnormal hyperdense vessel. Calcified atherosclerosis present at the skull base. Skull: No visible scalp soft tissue injury.  Calvarium intact. Sinuses/Orbits: Globes and orbital soft tissues within normal limits. Paranasal sinuses are clear. No mastoid effusion. Other: None. IMPRESSION: 1. No acute intracranial abnormality. 2. Generalized age-related cerebral atrophy with moderately advanced chronic microvascular ischemic disease. Electronically Signed   By: Rise Mu M.D.   On: 12/04/2022 20:01    EKG: Independently reviewed.   Assessment and Plan: * Acute encephalopathy Pt with acute increased confusion and inability to ambulate, generalized weakness. Negative or unimpressive work up thus far: CBC CMP Lactate UA MRI brain -> just shows chronic findings, nothing acute Ammonia CPK Covid, flu, RSV  Work up still pending: Magnesium TSH UDS CT L Knee Procalcitonin  PT, OT Call ortho in AM to see if they want to consider tapping the L knee to r/o septic arthritis (EDP declined). Tm 99.8, no SIRS at this time As discussed with family, I do want to see a more definite fever, or SIRS, or other finding that's more suspicious for septic arthritis before pulling the trigger on empiric antibiotics. Replace K  Vascular dementia (HCC) More chronically it sounds like pt has progressively worsening vascular dementia (sees Dr. Pearlean Garrett).  Sounds like pt has had sundowning on a more chronic basis.  Bilateral primary osteoarthritis of knee L > R knee Large joint effusion on X ray See discussion above. Day team probably will want to call Ortho in AM depending on CT results, if she develops fever or SIRS, etc.  HTN (hypertension) Cont home BP meds once med rec  completed. EDP gave 1x dose of norvasc: which did reduce pressure, but also has started to cause flushing it appears in the last 30 mins.  Hypercholesteremia Cont statin once med rec completed.      Advance Care Planning:   Code Status: Full Code Default  Consults: None, day team to call ortho in AM  Family Communication: Family at bedside  Severity of Illness: The appropriate patient status for this patient is OBSERVATION. Observation status is judged to be reasonable and  necessary in order to provide the required intensity of service to ensure the patient's safety. The patient's presenting symptoms, physical exam findings, and initial radiographic and laboratory data in the context of their medical condition is felt to place them at decreased risk for further clinical deterioration. Furthermore, it is anticipated that the patient will be medically stable for discharge from the hospital within 2 midnights of admission.   Author: Hillary Bow., DO 12/05/2022 4:05 AM  For on call review [email or web address].

## 2022-12-05 NOTE — Assessment & Plan Note (Signed)
More chronically it sounds like pt has progressively worsening vascular dementia (sees Dr. Pearlean Brownie).  Sounds like pt has had sundowning on a more chronic basis.

## 2022-12-05 NOTE — Progress Notes (Signed)
OT Cancellation Note  Patient Details Name: Lauren Garrett MRN: [id number] DOB: Sep 15, 1945   Cancelled Treatment:    Reason Eval/Treat Not Completed: Patient not medically ready. Pt with increased L knee pain and scheduled for aspiration of the knee in a few minutes, per pt's family member who was present in the room; plan for additional procedure or surgery tomorrow, depending on results from aspiration. Will continue to follow.    Reuben Likes, OTR/L 12/05/2022, 2:28 PM

## 2022-12-06 DIAGNOSIS — F01B0 Vascular dementia, moderate, without behavioral disturbance, psychotic disturbance, mood disturbance, and anxiety: Secondary | ICD-10-CM | POA: Diagnosis present

## 2022-12-06 DIAGNOSIS — M064 Inflammatory polyarthropathy: Secondary | ICD-10-CM | POA: Diagnosis present

## 2022-12-06 DIAGNOSIS — H1132 Conjunctival hemorrhage, left eye: Secondary | ICD-10-CM | POA: Diagnosis not present

## 2022-12-06 DIAGNOSIS — G311 Senile degeneration of brain, not elsewhere classified: Secondary | ICD-10-CM | POA: Diagnosis present

## 2022-12-06 DIAGNOSIS — G9589 Other specified diseases of spinal cord: Secondary | ICD-10-CM | POA: Diagnosis present

## 2022-12-06 DIAGNOSIS — T502X5A Adverse effect of carbonic-anhydrase inhibitors, benzothiadiazides and other diuretics, initial encounter: Secondary | ICD-10-CM | POA: Diagnosis not present

## 2022-12-06 DIAGNOSIS — E039 Hypothyroidism, unspecified: Secondary | ICD-10-CM | POA: Diagnosis present

## 2022-12-06 DIAGNOSIS — E860 Dehydration: Secondary | ICD-10-CM | POA: Diagnosis present

## 2022-12-06 DIAGNOSIS — E876 Hypokalemia: Secondary | ICD-10-CM | POA: Diagnosis present

## 2022-12-06 DIAGNOSIS — N1831 Chronic kidney disease, stage 3a: Secondary | ICD-10-CM | POA: Diagnosis present

## 2022-12-06 DIAGNOSIS — M25562 Pain in left knee: Secondary | ICD-10-CM | POA: Diagnosis not present

## 2022-12-06 DIAGNOSIS — I129 Hypertensive chronic kidney disease with stage 1 through stage 4 chronic kidney disease, or unspecified chronic kidney disease: Secondary | ICD-10-CM | POA: Diagnosis present

## 2022-12-06 DIAGNOSIS — M5459 Other low back pain: Secondary | ICD-10-CM | POA: Diagnosis present

## 2022-12-06 DIAGNOSIS — R296 Repeated falls: Secondary | ICD-10-CM | POA: Diagnosis present

## 2022-12-06 DIAGNOSIS — M48061 Spinal stenosis, lumbar region without neurogenic claudication: Secondary | ICD-10-CM | POA: Diagnosis present

## 2022-12-06 DIAGNOSIS — G9340 Encephalopathy, unspecified: Secondary | ICD-10-CM | POA: Diagnosis present

## 2022-12-06 DIAGNOSIS — N179 Acute kidney failure, unspecified: Secondary | ICD-10-CM | POA: Diagnosis not present

## 2022-12-06 DIAGNOSIS — E7800 Pure hypercholesterolemia, unspecified: Secondary | ICD-10-CM | POA: Diagnosis present

## 2022-12-06 DIAGNOSIS — M170 Bilateral primary osteoarthritis of knee: Secondary | ICD-10-CM | POA: Diagnosis present

## 2022-12-06 DIAGNOSIS — E1122 Type 2 diabetes mellitus with diabetic chronic kidney disease: Secondary | ICD-10-CM | POA: Diagnosis present

## 2022-12-06 DIAGNOSIS — W19XXXA Unspecified fall, initial encounter: Secondary | ICD-10-CM | POA: Diagnosis present

## 2022-12-06 DIAGNOSIS — I6782 Cerebral ischemia: Secondary | ICD-10-CM | POA: Diagnosis present

## 2022-12-06 DIAGNOSIS — Z1152 Encounter for screening for COVID-19: Secondary | ICD-10-CM | POA: Diagnosis not present

## 2022-12-06 DIAGNOSIS — M25462 Effusion, left knee: Secondary | ICD-10-CM | POA: Diagnosis present

## 2022-12-06 DIAGNOSIS — M6088 Other myositis, other site: Secondary | ICD-10-CM | POA: Diagnosis present

## 2022-12-06 DIAGNOSIS — G629 Polyneuropathy, unspecified: Secondary | ICD-10-CM | POA: Diagnosis present

## 2022-12-06 DIAGNOSIS — G9341 Metabolic encephalopathy: Secondary | ICD-10-CM | POA: Diagnosis present

## 2022-12-06 LAB — CBC WITH DIFFERENTIAL/PLATELET
Abs Immature Granulocytes: 0.01 10*3/uL (ref 0.00–0.07)
Basophils Absolute: 0 10*3/uL (ref 0.0–0.1)
Basophils Relative: 0 %
Eosinophils Absolute: 0.1 10*3/uL (ref 0.0–0.5)
Eosinophils Relative: 3 %
HCT: 36.8 % (ref 36.0–46.0)
Hemoglobin: 11.7 g/dL — ABNORMAL LOW (ref 12.0–15.0)
Immature Granulocytes: 0 %
Lymphocytes Relative: 26 %
Lymphs Abs: 1.4 10*3/uL (ref 0.7–4.0)
MCH: 31 pg (ref 26.0–34.0)
MCHC: 31.8 g/dL (ref 30.0–36.0)
MCV: 97.6 fL (ref 80.0–100.0)
Monocytes Absolute: 0.4 10*3/uL (ref 0.1–1.0)
Monocytes Relative: 8 %
Neutro Abs: 3.3 10*3/uL (ref 1.7–7.7)
Neutrophils Relative %: 63 %
Platelets: 267 10*3/uL (ref 150–400)
RBC: 3.77 MIL/uL — ABNORMAL LOW (ref 3.87–5.11)
RDW: 13 % (ref 11.5–15.5)
WBC: 5.3 10*3/uL (ref 4.0–10.5)
nRBC: 0 % (ref 0.0–0.2)

## 2022-12-06 LAB — BASIC METABOLIC PANEL
Anion gap: 9 (ref 5–15)
BUN: 19 mg/dL (ref 8–23)
CO2: 28 mmol/L (ref 22–32)
Calcium: 10 mg/dL (ref 8.9–10.3)
Chloride: 102 mmol/L (ref 98–111)
Creatinine, Ser: 0.82 mg/dL (ref 0.44–1.00)
GFR, Estimated: >60 mL/min (ref >60)
Glucose, Bld: 90 mg/dL (ref 70–99)
Potassium: 3.5 mmol/L (ref 3.5–5.1)
Sodium: 139 mmol/L (ref 135–145)

## 2022-12-06 NOTE — Progress Notes (Signed)
HOSPITALIST ROUNDING NOTE Lauren Garrett VQQ:[id number]  DOB: [birth year]  DOA: 12/04/2022  PCP: Gaspar Garbe, MD  12/06/2022,1:56 PM   LOS: 0 days      Code Status: Full   From: Home  current Dispo: Probable skilled facility in several days     76 home dwelling with husband  moderate vascular dementia MMSE 19/30 in 03/2022 followed by Dr Pearlean Brownie, lumbosacral degeneration and chronic bilateral knee issues osteoarthritis of knees previously conservatively managed at [facility] orthopedics by Dr. [place] Has been seen in the past by Dr. Franky Macho to discuss repeat back surgery Previous low TSH of 0.44 with adjustment of Synthroid ANA positive no suggestion of lupus   The paresthesias that the patient has a primarily small fiber neuropathy and Aricept was discussed at last neurology office Patient was encouraged to perform cognitively challenging activities by Dr Pearlean Brownie at his last office visit 04/02/2022   It seems like patient has had progressive decline and this became acute to subacute on 8/12 patient was found on the floor sounds like she fell patient was cleaned up perked up but still tired and patient came to the ED   Knee x-ray left side showed large left effusion, CT left knee showed tricompartmental degenerative changes in the lateral compartment with moderate joint effusion and loose intra-articular bodies with 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 Christell Constant of [facility] orthopedics consulted regarding effusion?  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 Christell Constant continued diligence and input Mobilize with therapy-daughter supportive of patient going to skilled we will ask TOC to see and discuss further with family  Degenerative changes-a question of myositis in the lower back on MRI Lumbosacral degeneration followed by Dr. Franky Macho Discussed with Dr. Elby Showers on epic chat on 8/16 and he over read the images and did not feel that the patient had any specific abscess although the report said "gas and fluid" As patient has a negative straight leg raise and no severe pain I feel it is low yield for any type of procedure and I do not think patient has a back infection given low ESR CRP Outpatient follow-up Dr. Franky Macho supportive management with diclofenac 75 twice daily as needed moderate pain-May also use topical Voltaren gel for pain  Severe hypokalemia on admit Replaced and resolved-no further need of replacement  HTN Continue Maxide 1 daily-do not aggressively control  Vascular dementia followed by Dr Pearlean Brownie Needs repeat MMSE-last 1 done was 19/30-was not taking Aricept-have started it here and she has suffered no side effects  Hypothyroidism Continue Synthroid 88 Requires TSH in about 4 weeks   DVT prophylaxis: Lovenox  Status is: Observation The patient will require care spanning > 2 midnights and should be moved to inpatient because: Requires skilled    Subjective: Confused Seems improved to daughter overall however  Objective + exam Vitals:   12/05/22 2115 12/06/22 0142 12/06/22 0623 12/06/22 1008  BP: (!) 132/52 (!) 153/77 (!) 112/95 132/77  Pulse: 90 86 82 85  Resp: 16 18 18 16   Temp: 98.7 F (37.1 C) 98 F (36.7 C) 98.2 F (  36.8 C) 98.2 F (36.8 C)  TempSrc: Oral Oral Oral Oral  SpO2: 100% 100% 98% 100%  Weight:      Height:       Filed Weights   12/04/22 1752 12/05/22 0520  Weight: 62 kg 59.7 kg    Examination:  EOMI NCAT no focal deficit no icterus no pallor no wheeze no rales rhonchi Chest  clear S1-S2 no murmur ROM intact CTAB no added sound Straight leg raise bilaterally equal  Data Reviewed: reviewed   CBC    Component Value Date/Time   WBC 5.3 12/06/2022 0635   RBC 3.77 (L) 12/06/2022 0635   HGB 11.7 (L) 12/06/2022 0635   HCT 36.8 12/06/2022 0635   PLT 267 12/06/2022 0635   MCV 97.6 12/06/2022 0635   MCH 31.0 12/06/2022 0635   MCHC 31.8 12/06/2022 0635   RDW 13.0 12/06/2022 0635   LYMPHSABS 1.4 12/06/2022 0635   MONOABS 0.4 12/06/2022 0635   EOSABS 0.1 12/06/2022 0635   BASOSABS 0.0 12/06/2022 0635      Latest Ref Rng & Units 12/06/2022    6:35 AM 12/05/2022    7:22 AM 12/05/2022   12:14 AM  CMP  Glucose 70 - 99 mg/dL 90  161  096   BUN 8 - 23 mg/dL 19  12  13    Creatinine 0.44 - 1.00 mg/dL 0.45  4.09  8.11   Sodium 135 - 145 mmol/L 139  136  134   Potassium 3.5 - 5.1 mmol/L 3.5  3.0  2.9   Chloride 98 - 111 mmol/L 102  99  97   CO2 22 - 32 mmol/L 28  25  24    Calcium 8.9 - 10.3 mg/dL 91.4  9.8  9.7   Total Protein 6.5 - 8.1 g/dL   7.2   Total Bilirubin 0.3 - 1.2 mg/dL   1.3   Alkaline Phos 38 - 126 U/L   55   AST 15 - 41 U/L   26   ALT 0 - 44 U/L   18      Scheduled Meds:  atorvastatin  20 mg Oral Daily   cholecalciferol  1,000 Units Oral Daily   donepezil  5 mg Oral QHS   enoxaparin (LOVENOX) injection  40 mg Subcutaneous Q24H   irbesartan  300 mg Oral Daily   iron polysaccharides  150 mg Oral BID   levothyroxine  88 mcg Oral Q0600   multivitamin with minerals  1 tablet Oral Daily   Continuous Infusions:  Time 40  Rhetta Mura, MD  [facility]

## 2022-12-06 NOTE — Progress Notes (Addendum)
Orthopedic Surgery Progress Note   Assessment: Patient is a 77 y.o. female with left knee pain and effusion. Consulted to rule out septic arthritis. Aspirate results are not consistent with septic arthritis. More likely OA flare.    Plan: -Operative plans: none -Okay for diet from orthopedic perspective  -DVT ppx: per primary -Weight bearing status: as tolerated -PT evaluate and treat -Pain control -Will continue to follow the aspirate cultures. If culture is positive, may warrant I&D but her exam and other lab values point against septic arthritis at this time -Dispo: per primary  ___________________________________________________________________________  Subjective: No acute events overnight. Patient's mentation better this morning. Knee pain has improved since the aspiration Was able to get some sleep yesterday.    Physical Exam:  General: no acute distress, appears stated age Neurologic: alert, answering questions appropriately, following commands Respiratory: unlabored breathing on room air, symmetric chest rise Psychiatric: appropriate affect, normal cadence to speech  MSK:   -Left lower extremity  Small effusion at the knee, no pain with log roll, able to tolerate 50 degrees of passive range of motion at the knee before having pain, no pain with passive ankle range of motion EHL/GSC/TA intact Plantarflexes and dorsiflexes toes Sensation intact to light touch in sural, saphenous, tibial, deep peroneal, and superficial peroneal nerve distributions Foot warm and well perfused   I ordered and reviewed the left knee synovial cell count, gram stain and culture, and the ESR/CRP. ESR and CRP were 37 and 14.6, respectively. Gram stain was negative. Cell count was 425 TNC.   I spent reviewing the results of the aspirate, examining the patient, and then discussing the orthopedic plan with the patient, her daughter, and her husband (on the phone). Answered their questions  about the lab results, what medications she can use as an outpatient to treat the pain, and helped them try to figure out when the right time is for a knee replacement.   Patient name: Lauren Garrett Patient MRN: [id number] Date: 12/06/22

## 2022-12-06 NOTE — Evaluation (Signed)
Occupational Therapy Evaluation Patient Details Name: Lauren Garrett MRN: [id number] DOB: 1945-09-24 Today's Date: 12/06/2022   History of Present Illness 77 y.o. female with medical history significant of HTN, HLD, osteoarthritis (B knees, L more than R), vascular dementia being followed by Dr. Pearlean Brownie.     Pt has been having ongoing progressive decline with respect to vasc dementia (increased confusion, especially at night, etc).  However, this became an acute decline starting 12/01/22 she became increasingly confused, fatigue, lethargic, had BLE weakness, and developed new inability to ambulate (normally ambulates with cane).  Dx of acute encephalopathy.   Clinical Impression   Patient is a 77 year old female who was admitted for above. Patient was living at home with husband with family support prior level. Currently, patient is +2 for movement with TD for LB Dressing with increased pain and edema in LLE impacting participation. Patient has increased difficulty with communication of wants/needs with daughter present in room attempting to assist. Patient was noted to have decreased functional activity tolernace, decreased ROM, decreased BUE strength, decreased endurance, decreased sitting balance, decreased standing balanced, decreased safety awareness, and decreased knowledge of AE/AD impacting participation in ADLs. Patient will benefit from continued inpatient follow up therapy, <3 hours/day        If plan is discharge home, recommend the following: Two people to help with walking and/or transfers;A lot of help with bathing/dressing/bathroom;Assistance with cooking/housework;Direct supervision/assist for medications management;Assist for transportation;Help with stairs or ramp for entrance;Direct supervision/assist for financial management;Assistance with feeding;Supervision due to cognitive status    Functional Status Assessment  Patient has had a recent decline in their functional status  and/or demonstrates limited ability to make significant improvements in function in a reasonable and predictable amount of time  Equipment Recommendations  None recommended by OT       Precautions / Restrictions Precautions Precautions: Fall Precaution Comments: Hx Dementia Restrictions Weight Bearing Restrictions: No      Mobility Bed Mobility Overal bed mobility: Needs Assistance Bed Mobility: Sit to Supine       Sit to supine: +2 for physical assistance, Total assist, +2 for safety/equipment                    Balance Overall balance assessment: Needs assistance   Sitting balance-Leahy Scale: Poor Sitting balance - Comments: posterior leaning when sitting on edge of chair with patient seeking for back of chair. Postural control: Posterior lean   Standing balance-Leahy Scale: Zero Standing balance comment: required physical A to maintian standing           ADL either performed or assessed with clinical judgement   ADL Overall ADL's : Needs assistance/impaired Eating/Feeding: Minimal assistance;Sitting   Grooming: Sitting;Minimal assistance   Upper Body Bathing: Moderate assistance;Sitting   Lower Body Bathing: Total assistance;Sitting/lateral leans   Upper Body Dressing : Moderate assistance;Sitting;Cueing for sequencing;Cueing for safety   Lower Body Dressing: Maximal assistance;Sitting/lateral leans;Cueing for back precautions;Cueing for safety   Toilet Transfer: +2 for physical assistance;+2 for safety/equipment;Maximal assistance Toilet Transfer Details (indicate cue type and reason): to weight shift to advance BLE to EOB from recliner in room. patient was able to minimally move feet with TD to maintain standing balance Toileting- Clothing Manipulation and Hygiene: Total assistance;Bed level               Vision   Vision Assessment?: No apparent visual deficits            Pertinent Vitals/Pain Pain Assessment Pain Assessment:  Faces Faces Pain Scale: Hurts whole lot Pain Location: indicating right knee with rubbing Pain Descriptors / Indicators: Grimacing, Guarding Pain Intervention(s): Limited activity within patient's tolerance, Monitored during session, Repositioned, Patient requesting pain meds-RN notified     Extremity/Trunk Assessment Upper Extremity Assessment Upper Extremity Assessment: Difficult to assess due to impaired cognition   Lower Extremity Assessment Lower Extremity Assessment: Defer to PT evaluation   Cervical / Trunk Assessment Cervical / Trunk Assessment: Kyphotic   Communication Communication Communication: Difficulty communicating thoughts/reduced clarity of speech;Difficulty following commands/understanding Following commands: Follows one step commands inconsistently Cueing Techniques: Verbal cues;Gestural cues;Tactile cues;Visual cues   Cognition Arousal: Alert Behavior During Therapy: Restless Overall Cognitive Status: History of cognitive impairments - at baseline           General Comments: patient having difficulty with attending to one command at a time. patient was easily distracted on blankets, gown and other things in room. daughter was present during session and mediate at times. patient has increased difficulty finding words and maintains restlessness during session.                Home Living Family/patient expects to be discharged to:: Private residence Living Arrangements: Spouse/significant other Available Help at Discharge: Family;Available PRN/intermittently Type of Home: House Home Access: Stairs to enter Entrance Stairs-Number of Steps: 4   Home Layout: Two level Alternate Level Stairs-Number of Steps: with chair lift   Bathroom Shower/Tub: Walk-in shower         Home Equipment: Shower seat;Cane - single point;Other (comment)   Additional Comments: daughter was present during session and able to provide some PLOF info      Prior  Functioning/Environment Prior Level of Function : Needs assist             Mobility Comments: walks with SPC, help on stairs in/out of home ADLs Comments: independent        OT Problem List: Decreased coordination;Decreased safety awareness;Decreased knowledge of precautions;Decreased knowledge of use of DME or AE;Pain;Impaired balance (sitting and/or standing);Decreased activity tolerance;Decreased cognition      OT Treatment/Interventions: Self-care/ADL training;DME and/or AE instruction;Therapeutic activities;Patient/family education;Balance training    OT Goals(Current goals can be found in the care plan section) Acute Rehab OT Goals Patient Stated Goal: none stated OT Goal Formulation: With family Time For Goal Achievement: 12/20/22 Potential to Achieve Goals: Fair  OT Frequency: Min 1X/week       AM-PAC OT "6 Clicks" Daily Activity     Outcome Measure Help from another person eating meals?: A Little Help from another person taking care of personal grooming?: A Little Help from another person toileting, which includes using toliet, bedpan, or urinal?: Total Help from another person bathing (including washing, rinsing, drying)?: Total Help from another person to put on and taking off regular upper body clothing?: A Little Help from another person to put on and taking off regular lower body clothing?: Total 6 Click Score: 12   End of Session Equipment Utilized During Treatment: Gait belt;Rolling walker (2 wheels) Nurse Communication: Mobility status  Activity Tolerance: Patient limited by fatigue;Patient limited by pain Patient left: in bed;with call bell/phone within reach;with bed alarm set;with family/visitor present;with nursing/sitter in room  OT Visit Diagnosis: Unsteadiness on feet (R26.81);Other abnormalities of gait and mobility (R26.89);Pain Pain - Right/Left: Left Pain - part of body: Leg                Time: 4034-7425 OT Time Calculation (min): 44  min Charges:  OT  General Charges $OT Visit: 1 Visit OT Evaluation $OT Eval Low Complexity: 1 Low OT Treatments $Self Care/Home Management : 23-37 mins  Kevante Lunt OTR/L, MS Acute Rehabilitation Department Office# [phone]   Selinda Flavin 12/06/2022, 4:23 PM

## 2022-12-06 NOTE — Discharge Instructions (Signed)
Orthopedic Surgery Discharge Instructions  Patient name: Lauren Garrett Diagnosis: left knee osteoarthritis flare  Follow Up Appointments: You should call the office to schedule a follow up appointment with either Dr. Roda Shutters or Dr. Magnus Ivan. Both of these doctors perform joint replacement surgery of the hips and knees. The office phone number and location is listed below.   Office Information:  -Phone number: [phone] -Address: [address]       [place], [place] [address]

## 2022-12-06 NOTE — Progress Notes (Signed)
Physical Therapy Treatment Patient Details Name: Lauren Garrett MRN: [id number] DOB: [birth year] Today's Date: 12/06/2022   History of Present Illness 77 y.o. female with medical history significant of HTN, HLD, osteoarthritis (B knees, L more than R), vascular dementia being followed by Dr. Pearlean Brownie.     Pt has been having ongoing progressive decline with respect to vasc dementia (increased confusion, especially at night, etc).  However, this became an acute decline starting 12/01/22 she became increasingly confused, fatigue, lethargic, had BLE weakness, and developed new inability to ambulate (normally ambulates with cane).  Dx of acute encephalopathy.    PT Comments  General Comments: Pt is AxO x 1 with difficulty following even one step simple commands.  Easily distracted.  Requires constant redirection to stay on task.  Fidgeting.  Make good eye contact and attempts to express but struggles with appropriate words. Pt was pre medicated prior to session. Daughter was present. Assisted OOB was difficult.  General bed mobility comments: pt unable to intiate and complete due to cognition/dementia.  Required Total Assist with use of bed pad to complete transition to EOB.  Once EOB, required Mod/Min Asisst to prevent posterior LOB.  Poor self coorection. General transfer comment: pt was unable to initiate or complete a tradittional sit to stand due to impaired cognition/dementia and fear.  Pt required Total Assist + 2 "Bear Hug" in front and "hip guide" in back to complete 1/4 pivot to [facility].  Poor flexed standing posture and inability to self correct.  Pt was unable to support her weight.  Assisted off BSC same style with pt only partially upright.  biggest barrier impaired cognition with inabilty to follow commands as well as poor self awareness/fear of falling.   Daughter stated, Pt's dementia has been progressing.  Per chart review, family declines SNF.  Pt will need 24/7 care.    If plan is discharge  home, recommend the following: Two people to help with walking and/or transfers;Two people to help with bathing/dressing/bathroom;Assist for transportation;Help with stairs or ramp for entrance;Assistance with cooking/housework   Can travel by private vehicle     No  Equipment Recommendations  None recommended by PT    Recommendations for Other Services       Precautions / Restrictions Precautions Precautions: Fall Precaution Comments: Hx Dementia Restrictions Weight Bearing Restrictions: No     Mobility  Bed Mobility Overal bed mobility: Needs Assistance Bed Mobility: Supine to Sit     Supine to sit: Total assist, +2 for physical assistance     General bed mobility comments: pt unable to intiate and complete due to cognition/dementia.  Required Total Assist with use of bed pad to complete transition to EOB.  Once EOB, required Mod/Min Asisst to prevent posterior LOB.  Poor self coorection.    Transfers Overall transfer level: Needs assistance Equipment used: 2 person hand held assist Transfers: Bed to chair/wheelchair/BSC   Stand pivot transfers: +2 physical assistance, +2 safety/equipment, From elevated surface, Total assist         General transfer comment: pt was unable to initiate or complete a tradittional sit to stand due to impaired cognition/dementia and fear.  Pt required Total Assist + 2 "Bear Hug" in front and "hip guide" in back to complete 1/4 pivot to [facility].  Poor flexed posture and inability to self correct.  Assisted off BSC same style with pt only partially upright.  biggest barrier impaired cognition with inabilty to follow commands as well as poor self awareness.  Ambulation/Gait               General Gait Details: transfer only   Optometrist     Tilt Bed    Modified Rankin (Stroke Patients Only)       Balance                                            Cognition Arousal:  Alert Behavior During Therapy: Restless Overall Cognitive Status: History of cognitive impairments - at baseline                                 General Comments: Pt is AxO x 1 with difficulty following even one step simple commands.  Easily distracted.  Requires constant redirection to stay on task.  Fidgeting.  Make good eye contact and attempts to express but struggles with appropriate words.        Exercises      General Comments        Pertinent Vitals/Pain Pain Assessment Faces Pain Scale: Hurts a little bit Pain Location: indicating right knee with rubbing Pain Descriptors / Indicators: Grimacing, Guarding    Home Living                          Prior Function            PT Goals (current goals can now be found in the care plan section) Progress towards PT goals: Progressing toward goals    Frequency    Min 1X/week      PT Plan      Co-evaluation              AM-PAC PT "6 Clicks" Mobility   Outcome Measure  Help needed turning from your back to your side while in a flat bed without using bedrails?: Total Help needed moving from lying on your back to sitting on the side of a flat bed without using bedrails?: Total Help needed moving to and from a bed to a chair (including a wheelchair)?: Total Help needed standing up from a chair using your arms (e.g., wheelchair or bedside chair)?: Total Help needed to walk in hospital room?: Total Help needed climbing 3-5 steps with a railing? : Total 6 Click Score: 6    End of Session Equipment Utilized During Treatment: Gait belt Activity Tolerance: Other (comment) (limited due to cognition/dementia) Patient left: in chair;with call bell/phone within reach;with family/visitor present Nurse Communication: Mobility status;Need for lift equipment PT Visit Diagnosis: Other abnormalities of gait and mobility (R26.89);Pain Pain - Right/Left: Right Pain - part of body: Knee     Time:  6578-4696 PT Time Calculation (min) (ACUTE ONLY): 24 min  Charges:    $Therapeutic Activity: 23-37 mins PT General Charges $$ ACUTE PT VISIT: 1 Visit                     Felecia Shelling  PTA Acute  Rehabilitation Services Office M-F          [phone]

## 2022-12-06 NOTE — Plan of Care (Signed)
  Problem: Health Behavior/Discharge Planning: Goal: Ability to manage health-related needs will improve Outcome: Progressing   Problem: Clinical Measurements: Goal: Ability to maintain clinical measurements within normal limits will improve Outcome: Progressing Goal: Will remain free from infection Outcome: Progressing Goal: Diagnostic test results will improve Outcome: Progressing Goal: Respiratory complications will improve Outcome: Progressing Goal: Cardiovascular complication will be avoided Outcome: Progressing   Problem: Activity: Goal: Risk for activity intolerance will decrease Outcome: Progressing   Problem: Nutrition: Goal: Adequate nutrition will be maintained Outcome: Progressing   Problem: Coping: Goal: Level of anxiety will decrease Outcome: Progressing   Problem: Elimination: Goal: Will not experience complications related to bowel motility Outcome: Progressing Goal: Will not experience complications related to urinary retention Outcome: Progressing   Problem: Pain Managment: Goal: General experience of comfort will improve Outcome: Progressing   Problem: Safety: Goal: Ability to remain free from injury will improve Outcome: Progressing   Problem: Skin Integrity: Goal: Risk for impaired skin integrity will decrease Outcome: Progressing   Problem: Education: Goal: Knowledge of General Education information will improve Description: Including pain rating scale, medication(s)/side effects and non-pharmacologic comfort measures Outcome: Not Progressing   

## 2022-12-07 DIAGNOSIS — G9340 Encephalopathy, unspecified: Secondary | ICD-10-CM | POA: Diagnosis not present

## 2022-12-07 LAB — BASIC METABOLIC PANEL
Anion gap: 9 (ref 5–15)
BUN: 32 mg/dL — ABNORMAL HIGH (ref 8–23)
CO2: 27 mmol/L (ref 22–32)
Calcium: 9.8 mg/dL (ref 8.9–10.3)
Chloride: 101 mmol/L (ref 98–111)
Creatinine, Ser: 1.2 mg/dL — ABNORMAL HIGH (ref 0.44–1.00)
GFR, Estimated: 48 mL/min — ABNORMAL LOW (ref >60)
Glucose, Bld: 83 mg/dL (ref 70–99)
Potassium: 3.5 mmol/L (ref 3.5–5.1)
Sodium: 137 mmol/L (ref 135–145)

## 2022-12-07 LAB — CBC WITH DIFFERENTIAL/PLATELET
Abs Immature Granulocytes: 0.02 10*3/uL (ref 0.00–0.07)
Basophils Absolute: 0 10*3/uL (ref 0.0–0.1)
Basophils Relative: 1 %
Eosinophils Absolute: 0.2 10*3/uL (ref 0.0–0.5)
Eosinophils Relative: 3 %
HCT: 32.6 % — ABNORMAL LOW (ref 36.0–46.0)
Hemoglobin: 10.5 g/dL — ABNORMAL LOW (ref 12.0–15.0)
Immature Granulocytes: 0 %
Lymphocytes Relative: 26 %
Lymphs Abs: 1.2 10*3/uL (ref 0.7–4.0)
MCH: 31.2 pg (ref 26.0–34.0)
MCHC: 32.2 g/dL (ref 30.0–36.0)
MCV: 96.7 fL (ref 80.0–100.0)
Monocytes Absolute: 0.4 10*3/uL (ref 0.1–1.0)
Monocytes Relative: 8 %
Neutro Abs: 3 10*3/uL (ref 1.7–7.7)
Neutrophils Relative %: 62 %
Platelets: 291 10*3/uL (ref 150–400)
RBC: 3.37 MIL/uL — ABNORMAL LOW (ref 3.87–5.11)
RDW: 12.8 % (ref 11.5–15.5)
WBC: 4.8 10*3/uL (ref 4.0–10.5)
nRBC: 0 % (ref 0.0–0.2)

## 2022-12-07 LAB — URIC ACID, BODY FLUID: Uric Acid Body Fluid: 3.5 mg/dL

## 2022-12-07 MED ORDER — SODIUM CHLORIDE 0.9 % IV SOLN: INTRAVENOUS | Status: DC

## 2022-12-07 NOTE — Progress Notes (Signed)
HOSPITALIST ROUNDING NOTE REDITH MACIA ZOX:[id number]  DOB: 08/13/1945  DOA: 12/04/2022  PCP: Gaspar Garbe, MD  12/07/2022,10:18 AM   LOS: 1 day      Code Status: Full   From: Home  current Dispo: Probable skilled facility in several days     76 home dwelling with husband  moderate vascular dementia MMSE 19/30 in 03/2022 followed by Dr Pearlean Brownie, lumbosacral degeneration and chronic bilateral knee issues osteoarthritis of knees previously conservatively managed at [facility] orthopedics by Dr. [place] Has been seen in the past by Dr. Franky Macho to discuss repeat back surgery Previous low TSH of 0.44 with adjustment of Synthroid ANA positive no suggestion of lupus   The paresthesias that the patient has a primarily small fiber neuropathy and Aricept was discussed at last neurology office Patient was encouraged to perform cognitively challenging activities by Dr Pearlean Brownie at his last office visit 04/02/2022   It seems like patient has had progressive decline and this became acute to subacute on 8/12 patient was found on the floor sounds like she fell patient was cleaned up perked up but still tired and patient came to the ED   Knee x-ray left side showed large left effusion, CT left knee showed tricompartmental degenerative changes in the lateral compartment with moderate joint effusion and loose intra-articular bodies with 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 Christell Constant of [facility] orthopedics consulted regarding effusion?  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 Franky Macho D/w IR epic chat on 8/16  did not feel that the patient had any specific abscess although the report said "gas and fluid"--do not think patient has a back infection CRP/ESR non specific--no overt fever Outpatient follow-up Dr. Franky Macho supportive management with p.o. diclofenac 75 twice daily as needed moderate pain--topical Voltaren gel for pain  Severe hypokalemia on admit Replaced and resolved-no further need of replacement  HTN Stop avapro 8/18  AKI Likely 2/2 ARB/Poor PO intake Start IVF 75 cc/h and monitor trends of creatinine over the next 24 hours Force oral fluids  Vascular dementia followed by Dr Pearlean Brownie Needs repeat MMSE-last 1 done was 19/30-was not taking Aricept-have started it here and she has suffered no side effects  Hypothyroidism Continue Synthroid 88 Requires TSH in about 4 weeks   DVT prophylaxis: Lovenox  Status is: Observation The patient will require care spanning > 2 midnights and should be moved to inpatient because:  ?Requires skilled    Subjective:  Pleasantly confused quite sleepy today No overt concerns Nursing informed to force fluids  Objective + exam Vitals:   12/06/22 1008 12/06/22 1649 12/06/22 2023 12/07/22 0437  BP: 132/77 110/60 (!) 127/55 132/62  Pulse: 85 73 72 61  Resp: 16 14 18    Temp: 98.2 F (36.8 C) 98.1 F (36.7 C) 98.5 F (36.9 C) 97.8 F (36.6 C)  TempSrc: Oral Oral Oral Oral  SpO2: 100% 99% 100% 100%  Weight:      Height:  Filed Weights   12/04/22 1752 12/05/22 0520  Weight: 62 kg 59.7 kg    Examination:  Slightly sleepy cachectic black female no distress rouses readily Chest clear no wheeze Abdomen soft S1-S2 no murmur Confused which is baseline  Data Reviewed: reviewed   CBC    Component Value Date/Time   WBC 4.8 12/07/2022 0546   RBC  3.37 (L) 12/07/2022 0546   HGB 10.5 (L) 12/07/2022 0546   HCT 32.6 (L) 12/07/2022 0546   PLT 291 12/07/2022 0546   MCV 96.7 12/07/2022 0546   MCH 31.2 12/07/2022 0546   MCHC 32.2 12/07/2022 0546   RDW 12.8 12/07/2022 0546   LYMPHSABS 1.2 12/07/2022 0546   MONOABS 0.4 12/07/2022 0546   EOSABS 0.2 12/07/2022 0546   BASOSABS 0.0 12/07/2022 0546      Latest Ref Rng & Units 12/07/2022    5:46 AM 12/06/2022    6:35 AM 12/05/2022    7:22 AM  CMP  Glucose 70 - 99 mg/dL 83  90  161   BUN 8 - 23 mg/dL 32  19  12   Creatinine 0.44 - 1.00 mg/dL 0.96  0.45  4.09   Sodium 135 - 145 mmol/L 137  139  136   Potassium 3.5 - 5.1 mmol/L 3.5  3.5  3.0   Chloride 98 - 111 mmol/L 101  102  99   CO2 22 - 32 mmol/L 27  28  25    Calcium 8.9 - 10.3 mg/dL 9.8  81.1  9.8      Scheduled Meds:  atorvastatin  20 mg Oral Daily   cholecalciferol  1,000 Units Oral Daily   donepezil  5 mg Oral QHS   enoxaparin (LOVENOX) injection  40 mg Subcutaneous Q24H   iron polysaccharides  150 mg Oral BID   levothyroxine  88 mcg Oral Q0600   multivitamin with minerals  1 tablet Oral Daily   Continuous Infusions:  sodium chloride      Time 25  Rhetta Mura, MD  [facility]

## 2022-12-07 NOTE — Progress Notes (Signed)
A consult was placed to the IV Nurse for new IV access; pt not allowing a new IV to be placed unless her her husband was at the bedside;  will try to come back later to restart the patient's IV.

## 2022-12-07 NOTE — Plan of Care (Signed)
Orthopedic Plan of Care Note  Aspirate results checked this morning. No organisms growing on culture. No crystals seen in the body fluid cell count lab. Total cell count of 425. No operative plans at this time. Will continue to follow the cultures. Based on current results, appears to be consistent with osteoarthritis flare. Recommend symptomatic treatment and patient can weight bear as tolerated.    London Sheer, MD Orthopedic Surgeon

## 2022-12-07 NOTE — TOC Progression Note (Signed)
Transition of Care [facility]) - Progression Note    Patient Details  Name: Lauren Garrett MRN: [id number] Date of Birth: 03/12/1946  Transition of Care [facility]) CM/SW Contact  Georgie Chard, [place] Phone Number: 12/07/2022, 12:14 PM  Clinical Narrative:    This CSW has reached out to patient's husband to speak about DC plan. AT this time the husband has requested to speak about this matter on Monday the 18 th. TOC will follow up tomorrow with family in regards to DC plan for patient.    Expected Discharge Plan: Skilled Nursing Facility Barriers to Discharge: Family Issues, SNF Pending bed offer  Expected Discharge Plan and Services In-house Referral: Clinical Social Work Discharge Planning Services: NA Post Acute Care Choice: Skilled Nursing Facility Living arrangements for the past 2 months: Single Family Home                 DME Arranged: N/A DME Agency: NA                   Social Determinants of Health (SDOH) Interventions SDOH Screenings   Food Insecurity: No Food Insecurity (12/05/2022)  Housing: Low Risk  (12/05/2022)  Transportation Needs: No Transportation Needs (12/05/2022)  Utilities: Not At Risk (12/05/2022)  Social Connections: Unknown (09/01/2021)   Received from [facility]  Tobacco Use: Low Risk  (12/04/2022)    Readmission Risk Interventions     No data to display

## 2022-12-07 NOTE — Plan of Care (Signed)

## 2022-12-08 DIAGNOSIS — G9340 Encephalopathy, unspecified: Secondary | ICD-10-CM | POA: Diagnosis not present

## 2022-12-08 LAB — BASIC METABOLIC PANEL
Anion gap: 6 (ref 5–15)
BUN: 26 mg/dL — ABNORMAL HIGH (ref 8–23)
CO2: 29 mmol/L (ref 22–32)
Calcium: 9.3 mg/dL (ref 8.9–10.3)
Chloride: 104 mmol/L (ref 98–111)
Creatinine, Ser: 0.95 mg/dL (ref 0.44–1.00)
GFR, Estimated: >60 mL/min (ref >60)
Glucose, Bld: 100 mg/dL — ABNORMAL HIGH (ref 70–99)
Potassium: 3.4 mmol/L — ABNORMAL LOW (ref 3.5–5.1)
Sodium: 139 mmol/L (ref 135–145)

## 2022-12-08 LAB — CBC WITH DIFFERENTIAL/PLATELET
Abs Immature Granulocytes: 0.02 10*3/uL (ref 0.00–0.07)
Basophils Absolute: 0 10*3/uL (ref 0.0–0.1)
Basophils Relative: 0 %
Eosinophils Absolute: 0.2 10*3/uL (ref 0.0–0.5)
Eosinophils Relative: 4 %
HCT: 30.8 % — ABNORMAL LOW (ref 36.0–46.0)
Hemoglobin: 10.2 g/dL — ABNORMAL LOW (ref 12.0–15.0)
Immature Granulocytes: 0 %
Lymphocytes Relative: 23 %
Lymphs Abs: 1.2 10*3/uL (ref 0.7–4.0)
MCH: 31.4 pg (ref 26.0–34.0)
MCHC: 33.1 g/dL (ref 30.0–36.0)
MCV: 94.8 fL (ref 80.0–100.0)
Monocytes Absolute: 0.5 10*3/uL (ref 0.1–1.0)
Monocytes Relative: 9 %
Neutro Abs: 3.3 10*3/uL (ref 1.7–7.7)
Neutrophils Relative %: 64 %
Platelets: 326 10*3/uL (ref 150–400)
RBC: 3.25 MIL/uL — ABNORMAL LOW (ref 3.87–5.11)
RDW: 12.6 % (ref 11.5–15.5)
WBC: 5.2 10*3/uL (ref 4.0–10.5)
nRBC: 0 % (ref 0.0–0.2)

## 2022-12-08 MED ORDER — MELATONIN 5 MG PO TABS
5.0000 mg | ORAL_TABLET | Freq: Once | ORAL | Status: AC
  Administered 2022-12-08: 5 mg via ORAL
  Filled 2022-12-08: qty 1

## 2022-12-08 NOTE — Plan of Care (Signed)

## 2022-12-08 NOTE — Plan of Care (Signed)
  Problem: Clinical Measurements: Goal: Will remain free from infection Outcome: Progressing   Problem: Nutrition: Goal: Adequate nutrition will be maintained Outcome: Progressing   Problem: Elimination: Goal: Will not experience complications related to bowel motility Outcome: Progressing

## 2022-12-08 NOTE — TOC Progression Note (Addendum)
Transition of Care [facility]) - Progression Note    Patient Details  Name: Lauren Garrett MRN: [id number] Date of Birth: 1945-08-07  Transition of Care [facility]) CM/SW Contact  Otelia Santee, LCSW Phone Number: 12/08/2022, 12:09 PM  Clinical Narrative:    Met with pt and daughter at bedside w/ pt's spouse and son via Facetime. Pt's family are agreeable to SNF plan and deny having facility preference at this point. Pt upset about these plans and states that "I can walk."  Pt's PASRR currently under review. CSW will upload requested documents for review once co-signed by MD. MD notified.  Referrals have been sent out for SNF placement and currently pending bed offers.  Pt able to transfer to SNF pending bed choice, PASRR assignment, and insurance authorization.    ADDENDUM: Requested records have been faxed in to [facility] for review.   Expected Discharge Plan: Skilled Nursing Facility Barriers to Discharge: Family Issues, SNF Pending bed offer  Expected Discharge Plan and Services In-house Referral: Clinical Social Work Discharge Planning Services: NA Post Acute Care Choice: Skilled Nursing Facility Living arrangements for the past 2 months: Single Family Home                 DME Arranged: N/A DME Agency: NA                   Social Determinants of Health (SDOH) Interventions SDOH Screenings   Food Insecurity: No Food Insecurity (12/05/2022)  Housing: Low Risk  (12/05/2022)  Transportation Needs: No Transportation Needs (12/05/2022)  Utilities: Not At Risk (12/05/2022)  Social Connections: Unknown (09/01/2021)   Received from [facility]  Tobacco Use: Low Risk  (12/04/2022)    Readmission Risk Interventions     No data to display

## 2022-12-08 NOTE — Progress Notes (Signed)
Physical Therapy Treatment Patient Details Name: Lauren Garrett MRN: [id number] DOB: December 03, 1945 Today's Date: 12/08/2022   History of Present Illness 77 y.o. female with medical history significant of HTN, HLD, osteoarthritis (B knees, L more than R), vascular dementia being followed by Dr. Pearlean Brownie.     Pt has been having ongoing progressive decline with respect to vasc dementia (increased confusion, especially at night, etc).  However, this became an acute decline starting 12/01/22 she became increasingly confused, fatigue, lethargic, had BLE weakness, and developed new inability to ambulate (normally ambulates with cane).  Dx of acute encephalopathy.    PT Comments  General Comments: showing "some" improvement from last session but remains mostly confused and requires repeat simple functional commands to stay on task.  Repeatably asks where her husband is. Difficulty express correct words.  Easily distracted. Very fearful with transfers. Assisted OOB to attempt amb was unsuccessful.  General bed mobility comments: great difficulty to follow commands.  Required increased time and easily distarcted.  Nearly 14 min to achieve seated EOB.  "wait" pt would repeat.  Used bed pad to complete transfer.  Pt present with posterior lean/push due to fear of falling. General transfer comment: pt was unable to initiate or complete a tradittional sit to stand due to impaired cognition/dementia and fear.  Pt required Total Assist + 2 "Bear Hug" in front and "hip guide" in back to complete 1/4 pivot to [facility].  Poor flexed posture and inability to self correct.  Assisted off BSC same style with pt only partially upright.  biggest barrier impaired cognition with inabilty to follow commands as well as poor self awareness. General Gait Details: attempted forward amb with Daughter in front and + 2 side by side assist however due to cognition pt was unable to functionally stepa and pushing backward due to fear of falling.  Had  to pull BSC behind pt. Used "NIKE" 1/4 pivot from [facility] to recliner then positioned to comfort.  Family now looking into [facility] Re[facility] at SNF as pt is a + 2 assist.   If plan is discharge home, recommend the following: Two people to help with walking and/or transfers;Two people to help with bathing/dressing/bathroom;Assist for transportation;Help with stairs or ramp for entrance;Assistance with cooking/housework   Can travel by private vehicle     No  Equipment Recommendations  None recommended by PT    Recommendations for Other Services       Precautions / Restrictions Precautions Precautions: Fall Precaution Comments: Hx Dementia Restrictions Weight Bearing Restrictions: No     Mobility  Bed Mobility Overal bed mobility: Needs Assistance Bed Mobility: Supine to Sit     Supine to sit: Max assist     General bed mobility comments: great difficulty to follow commands.  Required increased time and easily distarcted.  Nearly 14 min to achieve seated EOB.  "wait" pt would repeat.  Used bed pad to complete transfer.  Pt present with posterior lean/push due to fear of falling.    Transfers Overall transfer level: Needs assistance Equipment used: 2 person hand held assist Transfers: Bed to chair/wheelchair/BSC   Stand pivot transfers: +2 physical assistance, +2 safety/equipment, From elevated surface, Total assist         General transfer comment: pt was unable to initiate or complete a tradittional sit to stand due to impaired cognition/dementia and fear.  Pt required Total Assist + 2 "Bear Hug" in front and "hip guide" in back to complete 1/4 pivot to [facility].  Poor  flexed posture and inability to self correct.  Assisted off BSC same style with pt only partially upright.  biggest barrier impaired cognition with inabilty to follow commands as well as poor self awareness.    Ambulation/Gait Ambulation/Gait assistance: Total assist, Max assist, +2 physical assistance Gait Distance  (Feet): 1 Feet Assistive device: Bilateral platform walker Gait Pattern/deviations: Step-to pattern       General Gait Details: attempted forward amb with Daughter in front and + 2 side by side assist however due to cognition pt was unable to functionally stepa and pushing backward due to fear of falling.  Had to pull BSC behind pt.   Stairs             Wheelchair Mobility     Tilt Bed    Modified Rankin (Stroke Patients Only)       Balance                                            Cognition Arousal: Alert Behavior During Therapy: Restless Overall Cognitive Status: History of cognitive impairments - at baseline Area of Impairment: Orientation, Memory, Safety/judgement                 Orientation Level: Place, Time, Situation, Disoriented to             General Comments: showing "some" improvement from last session but remains mostly confused and requires repeat simple functional commands to stay on task.  Repeatably asks where her husband is. Difficulty express correct words.  Easily distracted. Very fearful with transfers.        Exercises      General Comments        Pertinent Vitals/Pain Pain Assessment Pain Assessment: No/denies pain    Home Living                          Prior Function            PT Goals (current goals can now be found in the care plan section) Progress towards PT goals: Progressing toward goals    Frequency    Min 1X/week      PT Plan      Co-evaluation              AM-PAC PT "6 Clicks" Mobility   Outcome Measure  Help needed turning from your back to your side while in a flat bed without using bedrails?: Total Help needed moving from lying on your back to sitting on the side of a flat bed without using bedrails?: Total Help needed moving to and from a bed to a chair (including a wheelchair)?: Total Help needed standing up from a chair using your arms (e.g.,  wheelchair or bedside chair)?: Total Help needed to walk in hospital room?: Total Help needed climbing 3-5 steps with a railing? : Total 6 Click Score: 6    End of Session Equipment Utilized During Treatment: Gait belt Activity Tolerance: Patient tolerated treatment well Patient left: in chair;with call bell/phone within reach;with family/visitor present Nurse Communication: Mobility status PT Visit Diagnosis: Other abnormalities of gait and mobility (R26.89);Pain     Time: 1000-1030 PT Time Calculation (min) (ACUTE ONLY): 30 min  Charges:    $Therapeutic Activity: 23-37 mins PT General Charges $$ ACUTE PT VISIT: 1 Visit  Felecia Shelling  PTA Acute  Rehabilitation Services Office M-F          [phone]

## 2022-12-08 NOTE — Progress Notes (Signed)
HOSPITALIST ROUNDING NOTE MINDEE Garrett ZOX:[id number]  DOB: 1945/05/23  DOA: 12/04/2022  PCP: Gaspar Garbe, MD  12/08/2022,3:57 PM   LOS: 2 days      Code Status: Full   From: Home  current Dispo: Probable skilled facility in several days     76 home dwelling with husband  moderate vascular dementia MMSE 19/30 in 03/2022 followed by Dr Pearlean Brownie, lumbosacral degeneration and chronic bilateral knee issues osteoarthritis of knees previously conservatively managed at [facility] orthopedics by Dr. [place] Has been seen in the past by Dr. Franky Macho to discuss repeat back surgery Previous low TSH of 0.44 with adjustment of Synthroid ANA positive no suggestion of lupus   Has primarily small fiber neuropathy and Aricept was discussed at last neurology office patient was supposed to take it but was noncompliant Patient was encouraged to perform cognitively challenging activities by Dr Pearlean Brownie at his last office visit 04/02/2022   Patient has had progressive decline and this became acute to subacute on 8/12 patient was found on the floor sounds like she fell patient was cleaned up perked up but still tired and patient came to the ED   Knee x-ray left side showed large left effusion, CT left knee showed tricompartmental degenerative changes in the lateral compartment with moderate joint effusion and loose intra-articular bodies with 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 Christell Constant of [facility] orthopedics consulted regarding effusion?  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 Franky Macho D/w IR epic chat on 8/16  did not feel that the patient had any specific abscess although the report said "gas and fluid"--do not think patient has a back infection CRP/ESR non specific--no overt fever Outpatient follow-up Dr. Franky Macho supportive management with p.o. diclofenac 75 twice daily as needed moderate pain--topical Voltaren gel for pain-CC Dr. Franky Macho at follow-up  Severe hypokalemia on admit Replaced and resolved check labs in the outpatient setting  HTN Stop avapro 8/18  AKI Likely 2/2 ARB/Poor PO intake Saline IV 8/19-periodic labs in the outpatient setting encourage at least 1.5 L of fluid daily-jug to bedside  Vascular dementia followed by Dr Pearlean Brownie Needs repeat MMSE-last 1 done was 19/30-was not taking Aricept-have started it here and she has suffered no side effects  Hypothyroidism Continue Synthroid 88 Requires TSH in about 4 weeks   DVT prophylaxis: Lovenox  Status is: Observation The patient will require care spanning > 2 midnights and should be moved to inpatient because:  ?Requires skilled    Subjective:  Pleasantly confused but much better than previous Seems to be unwilling to go to rehab-I discussed this separately with her husband outside the room and strongly encouraged the patient to go to short-term rehab-I reiterated that I would only be short-term No fever no chills nausea vomiting ate a good breakfast ate only a little bit of large  Objective + exam Vitals:   12/07/22 2021 12/08/22 0533  12/08/22 0800 12/08/22 1254  BP: 111/77 (!) 147/70  138/65  Pulse: 73 79  83  Resp: 17 18 19 17   Temp: 98.2 F (36.8 C) 98.8 F (37.1 C)  97.9 F (36.6 C)  TempSrc: Oral     SpO2: 100% 100%  100%  Weight:       Height:       Filed Weights   12/04/22 1752 12/05/22 0520  Weight: 62 kg 59.7 kg    Examination:  Less sleep, sitting in chair Eomi ncat no focal deficit Abd soft nt nd no rebound no guard Rom intact  Confused but better than when admitted  Data Reviewed: reviewed   CBC    Component Value Date/Time   WBC 5.2 12/08/2022 0501   RBC 3.25 (L) 12/08/2022 0501   HGB 10.2 (L) 12/08/2022 0501   HCT 30.8 (L) 12/08/2022 0501   PLT 326 12/08/2022 0501   MCV 94.8 12/08/2022 0501   MCH 31.4 12/08/2022 0501   MCHC 33.1 12/08/2022 0501   RDW 12.6 12/08/2022 0501   LYMPHSABS 1.2 12/08/2022 0501   MONOABS 0.5 12/08/2022 0501   EOSABS 0.2 12/08/2022 0501   BASOSABS 0.0 12/08/2022 0501      Latest Ref Rng & Units 12/08/2022    5:01 AM 12/07/2022    5:46 AM 12/06/2022    6:35 AM  CMP  Glucose 70 - 99 mg/dL 962  83  90   BUN 8 - 23 mg/dL 26  32  19   Creatinine 0.44 - 1.00 mg/dL 9.52  8.41  3.24   Sodium 135 - 145 mmol/L 139  137  139   Potassium 3.5 - 5.1 mmol/L 3.4  3.5  3.5   Chloride 98 - 111 mmol/L 104  101  102   CO2 22 - 32 mmol/L 29  27  28    Calcium 8.9 - 10.3 mg/dL 9.3  9.8  40.1     Scheduled Meds:  atorvastatin  20 mg Oral Daily   cholecalciferol  1,000 Units Oral Daily   donepezil  5 mg Oral QHS   enoxaparin (LOVENOX) injection  40 mg Subcutaneous Q24H   iron polysaccharides  150 mg Oral BID   levothyroxine  88 mcg Oral Q0600   multivitamin with minerals  1 tablet Oral Daily   Continuous Infusions:  Time 25  Rhetta Mura, MD  [facility]

## 2022-12-08 NOTE — TOC PASRR Note (Signed)
TOC Dementia Note   Patient Details  Name: Lauren Garrett Date of Birth: Jan 16, 1946 12/08/2022, 11:54 AM   To Whom It May Concern:  Please be advised that the above-named patient has a primary diagnosis of dementia which supersedes any psychiatric diagnosis.   Transition of Care [facility]) CM/SW Contact: Otelia Santee, LCSW Phone Number: [phone] 12/08/2022, 11:54 AM

## 2022-12-08 NOTE — NC FL2 (Signed)
MEDICAID FL2 LEVEL OF CARE FORM     IDENTIFICATION  Patient Name: Lauren Garrett Birthdate: Aug 28, 1945 Sex: female Admission Date (Current Location): 12/04/2022  [facility] and [place] Number:  Producer, television/film/video and Address:  [facility],  [address] [place], [place] [address]      Provider Number: [id number]  Attending Physician Name and Address:  Rhetta Mura, MD  Relative Name and Phone Number:  [place], [place] [phone]    Current Level of Care: Hospital Recommended Level of Care: Skilled Nursing Facility Prior Approval Number:    Date Approved/Denied:   PASRR Number: Pending  Discharge Plan: SNF    Current Diagnoses: Patient Active Problem List   Diagnosis Date Noted   Acute encephalopathy 12/05/2022   Vascular dementia (HCC) 12/05/2022   HTN (hypertension) 12/05/2022   Acute pain of left knee 12/05/2022   Bilateral primary osteoarthritis of knee 05/31/2020   Pain in left ankle and joints of left foot 01/17/2020   Elevated blood pressure reading in office without diagnosis of hypertension 06/09/2017   Lipoma 12/28/2015   Dizziness and giddiness 09/27/2015   Tension headache 03/23/2013   Disturbance of skin sensation 02/02/2013   H/O cervical spine surgery 02/02/2013   Degeneration of lumbar or lumbosacral intervertebral disc 02/02/2013   Generalized anxiety disorder 02/02/2013   Hypercholesteremia     Orientation RESPIRATION BLADDER Height & Weight     Self  Normal Incontinent Weight: 131 lb 9.8 oz (59.7 kg) Height:  5\' 7"  (170.2 cm)  BEHAVIORAL SYMPTOMS/MOOD NEUROLOGICAL BOWEL NUTRITION STATUS      Incontinent Diet (Carb modified)  AMBULATORY STATUS COMMUNICATION OF NEEDS Skin   Total Care Verbally Normal                       Personal Care Assistance Level of Assistance  Bathing, Feeding, Dressing Bathing Assistance: Maximum assistance Feeding assistance: Limited assistance Dressing Assistance:  Maximum assistance     Functional Limitations Info  Sight, Hearing, Speech Sight Info: Impaired Hearing Info: Adequate Speech Info: Adequate    SPECIAL CARE FACTORS FREQUENCY  PT (By licensed PT), OT (By licensed OT)     PT Frequency: 5x/wk OT Frequency: 5x/wk            Contractures Contractures Info: Not present    Additional Factors Info  Code Status, Allergies Code Status Info: FULL Allergies Info: Other, Topamax (Topiramate), Macrobid (Nitrofurantoin Monohydrate Macrocrystals)           Current Medications (12/08/2022):  This is the current hospital active medication list Current Facility-Administered Medications  Medication Dose Route Frequency Provider Last Rate Last Admin   acetaminophen (TYLENOL) tablet 650 mg  650 mg Oral Q6H PRN Hillary Bow, DO   650 mg at 12/07/22 1326   Or   acetaminophen (TYLENOL) suppository 650 mg  650 mg Rectal Q6H PRN Hillary Bow, DO       atorvastatin (LIPITOR) tablet 20 mg  20 mg Oral Daily Lyda Perone M, DO   20 mg at 12/08/22 1136   cholecalciferol (VITAMIN D3) 25 MCG (1000 UNIT) tablet 1,000 Units  1,000 Units Oral Daily Hillary Bow, DO   1,000 Units at 12/08/22 1136   diclofenac (VOLTAREN) EC tablet 75 mg  75 mg Oral BID PRN Hillary Bow, DO   75 mg at 12/07/22 1327   diclofenac Sodium (VOLTAREN) 1 % topical gel 2 g  2 g Topical QID PRN Hillary Bow,  DO   2 g at 12/07/22 2057   donepezil (ARICEPT) tablet 5 mg  5 mg Oral QHS Rhetta Mura, MD   5 mg at 12/07/22 2054   enoxaparin (LOVENOX) injection 40 mg  40 mg Subcutaneous Q24H Lyda Perone M, DO   40 mg at 12/08/22 1136   iron polysaccharides (NIFEREX) capsule 150 mg  150 mg Oral BID Hillary Bow, DO   150 mg at 12/08/22 1136   levothyroxine (SYNTHROID) tablet 88 mcg  88 mcg Oral Q0600 Hillary Bow, DO   88 mcg at 12/08/22 0534   multivitamin with minerals tablet 1 tablet  1 tablet Oral Daily Hillary Bow, DO   1 tablet at 12/08/22  1136   ondansetron (ZOFRAN) tablet 4 mg  4 mg Oral Q6H PRN Hillary Bow, DO       Or   ondansetron [facility]) injection 4 mg  4 mg Intravenous Q6H PRN Hillary Bow, DO         Discharge Medications: Please see discharge summary for a list of discharge medications.  Relevant Imaging Results:  Relevant Lab Results:   Additional Information SSN: [id number]  Otelia Santee, LCSW

## 2022-12-08 NOTE — Plan of Care (Signed)
Orthopedic Plan of Care Note  Aspirate results checked today. No organisms growing on culture. No crystals seen in the body fluid cell count lab and urate levels wnl for synovial fluid at her age. Total cell count was 425. Still no operative plans at this time. If culture were to turn positive, that may change management but all her other labs and her last exam by me point against septic arthritis. Still recommend symptomatic treatment and patient can weight bear as tolerated. Weight bearing as tolerated.    Lauren Sheer, MD Orthopedic Surgeon

## 2022-12-09 ENCOUNTER — Ambulatory Visit: Payer: Self-pay

## 2022-12-09 DIAGNOSIS — G9340 Encephalopathy, unspecified: Secondary | ICD-10-CM | POA: Diagnosis not present

## 2022-12-09 LAB — BASIC METABOLIC PANEL
Anion gap: 9 (ref 5–15)
BUN: 16 mg/dL (ref 8–23)
CO2: 27 mmol/L (ref 22–32)
Calcium: 9.5 mg/dL (ref 8.9–10.3)
Chloride: 103 mmol/L (ref 98–111)
Creatinine, Ser: 0.77 mg/dL (ref 0.44–1.00)
GFR, Estimated: >60 mL/min (ref >60)
Glucose, Bld: 98 mg/dL (ref 70–99)
Potassium: 3 mmol/L — ABNORMAL LOW (ref 3.5–5.1)
Sodium: 139 mmol/L (ref 135–145)

## 2022-12-09 LAB — CBC WITH DIFFERENTIAL/PLATELET
Abs Immature Granulocytes: 0.01 10*3/uL (ref 0.00–0.07)
Basophils Absolute: 0.1 10*3/uL (ref 0.0–0.1)
Basophils Relative: 1 %
Eosinophils Absolute: 0.2 10*3/uL (ref 0.0–0.5)
Eosinophils Relative: 5 %
HCT: 37.8 % (ref 36.0–46.0)
Hemoglobin: 12 g/dL (ref 12.0–15.0)
Immature Granulocytes: 0 %
Lymphocytes Relative: 26 %
Lymphs Abs: 1 10*3/uL (ref 0.7–4.0)
MCH: 30.6 pg (ref 26.0–34.0)
MCHC: 31.7 g/dL (ref 30.0–36.0)
MCV: 96.4 fL (ref 80.0–100.0)
Monocytes Absolute: 0.3 10*3/uL (ref 0.1–1.0)
Monocytes Relative: 8 %
Neutro Abs: 2.4 10*3/uL (ref 1.7–7.7)
Neutrophils Relative %: 60 %
Platelets: 406 10*3/uL — ABNORMAL HIGH (ref 150–400)
RBC: 3.92 MIL/uL (ref 3.87–5.11)
RDW: 12.7 % (ref 11.5–15.5)
WBC: 4 10*3/uL (ref 4.0–10.5)
nRBC: 0 % (ref 0.0–0.2)

## 2022-12-09 LAB — BODY FLUID CULTURE W GRAM STAIN: Culture: NO GROWTH

## 2022-12-09 MED ORDER — POTASSIUM CHLORIDE CRYS ER 20 MEQ PO TBCR
40.0000 meq | EXTENDED_RELEASE_TABLET | Freq: Every day | ORAL | Status: DC
  Administered 2022-12-09 – 2022-12-10 (×2): 40 meq via ORAL
  Filled 2022-12-09 (×3): qty 2

## 2022-12-09 MED ORDER — ACETAMINOPHEN 325 MG PO TABS: 650.0000 mg | ORAL_TABLET | Freq: Four times a day (QID) | ORAL | Status: AC | PRN

## 2022-12-09 MED ORDER — MELATONIN 5 MG PO TABS
5.0000 mg | ORAL_TABLET | Freq: Once | ORAL | Status: AC
  Administered 2022-12-09: 5 mg via ORAL
  Filled 2022-12-09: qty 1

## 2022-12-09 MED ORDER — POTASSIUM CHLORIDE CRYS ER 20 MEQ PO TBCR: 40.0000 meq | EXTENDED_RELEASE_TABLET | Freq: Every day | ORAL | Status: AC

## 2022-12-09 MED ORDER — DONEPEZIL HCL 5 MG PO TABS: 5.0000 mg | ORAL_TABLET | Freq: Every day | ORAL | 0 refills | Status: DC

## 2022-12-09 MED ORDER — ONDANSETRON HCL 4 MG PO TABS: 4.0000 mg | ORAL_TABLET | Freq: Four times a day (QID) | ORAL | Status: AC | PRN

## 2022-12-09 NOTE — Discharge Summary (Signed)
Physician Discharge Summary  AAYUSHI COAKER ZOX:[id number] DOB: [birth year] DOA: 12/04/2022  PCP: Gaspar Garbe, MD  Admit date: 12/04/2022 Discharge date: 12/09/2022  Time spent: 40 minutes  Recommendations for Outpatient Follow-up:  Needs close follow-up of dementia with neurologist Dr Pearlean Brownie--- I will CC him for close follow-up Watch patient while eating as might have some underlying risk of aspiration Check Chem-7 CBC plus differential in about 1 week given mild hypokalemia and AKI during hospital stay-please ensure at least 1.5 L of water at all times while at rehab Would not aggressively control blood pressure in the outpatient setting Please refer back to Dr. Franky Macho for management of lumbago and other back issues--- I suspect patient may have an underlying spondyloarthropathy and if continues to have pain fever or swelling of joints would suggest rheumatoid factor ANA etc. 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 Hypokalemia AKI Vascular dementia Hypothyroidism  Please see below for itemized issues addressed in HOpsital- refer to other progress notes for clarity if needed  Discharge Condition: Improved  Diet recommendation: Heart healthy  Filed Weights   12/04/22 1752 12/05/22 0520  Weight: 62 kg 59.7 kg    History of present illness:  76 home dwelling with husband  moderate vascular dementia MMSE 19/30 in 03/2022 followed by Dr Pearlean Brownie, lumbosacral degeneration and chronic bilateral knee issues osteoarthritis of knees previously conservatively managed at [facility] orthopedics by Dr. [place] Has been seen in the past by Dr. Franky Macho to discuss repeat back surgery Previous low TSH of 0.44 with adjustment of Synthroid ANA positive no suggestion of lupus   Has primarily small fiber neuropathy and Aricept was discussed at last  neurology office patient was supposed to take it but was noncompliant Patient was encouraged to perform cognitively challenging activities by Dr Pearlean Brownie at his last office visit 04/02/2022   Patient has had progressive decline and this became acute to subacute on 8/12 patient was found on the floor sounds like she fell patient was cleaned up perked up but still tired and patient came to the ED   Knee x-ray left side showed large left effusion, CT left knee showed tricompartmental degenerative changes in the lateral compartment with moderate joint effusion and loose intra-articular bodies with 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 Christell Constant of [facility] orthoped[facility] consulted regarding effusion?  Septic arthritis-- appreciative of his care and collegiality and he performed a knee tap See below  Hospital Course:  Inflammatory arthritis likely osteoarthritis 76% inflammatory cells on knee tap, culture, crystal analysis all negative so this is probably only OA-do not suspect any other etiology such as lupus although ANA has been positive in the past If has underlying fevers ongoing then may be will need a rheumatoid factor and further workup as per PCP but at this time I think we can hold on the same-myositis is nonspecific but can be seen with rheumatoid as well so this may warrant a rheumatoid factor in the outpatient setting and  workup Appreciate orthopedic input Dr. Christell Constant who performed a knee tap can follow-up with them in the outpatient setting   Degenerative changes-a question of myositis in the lower  back on MRI Lumbosacral degeneration followed by Dr. Franky Macho D/w IR Dr. Rennis Chris on epic chat on 8/16  did not feel that the patient had any specific abscess although the report said "gas and fluid"--do not think patient has a back infection CRP/ESR non specific--no overt fever since admission Outpatient follow-up Dr. Franky Macho supportive management with p.o. diclofenac 75 twice daily as needed moderate pain--topical Voltaren gel for pain-CC Dr. Franky Macho at follow-up   Severe hypokalemia on admit Replaced and resolved check labs in the outpatient setting   HTN Stop avapro 8/18-, stop triamterene hydrochlorothiazide Felt that some of the AKI was secondary to diuretics as well as ARB If really needs control would add amlodipine low-dose in the outpatient setting   AKI Likely 2/2 ARB/Poor PO intake Saline IV 8/19-periodic labs in the outpatient setting encourage at least 1.5 L of fluid daily-jug to bedside   Vascular dementia followed by Dr Pearlean Brownie Needs repeat MMSE-last 1 done was 19/30-was not taking Aricept-have started it here and she has suffered no side effects   Hypothyroidism Continue Synthroid 88 Requires TSH in about 4 weeks   Discharge Exam: Vitals:   12/09/22 0323 12/09/22 0657  BP: (!) 187/78 (!) 165/76  Pulse: 93 81  Resp: 16   Temp: 98.6 F (37 C)   SpO2: 99%     Subj on day of d/c   Pleasantly confused no distress Looks comfortable Chest is clear S1-S2 no murmur Abdomen is soft Knee does not seem swollen or red Discharge Instructions   Discharge Instructions     Diet - low sodium heart healthy   Complete by: As directed    Increase activity slowly   Complete by: As directed       Allergies as of 12/09/2022       Reactions   Other    Pt feels she is allergic to another med but unsure of name   Topamax [topiramate] Itching   Macrobid [nitrofurantoin Monohydrate Macrocrystals] Itching, Rash        Medication List     STOP taking these  medications    aspirin EC 81 MG tablet   irbesartan 300 MG tablet Commonly known as: AVAPRO   tiZANidine 4 MG tablet Commonly known as: ZANAFLEX   triamterene-hydrochlorothiazide 37.5-25 MG tablet Commonly known as: MAXZIDE-25       TAKE these medications    acetaminophen 325 MG tablet Commonly known as: TYLENOL Take 2 tablets (650 mg total) by mouth every 6 (six) hours as needed for mild pain (or Fever >/= 101).   atorvastatin 20 MG tablet Commonly known as: LIPITOR Take 20 mg by mouth daily.   cholecalciferol 25 MCG (1000 UNIT) tablet Commonly known as: VITAMIN D3 Take 1,000 Units by mouth daily.   diclofenac 75 MG EC tablet Commonly known as: VOLTAREN Take 75 mg by mouth 2 (two) times daily as needed.   diclofenac sodium 1 % Gel Commonly known as: VOLTAREN Apply 2 g topically 4 (four) times daily as needed (pain).   donepezil 5 MG tablet Commonly known as: ARICEPT Take 1 tablet (5 mg total) by mouth at bedtime.   iron polysaccharides 150 MG capsule Commonly known as: NIFEREX Take 150 mg by mouth 2 (two) times daily. Nu iron   multivitamin with minerals Tabs tablet Take 1 tablet by mouth  daily.   ondansetron 4 MG tablet Commonly known as: ZOFRAN Take 1 tablet (4 mg total) by mouth every 6 (six) hours as needed for nausea.   potassium chloride SA 20 MEQ tablet Commonly known as: KLOR-CON M Take 2 tablets (40 mEq total) by mouth daily. Start taking on: December 10, 2022   Synthroid 88 MCG tablet Generic drug: levothyroxine Take 88 mcg by mouth daily.       Allergies  Allergen Reactions   Other     Pt feels she is allergic to another med but unsure of name   Topamax [Topiramate] Itching   Macrobid [Nitrofurantoin Monohydrate Macrocrystals] Itching and Rash      The results of significant diagnostics from this hospitalization (including imaging, microbiology, ancillary and laboratory) are listed below for reference.    Significant Diagnostic  Studies: MR CERVICAL SPINE WO CONTRAST  Result Date: 12/05/2022 CLINICAL DATA:  Acute myelopathy EXAM: MRI CERVICAL, THORACIC AND LUMBAR SPINE WITHOUT CONTRAST TECHNIQUE: Multiplanar and multiecho pulse sequences of the cervical spine, to include the craniocervical junction and cervicothoracic junction, and thoracic and lumbar spine, were obtained without intravenous contrast. COMPARISON:  Lumbar MRI 06/26/2022 FINDINGS: MRI CERVICAL SPINE FINDINGS Alignment: Straightening of cervical lordosis with mild C3-4 anterolisthesis that is degenerative Vertebrae: ACDF with solid arthrodesis at C6-T1 scratch the ACDF from C 4 to T1. Solid arthrodesis at C5-6 and C7-T1 at least. Cord: Small area of discrete T2 hyperintensity in the right cord at the level of C4-5. Posterior Fossa, vertebral arteries, paraspinal tissues: The intrinsic neck muscles show diffuse STIR hyperintensity. No collection. Disc levels: C2-3: Degenerative facet spurring on the left more than right with small central protrusion and eccentric left uncovertebral ridging. Moderate left foraminal impingement. C3-4: Degenerative facet spurring which is bulky. There is a central protrusion which is upward pointing. Uncovertebral spurring on both sides. Right foraminal impingement to a moderate degree. C4-5: ACDF.  No impingement C5-6: ACDF and solid arthrodesis.  No impingement C6-7: ACDF. Biforaminal impingement from uncovertebral spurring and height loss. C7-T1:ACDF with solid arthrodesis. Right foraminal impingement from disc height loss and hypertrophic bone. MRI THORACIC SPINE FINDINGS Alignment:  No traumatic malalignment.  Mild dextrocurvature Vertebrae: No fracture, evidence of discitis, or bone lesion. Cord:  Normal signal and morphology. Paraspinal and other soft tissues: Diffuse T2 hyperintensity within intrinsic back muscles. Involvement is symmetric. No collection is seen. Disc levels: Generalized disc desiccation in keeping with age. More  notable degeneration at T11-12 where there is disc height loss and bulging. Facet spurring at this level causes biforaminal stenosis with moderate impingement greater on the left. Also notable is T1-2 and T2-3 right facet spurring causing foraminal impingement. Diffusely patent spinal canal. MRI LUMBAR SPINE FINDINGS Segmentation:  Standard. Alignment:  Degenerative scoliosis Vertebrae:  No fracture, evidence of discitis, or bone lesion. Conus medullaris and cauda equina: Conus extends to the L1 level. Conus and cauda equina appear normal. Paraspinal and other soft tissues: Muscular edema symmetrically in the intrinsic back muscles. No evidence of collection. Disc levels: T12- L1: Unremarkable. L1-L2: Unremarkable. L2-L3: Mild disc bulging and facet spurring L3-L4: Disc collapse with gas containing and fluid containing fissure in the disc space. Eccentric right endplate and facet spurring with advanced right foraminal impingement. Advanced spinal stenosis L4-L5: Gas and fluid containing disc fissure with height loss and bulging eccentric to the right where there is also greater endplate and facet spurring. Severe right foraminal impingement. Severe spinal stenosis. Left foraminal impingement is moderate L5-S1:Disc collapse with endplate  ridging and facet spurring eccentric to the left where there is severe foraminal impingement. Moderate left subarticular recess stenosis. IMPRESSION: 1. Generalized intrinsic back muscle edema. Given the diffuse nature and symmetry myositis is favored over strain. 2. 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 Severe left foraminal impingement at L5-S1. 3. Extensive cervical fusion. Levels of residual foraminal impingement described above. Mild myelomalacia in the cord on the right at C4-5, a level of prior fusion. 4. Thoracic foraminal impingement on the right at T1-2, T2-3 and to a lesser extent bilaterally at T11-12.  Electronically Signed   By: Tiburcio Pea M.D.   On: 12/05/2022 12:35   MR THORACIC SPINE WO CONTRAST  Result Date: 12/05/2022 CLINICAL DATA:  Acute myelopathy EXAM: MRI CERVICAL, THORACIC AND LUMBAR SPINE WITHOUT CONTRAST TECHNIQUE: Multiplanar and multiecho pulse sequences of the cervical spine, to include the craniocervical junction and cervicothoracic junction, and thoracic and lumbar spine, were obtained without intravenous contrast. COMPARISON:  Lumbar MRI 06/26/2022 FINDINGS: MRI CERVICAL SPINE FINDINGS Alignment: Straightening of cervical lordosis with mild C3-4 anterolisthesis that is degenerative Vertebrae: ACDF with solid arthrodesis at C6-T1 scratch the ACDF from C 4 to T1. Solid arthrodesis at C5-6 and C7-T1 at least. Cord: Small area of discrete T2 hyperintensity in the right cord at the level of C4-5. Posterior Fossa, vertebral arteries, paraspinal tissues: The intrinsic neck muscles show diffuse STIR hyperintensity. No collection. Disc levels: C2-3: Degenerative facet spurring on the left more than right with small central protrusion and eccentric left uncovertebral ridging. Moderate left foraminal impingement. C3-4: Degenerative facet spurring which is bulky. There is a central protrusion which is upward pointing. Uncovertebral spurring on both sides. Right foraminal impingement to a moderate degree. C4-5: ACDF.  No impingement C5-6: ACDF and solid arthrodesis.  No impingement C6-7: ACDF. Biforaminal impingement from uncovertebral spurring and height loss. C7-T1:ACDF with solid arthrodesis. Right foraminal impingement from disc height loss and hypertrophic bone. MRI THORACIC SPINE FINDINGS Alignment:  No traumatic malalignment.  Mild dextrocurvature Vertebrae: No fracture, evidence of discitis, or bone lesion. Cord:  Normal signal and morphology. Paraspinal and other soft tissues: Diffuse T2 hyperintensity within intrinsic back muscles. Involvement is symmetric. No collection is seen. Disc  levels: Generalized disc desiccation in keeping with age. More notable degeneration at T11-12 where there is disc height loss and bulging. Facet spurring at this level causes biforaminal stenosis with moderate impingement greater on the left. Also notable is T1-2 and T2-3 right facet spurring causing foraminal impingement. Diffusely patent spinal canal. MRI LUMBAR SPINE FINDINGS Segmentation:  Standard. Alignment:  Degenerative scoliosis Vertebrae:  No fracture, evidence of discitis, or bone lesion. Conus medullaris and cauda equina: Conus extends to the L1 level. Conus and cauda equina appear normal. Paraspinal and other soft tissues: Muscular edema symmetrically in the intrinsic back muscles. No evidence of collection. Disc levels: T12- L1: Unremarkable. L1-L2: Unremarkable. L2-L3: Mild disc bulging and facet spurring L3-L4: Disc collapse with gas containing and fluid containing fissure in the disc space. Eccentric right endplate and facet spurring with advanced right foraminal impingement. Advanced spinal stenosis L4-L5: Gas and fluid containing disc fissure with height loss and bulging eccentric to the right where there is also greater endplate and facet spurring. Severe right foraminal impingement. Severe spinal stenosis. Left foraminal impingement is moderate L5-S1:Disc collapse with endplate ridging and facet spurring eccentric to the left where there is severe foraminal impingement. Moderate left subarticular recess stenosis. IMPRESSION: 1. Generalized intrinsic back  muscle edema. Given the diffuse nature and symmetry myositis is favored over strain. 2. 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 Severe left foraminal impingement at L5-S1. 3. Extensive cervical fusion. Levels of residual foraminal impingement described above. Mild myelomalacia in the cord on the right at C4-5, a level of prior fusion. 4. Thoracic foraminal impingement on the right at  T1-2, T2-3 and to a lesser extent bilaterally at T11-12. Electronically Signed   By: Tiburcio Pea M.D.   On: 12/05/2022 12:35   MR LUMBAR SPINE WO CONTRAST  Result Date: 12/05/2022 CLINICAL DATA:  Acute myelopathy EXAM: MRI CERVICAL, THORACIC AND LUMBAR SPINE WITHOUT CONTRAST TECHNIQUE: Multiplanar and multiecho pulse sequences of the cervical spine, to include the craniocervical junction and cervicothoracic junction, and thoracic and lumbar spine, were obtained without intravenous contrast. COMPARISON:  Lumbar MRI 06/26/2022 FINDINGS: MRI CERVICAL SPINE FINDINGS Alignment: Straightening of cervical lordosis with mild C3-4 anterolisthesis that is degenerative Vertebrae: ACDF with solid arthrodesis at C6-T1 scratch the ACDF from C 4 to T1. Solid arthrodesis at C5-6 and C7-T1 at least. Cord: Small area of discrete T2 hyperintensity in the right cord at the level of C4-5. Posterior Fossa, vertebral arteries, paraspinal tissues: The intrinsic neck muscles show diffuse STIR hyperintensity. No collection. Disc levels: C2-3: Degenerative facet spurring on the left more than right with small central protrusion and eccentric left uncovertebral ridging. Moderate left foraminal impingement. C3-4: Degenerative facet spurring which is bulky. There is a central protrusion which is upward pointing. Uncovertebral spurring on both sides. Right foraminal impingement to a moderate degree. C4-5: ACDF.  No impingement C5-6: ACDF and solid arthrodesis.  No impingement C6-7: ACDF. Biforaminal impingement from uncovertebral spurring and height loss. C7-T1:ACDF with solid arthrodesis. Right foraminal impingement from disc height loss and hypertrophic bone. MRI THORACIC SPINE FINDINGS Alignment:  No traumatic malalignment.  Mild dextrocurvature Vertebrae: No fracture, evidence of discitis, or bone lesion. Cord:  Normal signal and morphology. Paraspinal and other soft tissues: Diffuse T2 hyperintensity within intrinsic back muscles.  Involvement is symmetric. No collection is seen. Disc levels: Generalized disc desiccation in keeping with age. More notable degeneration at T11-12 where there is disc height loss and bulging. Facet spurring at this level causes biforaminal stenosis with moderate impingement greater on the left. Also notable is T1-2 and T2-3 right facet spurring causing foraminal impingement. Diffusely patent spinal canal. MRI LUMBAR SPINE FINDINGS Segmentation:  Standard. Alignment:  Degenerative scoliosis Vertebrae:  No fracture, evidence of discitis, or bone lesion. Conus medullaris and cauda equina: Conus extends to the L1 level. Conus and cauda equina appear normal. Paraspinal and other soft tissues: Muscular edema symmetrically in the intrinsic back muscles. No evidence of collection. Disc levels: T12- L1: Unremarkable. L1-L2: Unremarkable. L2-L3: Mild disc bulging and facet spurring L3-L4: Disc collapse with gas containing and fluid containing fissure in the disc space. Eccentric right endplate and facet spurring with advanced right foraminal impingement. Advanced spinal stenosis L4-L5: Gas and fluid containing disc fissure with height loss and bulging eccentric to the right where there is also greater endplate and facet spurring. Severe right foraminal impingement. Severe spinal stenosis. Left foraminal impingement is moderate L5-S1:Disc collapse with endplate ridging and facet spurring eccentric to the left where there is severe foraminal impingement. Moderate left subarticular recess stenosis. IMPRESSION: 1. Generalized intrinsic back muscle edema. Given the diffuse nature and symmetry myositis is favored over strain. 2. Advanced lower lumbar spine degeneration with severe spinal and foraminal  stenosis at L3-4 and L4-5. Severe left foraminal impingement at L5-S1. 3. Extensive cervical fusion. Levels of residual foraminal impingement described above. Mild myelomalacia in the cord on the right at C4-5, a level of prior  fusion. 4. Thoracic foraminal impingement on the right at T1-2, T2-3 and to a lesser extent bilaterally at T11-12. Electronically Signed   By: Tiburcio Pea M.D.   On: 12/05/2022 12:35   CT KNEE LEFT WO CONTRAST  Result Date: 12/05/2022 CLINICAL DATA:  Joint effusion after a fall. EXAM: CT OF THE LEFT KNEE WITHOUT CONTRAST TECHNIQUE: Multidetector CT imaging of the left knee was performed according to the standard protocol. Multiplanar CT image reconstructions were also generated. RADIATION DOSE REDUCTION: This exam was performed according to the departmental dose-optimization program which includes automated exposure control, adjustment of the mA and/or kV according to patient size and/or use of iterative reconstruction technique. COMPARISON:  X-rays from earlier same day FINDINGS: Bones/Joint/Cartilage No evidence for an acute fracture no dislocation. Anterior subluxation of the lateral femoral condyle is likely degenerative. There is loss of joint space in the lateral compartment with lateral compartment subchondral sclerosis and cyst formation and extensive osteophytic change. Prominent degenerative spurring is seen in the medial and patellofemoral compartments as well. Intra-articular loose bodies are seen both anteriorly and posteriorly. Moderate joint effusion evident. 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 Ligaments Suboptimally assessed by CT. Muscles and Tendons Unremarkable. Soft tissues Unremarkable. IMPRESSION: 1. No evidence for an acute fracture or dislocation. 2. Tricompartmental degenerative changes, most severe in the lateral compartment. 3. Moderate joint effusion with intra-articular loose bodies. 4. 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 Electronically Signed   By: Kennith Center M.D.   On: 12/05/2022 05:52   MR BRAIN WO CONTRAST  Result Date: 12/04/2022 CLINICAL DATA:  Initial  evaluation for neuro deficit, stroke suspected. EXAM: MRI HEAD WITHOUT CONTRAST TECHNIQUE: Multiplanar, multiecho pulse sequences of the brain and surrounding structures were obtained without intravenous contrast. COMPARISON:  CT from earlier the same day as well as prior brain MRI from 01/08/2022. FINDINGS: Brain: Mild age-related cerebral atrophy. Patchy and confluent T2/FLAIR hyperintensity involving the periventricular deep white matter both cerebral hemispheres. Patchy involvement of the bilateral basal ganglia, thalami, pons, and cerebellum. Changes are most like related chronic microvascular ischemic disease, and are moderately advanced in nature, and progressed as compared to prior brain MRI. No evidence for acute or subacute ischemia. Gray-white matter differentiation maintained. No areas of chronic cortical infarction. No visible acute or chronic intracranial blood products. No mass lesion, midline shift or mass effect. No hydrocephalus or extra-axial fluid collection. Vascular: Major intracranial vascular flow voids are maintained. Skull and upper cervical spine: Craniocervical junction grossly within normal limits. Bone marrow signal intensity grossly normal. No scalp soft tissue abnormality. Sinuses/Orbits: Prior bilateral ocular lens replacement. Paranasal sinuses are largely clear. No significant mastoid effusion. Other: None. IMPRESSION: 1. No acute intracranial abnormality. 2. Age-related cerebral atrophy with moderately advanced chronic microvascular ischemic disease, mildly progressed as compared to most recent brain MRI from 01/08/2022. Electronically Signed   By: Rise Mu M.D.   On: 12/04/2022 23:35   DG Knee Complete 4 Views Left  Result Date: 12/04/2022 CLINICAL DATA:  Fall with swelling and pain EXAM: LEFT KNEE - COMPLETE 4+ VIEW COMPARISON:  05/31/2020, 07/12/2015 FINDINGS: Valgus deformity at the knee. No definitive fracture or dislocation. Stable sclerotic lesion in the  distal femur consistent  with benign finding. Large knee effusion. 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 No definitive acute osseous abnormality. 2. Large knee effusion. If persistent concern for fracture, would correlate with CT 3. Moderate to severe tricompartment arthritis of the knee. Electronically Signed   By: Jasmine Pang M.D.   On: 12/04/2022 20:05   DG Chest Portable 1 View  Result Date: 12/04/2022 CLINICAL DATA:  Fall with subsequent swelling and pain left knee EXAM: PORTABLE CHEST 1 VIEW COMPARISON:  07/30/2005 FINDINGS: Hardware in the cervical spine. No acute airspace disease. Normal cardiomediastinal silhouette. No pneumothorax IMPRESSION: No active disease. Electronically Signed   By: Jasmine Pang M.D.   On: 12/04/2022 20:02   CT Head Wo Contrast  Result Date: 12/04/2022 CLINICAL DATA:  Initial evaluation for acute head trauma, found down. EXAM: CT HEAD WITHOUT CONTRAST TECHNIQUE: Contiguous axial images were obtained from the base of the skull through the vertex without intravenous contrast. RADIATION DOSE REDUCTION: This exam was performed according to the departmental dose-optimization program which includes automated exposure control, adjustment of the mA and/or kV according to patient size and/or use of iterative reconstruction technique. COMPARISON:  Prior study from 01/08/2022. FINDINGS: Brain: Generalized age-related cerebral atrophy with moderately advanced chronic microvascular ischemic disease. No acute intracranial hemorrhage. No acute large vessel territory infarct. No mass lesion or midline shift. Mild ventricular prominence related to global parenchymal volume loss without hydrocephalus. No extra-axial fluid collection. Vascular: No abnormal hyperdense vessel. Calcified atherosclerosis present at the skull base. Skull: No visible scalp soft tissue injury.  Calvarium intact. Sinuses/Orbits:  Globes and orbital soft tissues within normal limits. Paranasal sinuses are clear. No mastoid effusion. Other: None. IMPRESSION: 1. No acute intracranial abnormality. 2. Generalized age-related cerebral atrophy with moderately advanced chronic microvascular ischemic disease. Electronically Signed   By: Rise Mu M.D.   On: 12/04/2022 20:01    Microbiology: Recent Results (from the past 240 hour(s))  Resp panel by RT-PCR (RSV, Flu A&B, Covid) Anterior Nasal Swab     Status: None   Collection Time: 12/04/22  6:28 PM   Specimen: Anterior Nasal Swab  Result Value Ref Range Status   SARS Coronavirus 2 by RT PCR NEGATIVE NEGATIVE Final    Comment: (NOTE) SARS-CoV-2 target nucleic acids are NOT DETECTED.  The SARS-CoV-2 RNA is generally detectable in upper respiratory specimens during the acute phase of infection. The lowest concentration of SARS-CoV-2 viral copies this assay can detect is 138 copies/mL. A negative result does not preclude SARS-Cov-2 infection and should not be used as the sole basis for treatment or other patient management decisions. A negative result may occur with  improper specimen collection/handling, submission of specimen other than nasopharyngeal swab, presence of viral mutation(s) within the areas targeted by this assay, and inadequate number of viral copies(<138 copies/mL). A negative result must be combined with clinical observations, patient history, and epidemiological information. The expected result is Negative.  Fact Sheet for Patients:  BloggerCourse.com  Fact Sheet for Healthcare Providers:  SeriousBroker.it  This test is no t yet approved or cleared by the [place] FDA and  has been authorized for detection and/or diagnosis of SARS-CoV-2 by FDA under an Emergency Use Authorization (EUA). This EUA will remain  in effect (meaning this test can be used) for the duration of the COVID-19  declaration under Section 564(b)(1) of the Act, 21 U.S.C.section 360bbb-3(b)(1), unless the authorization is terminated  or revoked sooner.  Influenza A by PCR NEGATIVE NEGATIVE Final   Influenza B by PCR NEGATIVE NEGATIVE Final    Comment: (NOTE) The Xpert Xpress SARS-CoV-2/FLU/RSV plus assay is intended as an aid in the diagnosis of influenza from Nasopharyngeal swab specimens and should not be used as a sole basis for treatment. Nasal washings and aspirates are unacceptable for Xpert Xpress SARS-CoV-2/FLU/RSV testing.  Fact Sheet for Patients: BloggerCourse.com  Fact Sheet for Healthcare Providers: SeriousBroker.it  This test is not yet approved or cleared by the [place] FDA and has been authorized for detection and/or diagnosis of SARS-CoV-2 by FDA under an Emergency Use Authorization (EUA). This EUA will remain in effect (meaning this test can be used) for the duration of the COVID-19 declaration under Section 564(b)(1) of the Act, 21 U.S.C. section 360bbb-3(b)(1), unless the authorization is terminated or revoked.     Resp Syncytial Virus by PCR NEGATIVE NEGATIVE Final    Comment: (NOTE) Fact Sheet for Patients: BloggerCourse.com  Fact Sheet for Healthcare Providers: SeriousBroker.it  This test is not yet approved or cleared by the [place] FDA and has been authorized for detection and/or diagnosis of SARS-CoV-2 by FDA under an Emergency Use Authorization (EUA). This EUA will remain in effect (meaning this test can be used) for the duration of the COVID-19 declaration under Section 564(b)(1) of the Act, 21 U.S.C. section 360bbb-3(b)(1), unless the authorization is terminated or revoked.  Performed at [facility], [address], [place], [place] [address]   Body fluid culture w Gram Stain     Status: None   Collection Time:  12/05/22  3:00 PM   Specimen: Synovium; Body Fluid  Result Value Ref Range Status   Specimen Description   Final    SYNOVIAL Performed at [facility], [address] [place], [place] [address]    Special Requests   Final    NONE Performed at [facility], [address] [place], [place] [address]    Gram Stain   Final    FEW WBC PRESENT, PREDOMINANTLY PMN NO ORGANISMS SEEN    Culture   Final    NO GROWTH 3 DAYS Performed at [facility], [address] [place], [place] [address]    Report Status 12/09/2022 FINAL  Final  Culture, blood (Routine X 2) w Reflex to ID Panel     Status: None (Preliminary result)   Collection Time: 12/05/22  3:40 PM   Specimen: Site Not Specified; Blood  Result Value Ref Range Status   Specimen Description   Final    SITE NOT SPECIFIED Performed at [facility], [address] [place], [place] [address]    Special Requests   Final    BOTTLES DRAWN AEROBIC ONLY Blood Culture adequate volume Performed at [facility], [address] [place], [place] [address]    Culture   Final    NO GROWTH 4 DAYS Performed at [facility], [address], [place], [place] [address]    Report Status PENDING  Incomplete  Culture, blood (Routine X 2) w Reflex to ID Panel     Status: None (Preliminary result)   Collection Time: 12/05/22  3:45 PM   Specimen: BLOOD  Result Value Ref Range Status   Specimen Description   Final    BLOOD SITE NOT SPECIFIED Performed at [facility], [address] [place], [place] [address]    Special Requests   Final  BOTTLES DRAWN AEROBIC ONLY Blood Culture adequate volume Performed at [facility], [address] [place], [place] [address]    Culture   Final    NO GROWTH 4 DAYS Performed at [facility], [address] [place], [place] [address]    Report Status PENDING  Incomplete      Labs: Basic Metabolic Panel: Recent Labs  Lab 12/05/22 0722 12/06/22 0635 12/07/22 0546 12/08/22 0501 12/09/22 0541  NA 136 139 137 139 139  K 3.0* 3.5 3.5 3.4* 3.0*  CL 99 102 101 104 103  CO2 25 28 27 29 27   GLUCOSE 102* 90 83 100* 98  BUN 12 19 32* 26* 16  CREATININE 0.71 0.82 1.20* 0.95 0.77  CALCIUM 9.8 10.0 9.8 9.3 9.5  MG 1.7  --   --   --   --    Liver Function Tests: Recent Labs  Lab 12/05/22 0014  AST 26  ALT 18  ALKPHOS 55  BILITOT 1.3*  PROT 7.2  ALBUMIN 3.9   No results for input(s): "LIPASE", "AMYLASE" in the last 168 hours. Recent Labs  Lab 12/05/22 0014  AMMONIA 13   CBC: Recent Labs  Lab 12/04/22 1854 12/05/22 0722 12/06/22 0635 12/07/22 0546 12/08/22 0501  WBC 8.1 7.2 5.3 4.8 5.2  NEUTROABS 7.0  --  3.3 3.0 3.3  HGB 13.4 12.6 11.7* 10.5* 10.2*  HCT 41.1 37.4 36.8 32.6* 30.8*  MCV 95.4 93.5 97.6 96.7 94.8  PLT 242 278 267 291 326   Cardiac Enzymes: Recent Labs  Lab 12/05/22 0014  CKTOTAL 331*   BNP: BNP (last 3 results) No results for input(s): "BNP" in the last 8760 hours.  ProBNP (last 3 results) No results for input(s): "PROBNP" in the last 8760 hours.  CBG: No results for input(s): "GLUCAP" in the last 168 hours.     Signed:  Rhetta Mura MD   [facility] 12/09/2022, 10:34 AM

## 2022-12-09 NOTE — Plan of Care (Signed)

## 2022-12-09 NOTE — Plan of Care (Signed)
Orthopedic Plan of Care Note  Aspirate results checked this afternoon. Cultures no growth to date. Since other aspirate results were not concerning for infection, there are still no operative plans at this time. Still recommend symptomatic treatment and patient can weight bear as tolerated. Placed follow up information in the discharge tab.   London Sheer, MD Orthopedic Surgeon

## 2022-12-09 NOTE — TOC Progression Note (Addendum)
Transition of Care [facility]) - Progression Note    Patient Details  Name: Lauren Garrett MRN: [id number] Date of Birth: 1946/02/11  Transition [facility] CM/SW Contact  Otelia Santee, LCSW Phone Number: 12/09/2022, 10:55 AM  Clinical Narrative:    CSW spoke with pt's spouse in room and daughter via t/c to review bed offers for SNF.   The following facilities extended bed offers:  [facility]  Family are to review and discuss bed offers with family/friends prior to making decision.   Pt is able to transfer to SNF pending bed choice, insurance authorization, and PASRR assignment.   Expected Discharge Plan: Skilled Nursing Facility Barriers to Discharge: Family Issues, SNF Pending bed offer  Expected Discharge Plan and Services In-house Referral: Clinical Social Work Discharge Planning Services: NA Post Acute Care Choice: Skilled Nursing Facility Living arrangements for the past 2 months: Single Family Home Expected Discharge Date: 12/09/22               DME Arranged: N/A DME Agency: NA                   Social Determinants of Health (SDOH) Interventions SDOH Screenings   Food Insecurity: No Food Insecurity (12/05/2022)  Housing: Low Risk  (12/05/2022)  Transportation Needs: No Transportation Needs (12/05/2022)  Utilities: Not At Risk (12/05/2022)  Social Connections: Unknown (09/01/2021)   Received from [facility]  Tobacco Use: Low Risk  (12/04/2022)    Readmission Risk Interventions    12/09/2022   10:55 AM  Readmission Risk Prevention Plan  Post Dischage Appt Complete  Medication Screening Complete  Transportation Screening Complete

## 2022-12-09 NOTE — Patient Outreach (Signed)
  Care Coordination   12/09/2022 Name: Lauren Garrett MRN: [id number] DOB: 04-27-1945   Care Coordination Outreach Attempts:  An unsuccessful telephone outreach was attempted for a scheduled appointment today.  Follow Up Plan:  Additional outreach attempts will be made to offer the patient care coordination information and services.   Encounter Outcome:  No Answer   Care Coordination Interventions:  No, not indicated    Delsa Sale, RN, BSN, CCM Care Management Coordinator [facility] Care Management  Direct Phone: [phone]

## 2022-12-10 ENCOUNTER — Telehealth: Payer: Self-pay | Admitting: *Deleted

## 2022-12-10 DIAGNOSIS — G9340 Encephalopathy, unspecified: Secondary | ICD-10-CM | POA: Diagnosis not present

## 2022-12-10 LAB — CULTURE, BLOOD (ROUTINE X 2)
Culture: NO GROWTH
Culture: NO GROWTH
Special Requests: ADEQUATE
Special Requests: ADEQUATE

## 2022-12-10 LAB — LACTATE DEHYDROGENASE: LDH: 170 U/L (ref 98–192)

## 2022-12-10 LAB — CK: Total CK: 92 U/L (ref 38–234)

## 2022-12-10 MED ORDER — HALOPERIDOL LACTATE 5 MG/ML IJ SOLN
1.0000 mg | Freq: Once | INTRAMUSCULAR | Status: AC | PRN
  Administered 2022-12-10: 1 mg via INTRAVENOUS
  Filled 2022-12-10: qty 1

## 2022-12-10 MED ORDER — TRAZODONE HCL 50 MG PO TABS
50.0000 mg | ORAL_TABLET | Freq: Every day | ORAL | Status: DC
  Administered 2022-12-10: 50 mg via ORAL
  Filled 2022-12-10: qty 1

## 2022-12-10 NOTE — Progress Notes (Addendum)
Physical Therapy Treatment Patient Details Name: Lauren Garrett MRN: [id number] DOB: 03-10-1946 Today's Date: 12/10/2022   History of Present Illness 77 y.o. female with medical history significant of HTN, HLD, osteoarthritis (B knees, L more than R), vascular dementia being followed by Dr. Pearlean Brownie.     Pt has been having ongoing progressive decline with respect to vasc dementia (increased confusion, especially at night, etc).  However, this became an acute decline starting 12/01/22 she became increasingly confused, fatigue, lethargic, had BLE weakness, and developed new inability to ambulate (normally ambulates with cane).  Dx of acute encephalopathy.    PT Comments   Pt admitted with above diagnosis.  Pt currently with functional limitations due to the deficits listed below (see PT Problem List). Pt in bed when PT arrived. Spouse present and reported he needed to leave at the beginning of the tx session. Spouse indicated that pt is to d/c to SNF today and pt had a rough night with increased confusion. Pt in bed semi reclined and in no apparent distress. Pt easily roused. Pt exhibited minimal active engagement with therapeutic activities. PT focused on sitting balance EOB and OT arrived and during the course of sitting balance pt indicated need to void. Pt required total A x 2 for squat pivot-like transfer to [facility], pt able to void bladder and then indicated she did not want to get in to bed  and required total A x 2 to perform transfer to recliner. Pt left in recliner all needs in place. Pt will benefit from acute skilled PT to increase their independence and safety with mobility to allow discharge.      If plan is discharge home, recommend the following: Two people to help with walking and/or transfers;Two people to help with bathing/dressing/bathroom;Assist for transportation;Help with stairs or ramp for entrance;Assistance with cooking/housework   Can travel by private vehicle     No  Equipment  Recommendations  None recommended by PT    Recommendations for Other Services       Precautions / Restrictions Precautions Precautions: Fall Precaution Comments: Hx Dementia Restrictions Weight Bearing Restrictions: No     Mobility  Bed Mobility Overal bed mobility: Needs Assistance Bed Mobility: Supine to Sit     Supine to sit: Max assist     General bed mobility comments: pt requires increased time, demonstrates minimal initiation of movement. Spouse present when PT arrived and indicated pt had a "bad" night and now they have hooked her up to "this thing" indicating the pure wick. pt appearted to be lethargic and demonstrated minimal verbal communciation when seated EOB.    Transfers Overall transfer level: Needs assistance Equipment used: None Transfers: Bed to chair/wheelchair/BSC   Stand pivot transfers: +2 physical assistance, +2 safety/equipment, From elevated surface, Total assist         General transfer comment: pt requries extensive assist and constant cues wtih increased time for motor processing and planing and demonstrates minimal initiation of movement to engage with theraputic activity . pt seated EOB and reported need to void bladder. pure wick in place however pt appeared unable to void. transfer from bed to [facility] with total A x 2 and strong cues for pt to grasp PT forearms due to difficulty follow commands for grasp and release of arm rest on BSC. pt required total A x 2 for SPT from [facility] to recliner with pt indicating she did not want to go back to bed. pt maintained flexed posture t/o transfer and absent abiltiy  to advance LEs required for weight shifting and pivoting    Ambulation/Gait               General Gait Details: NT due to extensive assist for transfer task for pt and staff safety   Stairs             Wheelchair Mobility     Tilt Bed    Modified Rankin (Stroke Patients Only)       Balance Overall balance assessment: Needs  assistance   Sitting balance-Leahy Scale: Poor Sitting balance - Comments: PT focused on sitting balance EOB for 11:20 with pt able to maintain static unsupported sitting wtih attention to midline for 2:40s with constant cues, pt regressed with balance with fatigued and exhibited L lateral and posterior lean, pt intermittently able to correct with multimodal cues and CGA however consisitanly required min A to recover from LOB PT noted limited use of L UE and able to facilitate B UE support with improved self recovery stratagies noted Postural control: Posterior lean   Standing balance-Leahy Scale: Zero Standing balance comment: required physical A x 2  to maintian standing                            Cognition Arousal: Alert Behavior During Therapy: Restless Overall Cognitive Status: History of cognitive impairments - at baseline Area of Impairment: Orientation, Memory, Safety/judgement                 Orientation Level: Place, Time, Situation, Disoriented to   Memory: Decreased recall of precautions, Decreased short-term memory   Safety/Judgement: Decreased awareness of safety, Decreased awareness of deficits     General Comments: showing "some" improvement from last session but remains mostly confused and requires repeat simple functional commands to stay on task.  Repeatably asks where her husband is. Difficulty express correct words.  Easily distracted. Very fearful with transfers.        Exercises      General Comments        Pertinent Vitals/Pain Pain Assessment Pain Assessment: Faces Faces Pain Scale: Hurts even more Breathing: normal Negative Vocalization: none Facial Expression: smiling or inexpressive Body Language: relaxed Consolability: no need to console PAINAD Score: 0 Pain Location: back and L knee Pain Descriptors / Indicators: Grimacing, Guarding, Restless Pain Intervention(s): Limited activity within patient's tolerance, Monitored  during session    Home Living Family/patient expects to be discharged to:: Private residence Living Arrangements: Spouse/significant other Available Help at Discharge: Family;Available PRN/intermittently Type of Home: House Home Access: Stairs to enter   Entrance Stairs-Number of Steps: 4 Alternate Level Stairs-Number of Steps: with chair lift Home Layout: Two level Home Equipment: Shower seat;Cane - single point;Other (comment) Additional Comments: daughter was present during session and able to provide some PLOF info    Prior Function            PT Goals (current goals can now be found in the care plan section) Acute Rehab PT Goals Patient Stated Goal: to walk PT Goal Formulation: With family Time For Goal Achievement: 12/19/22 Potential to Achieve Goals: Fair    Frequency    Min 1X/week      PT Plan      Co-evaluation PT/OT/SLP Co-Evaluation/Treatment: Yes Reason for Co-Treatment: Complexity of the patient's impairments (multi-system involvement);Necessary to address cognition/behavior during functional activity;For patient/therapist safety;To address functional/ADL transfers PT goals addressed during session: Mobility/safety with mobility;Balance OT goals addressed during session: ADL's  and self-care      AM-PAC PT "6 Clicks" Mobility   Outcome Measure  Help needed turning from your back to your side while in a flat bed without using bedrails?: Total Help needed moving from lying on your back to sitting on the side of a flat bed without using bedrails?: Total Help needed moving to and from a bed to a chair (including a wheelchair)?: Total Help needed standing up from a chair using your arms (e.g., wheelchair or bedside chair)?: Total Help needed to walk in hospital room?: Total Help needed climbing 3-5 steps with a railing? : Total 6 Click Score: 6    End of Session Equipment Utilized During Treatment: Gait belt Activity Tolerance: Patient tolerated  treatment well Patient left: in chair;with call bell/phone within reach Nurse Communication: Mobility status PT Visit Diagnosis: Other abnormalities of gait and mobility (R26.89);Pain Pain - Right/Left: Left Pain - part of body: Knee (back)     Time: 6295-2841 PT Time Calculation (min) (ACUTE ONLY): 33 min  Charges:    $Neuromuscular Re-education: 8-22 mins PT General Charges $$ ACUTE PT VISIT: 1 Visit                     Johnny Bridge, PT Acute Rehab    Jacqualyn Posey 12/10/2022, 12:38 PM

## 2022-12-10 NOTE — Progress Notes (Signed)
PROGRESS NOTE    Lauren Garrett  ZOX:[id number] DOB: 08-Sep-1945 DOA: 12/04/2022 PCP: Gaspar Garbe, MD  Chief Complaint  Patient presents with   Fall   Altered Mental Status   Weakness    Brief Narrative:   76 home dwelling with husband  moderate vascular dementia MMSE 19/30 in 03/2022 followed by Dr Pearlean Brownie, lumbosacral degeneration and chronic bilateral knee issues osteoarthritis of knees previously conservatively managed at [facility] orthopedics by Dr. [place] Has been seen in the past by Dr. Franky Macho to discuss repeat back surgery Previous low TSH of 0.44 with adjustment of Synthroid ANA positive no suggestion of lupus   Has primarily small fiber neuropathy and Aricept was discussed at last neurology office patient was supposed to take it but was noncompliant Patient was encouraged to perform cognitively challenging activities by Dr Pearlean Brownie at his last office visit 04/02/2022   Patient has had progressive decline and this became acute to subacute on 8/12 patient was found on the floor sounds like she fell patient was cleaned up perked up but still tired and patient came to the ED   Knee x-ray left side showed large left effusion, CT left knee showed tricompartmental degenerative changes in the lateral compartment with moderate joint effusion and loose intra-articular bodies with 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 Christell Constant of [facility] orthopedics consulted regarding effusion?  Septic arthritis-- appreciative of his care and collegiality and he performed Nehan Flaum knee tap See  below  Assessment & Plan:   Principal Problem:   Acute encephalopathy Active Problems:   Bilateral primary osteoarthritis of knee   Vascular dementia (HCC)   Hypercholesteremia   HTN (hypertension)   Acute pain of left knee  Left Knee Effusion Osteoarthritis  425 nucleated cells, 76% neutrophils (no crystals seen) -> in setting of this as well as CT findings with tricompartmental degenerative changes, suspect this is all related to OA.  Body fluid culture Ngx3 days. Orthopedics c/s, appreciate recs - no operative plans, symptomatic treatment, weight bear as tolerated.  Follow with orthopedics outpatient.    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  Extensive cervical fusion.  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  Thoracic formainl impingement on R at T1-2, T2-3, and T11-12.  Needs to follow outpatient with Dr. Franky Macho    Hypokalemia Replace and follow    HTN Stop avapro 8/18-, stop triamterene hydrochlorothiazide Felt that some of the AKI was secondary to diuretics as well as ARB BP on higher side, will monitor prior to discharge   AKI resolved   Vascular dementia followed by Dr Pearlean Brownie Delirium  Needs repeat MMSE-last 1 done was 19/30-was not taking Aricept-have started it here and she has suffered no side effects Required haldol 8/20-21 PM, will start trazodone Delirium precautions   Hypothyroidism Continue  Synthroid 88 Requires TSH in about 4 weeks    DVT prophylaxis: lovenox Code Status: full Family Communication: husband Disposition:   Status is: Inpatient Remains inpatient appropriate because: need for inpatient care   Consultants:   orthopedics  Procedures:  none  Antimicrobials:  Anti-infectives (From admission, onward)    None       Subjective: Pleasantly confused  Objective: Vitals:   12/09/22 0657 12/09/22 1214 12/09/22 1919 12/10/22 1408  BP: (!) 165/76 (!) 147/62 (!) 181/65 (!) 157/66  Pulse: 81 86 77 84  Resp:  16 17 20   Temp:  98.2 F (36.8 C) 98 F (36.7 C)   TempSrc:  Oral Oral   SpO2:  100% 100% 100%  Weight:      Height:        Intake/Output Summary (Last 24 hours) at 12/10/2022 1633 Last data filed at 12/10/2022 1003 Gross per 24 hour  Intake 118 ml  Output --  Net 118 ml   Filed Weights   12/04/22 1752 12/05/22 0520  Weight: 62 kg 59.7 kg    Examination:  General exam: Appears calm and comfortable  Respiratory system: unlabored Cardiovascular system: RRR Central nervous system: pleasantly confused Extremities: no LEE, L knee swelling    Data Reviewed: I have personally reviewed following labs and imaging studies  CBC: Recent Labs  Lab 12/04/22 1854 12/05/22 0722 12/06/22 0635 12/07/22 0546 12/08/22 0501 12/09/22 1337  WBC 8.1 7.2 5.3 4.8 5.2 4.0  NEUTROABS 7.0  --  3.3 3.0 3.3 2.4  HGB 13.4 12.6 11.7* 10.5* 10.2* 12.0  HCT 41.1 37.4 36.8 32.6* 30.8* 37.8  MCV 95.4 93.5 97.6 96.7 94.8 96.4  PLT 242 278 267 291 326 406*    Basic Metabolic Panel: Recent Labs  Lab 12/05/22 0722 12/06/22 0635 12/07/22 0546 12/08/22 0501 12/09/22 0541  NA 136 139 137 139 139  K 3.0* 3.5 3.5 3.4* 3.0*  CL 99 102 101 104 103  CO2 25 28 27 29 27   GLUCOSE 102* 90 83 100* 98  BUN 12 19 32* 26* 16  CREATININE 0.71 0.82 1.20* 0.95 0.77  CALCIUM 9.8 10.0 9.8 9.3 9.5  MG 1.7  --   --   --   --     GFR: Estimated Creatinine Clearance: 58.1 mL/min (by C-G formula based on SCr of 0.77 mg/dL).  Liver Function Tests: Recent Labs  Lab 12/05/22 0014  AST 26  ALT 18  ALKPHOS 55  BILITOT 1.3*  PROT 7.2  ALBUMIN 3.9    CBG: No results for input(s): "GLUCAP" in the  last 168 hours.   Recent Results (from the past 240 hour(s))  Resp panel by RT-PCR (RSV, Flu Jarrid Lienhard&B, Covid) Anterior Nasal Swab     Status: None   Collection Time: 12/04/22  6:28 PM   Specimen: Anterior Nasal Swab  Result Value Ref Range Status   SARS Coronavirus 2 by RT PCR NEGATIVE NEGATIVE Final    Comment: (NOTE) SARS-CoV-2 target nucleic acids are NOT DETECTED.  The SARS-CoV-2 RNA is generally detectable in upper respiratory specimens during the acute phase of infection. The lowest concentration of SARS-CoV-2 viral copies this assay can detect is 138 copies/mL. Shakya Sebring negative result does not preclude SARS-Cov-2 infection and should not be used as the sole basis for treatment or other patient management decisions. Mateya Torti negative result may occur with  improper specimen collection/handling, submission of specimen other than nasopharyngeal swab, presence of viral mutation(s) within the areas targeted by this assay, and  inadequate number of viral copies(<138 copies/mL). Ailah Barna negative result must be combined with clinical observations, patient history, and epidemiological information. The expected result is Negative.  Fact Sheet for Patients:  BloggerCourse.com  Fact Sheet for Healthcare Providers:  SeriousBroker.it  This test is no t yet approved or cleared by the [place] FDA and  has been authorized for detection and/or diagnosis of SARS-CoV-2 by FDA under an Emergency Use Authorization (EUA). This EUA will remain  in effect (meaning this test can be used) for the duration of the COVID-19 declaration under Section 564(b)(1) of the Act, 21 U.S.C.section 360bbb-3(b)(1), unless the authorization is terminated  or revoked sooner.       Influenza Gaylin Osoria by PCR NEGATIVE NEGATIVE Final   Influenza B by PCR NEGATIVE NEGATIVE Final    Comment: (NOTE) The Xpert Xpress SARS-CoV-2/FLU/RSV plus assay is intended as an aid in the diagnosis of  influenza from Nasopharyngeal swab specimens and should not be used as Jakirah Zaun sole basis for treatment. Nasal washings and aspirates are unacceptable for Xpert Xpress SARS-CoV-2/FLU/RSV testing.  Fact Sheet for Patients: BloggerCourse.com  Fact Sheet for Healthcare Providers: SeriousBroker.it  This test is not yet approved or cleared by the [place] FDA and has been authorized for detection and/or diagnosis of SARS-CoV-2 by FDA under an Emergency Use Authorization (EUA). This EUA will remain in effect (meaning this test can be used) for the duration of the COVID-19 declaration under Section 564(b)(1) of the Act, 21 U.S.C. section 360bbb-3(b)(1), unless the authorization is terminated or revoked.     Resp Syncytial Virus by PCR NEGATIVE NEGATIVE Final    Comment: (NOTE) Fact Sheet for Patients: BloggerCourse.com  Fact Sheet for Healthcare Providers: SeriousBroker.it  This test is not yet approved or cleared by the [place] FDA and has been authorized for detection and/or diagnosis of SARS-CoV-2 by FDA under an Emergency Use Authorization (EUA). This EUA will remain in effect (meaning this test can be used) for the duration of the COVID-19 declaration under Section 564(b)(1) of the Act, 21 U.S.C. section 360bbb-3(b)(1), unless the authorization is terminated or revoked.  Performed at [facility], [address] [place], [place] [address]   Body fluid culture w Gram Stain     Status: None   Collection Time: 12/05/22  3:00 PM   Specimen: Synovium; Body Fluid  Result Value Ref Range Status   Specimen Description   Final    SYNOVIAL Performed at [facility], [address], [place], [place] [address]    Special Requests   Final    NONE Performed at [facility], [address] [place], [place] [address]    Gram  Stain   Final    FEW WBC PRESENT, PREDOMINANTLY PMN NO ORGANISMS SEEN    Culture   Final    NO GROWTH 3 DAYS Performed at [facility], [address] [place], [place] [address]    Report Status 12/09/2022 FINAL  Final  Culture, blood (Routine X 2) w Reflex to ID Panel     Status: None   Collection Time: 12/05/22  3:40 PM   Specimen: Site Not Specified; Blood  Result Value Ref Range Status   Specimen Description   Final    SITE NOT SPECIFIED Performed at [facility], [address] [place], [place] [address]    Special Requests   Final    BOTTLES DRAWN AEROBIC ONLY Blood Culture adequate volume Performed at [facility]  [facility], [address] [place], [place] [address]    Culture   Final    NO GROWTH 5 DAYS Performed at [facility], [address] [place], [place] [address]    Report Status 12/10/2022 FINAL  Final  Culture, blood (Routine X 2) w Reflex to ID Panel     Status: None   Collection Time: 12/05/22  3:45 PM   Specimen: BLOOD  Result Value Ref Range Status   Specimen Description   Final    BLOOD SITE NOT SPECIFIED Performed at [facility], [address] [place], [place] [address]    Special Requests   Final    BOTTLES DRAWN AEROBIC ONLY Blood Culture adequate volume Performed at [facility], [address] [place], [place] [address]    Culture   Final    NO GROWTH 5 DAYS Performed at [facility], [address] [place], [place] [address]    Report Status 12/10/2022 FINAL  Final         Radiology Studies: No results found.      Scheduled Meds:  atorvastatin  20 mg Oral Daily   cholecalciferol  1,000 Units Oral Daily   donepezil  5 mg Oral QHS   enoxaparin (LOVENOX) injection  40 mg Subcutaneous Q24H   iron polysaccharides  150 mg Oral BID   levothyroxine  88 mcg Oral Q0600   multivitamin with minerals  1 tablet Oral Daily   potassium chloride  40  mEq Oral Daily   traZODone  50 mg Oral QHS   Continuous Infusions:   LOS: 4 days    Time spent: over 30 min    Lacretia Nicks, MD [facility]   To contact the attending provider between 7A-7P or the covering provider during after hours 7P-7A, please log into the web site [email or web address] and access using universal [place] password for that web site. If you do not have the password, please call the hospital operator.  12/10/2022, 4:33 PM

## 2022-12-10 NOTE — Plan of Care (Signed)

## 2022-12-10 NOTE — Progress Notes (Signed)
Occupational Therapy Treatment Patient Details Name: Lauren Garrett MRN: [id number] DOB: October 11, 1945 Today's Date: 12/10/2022   History of present illness 77 y.o. female with medical history significant of HTN, HLD, osteoarthritis (B knees, L more than R), vascular dementia being followed by Dr. Pearlean Brownie.     Pt has been having ongoing progressive decline with respect to vasc dementia (increased confusion, especially at night, etc).  However, this became an acute decline starting 12/01/22 she became increasingly confused, fatigue, lethargic, had BLE weakness, and developed new inability to ambulate (normally ambulates with cane).  Dx of acute encephalopathy.   OT comments  Patient was +2 to engage in transfers on this date with increased fear of falling with patient grabbing out to hold onto handles and having difficult time following cues to let go. Patient was educated on proper hand placement to assist with transfers with patient unable to carryover during session. Patient's family cannot assist patient at current level of assistance. Patient's discharge plan remains appropriate at this time. OT will continue to follow acutely.        If plan is discharge home, recommend the following:  Two people to help with walking and/or transfers;A lot of help with bathing/dressing/bathroom;Assistance with cooking/housework;Direct supervision/assist for medications management;Assist for transportation;Help with stairs or ramp for entrance;Direct supervision/assist for financial management;Assistance with feeding;Supervision due to cognitive status   Equipment Recommendations  None recommended by OT       Precautions / Restrictions Precautions Precautions: Fall Precaution Comments: Hx Dementia Restrictions Weight Bearing Restrictions: No          Balance Overall balance assessment: Needs assistance   Sitting balance-Leahy Scale: Poor Sitting balance - Comments: posterior leaning when sitting on  edge of chair with patient seeking for back of chair. Postural control: Posterior lean             ADL either performed or assessed with clinical judgement   ADL Overall ADL's : Needs assistance/impaired           Toilet Transfer: +2 for physical assistance;+2 for safety/equipment;Maximal assistance Toilet Transfer Details (indicate cue type and reason): TD to SPT to [facility] and then to recliner with patient fearful with movements and grabbing out onto handles of objects. Toileting- Clothing Manipulation and Hygiene: Sitting/lateral lean;Total assistance Toileting - Clothing Manipulation Details (indicate cue type and reason): with +2 for safety.            Extremity/Trunk Assessment     Lower Extremity Assessment Lower Extremity Assessment: Difficult to assess due to impaired cognition LLE Sensation: WNL (per pt report)   Cervical / Trunk Assessment Cervical / Trunk Assessment: Kyphotic     Cognition Arousal: Alert Behavior During Therapy: Restless Overall Cognitive Status: History of cognitive impairments - at baseline       General Comments: showing "some" improvement from last session but remains mostly confused and requires repeat simple functional commands to stay on task.  Repeatably asks where her husband is. Difficulty express correct words.  Easily distracted. Very fearful with transfers.                   Pertinent Vitals/ Pain       Pain Assessment Pain Assessment: Faces Faces Pain Scale: Hurts even more Pain Location: back and L knee Pain Descriptors / Indicators: Grimacing, Guarding, Restless Pain Intervention(s): Limited activity within patient's tolerance, Monitored during session         Frequency  Min 1X/week  Progress Toward Goals  OT Goals(current goals can now be found in the care plan section)  Progress towards OT goals: OT to reassess next treatment     Plan      Co-evaluation      Reason for Co-Treatment:  Complexity of the patient's impairments (multi-system involvement);Necessary to address cognition/behavior during functional activity;For patient/therapist safety;To address functional/ADL transfers PT goals addressed during session: Mobility/safety with mobility;Balance OT goals addressed during session: ADL's and self-care      AM-PAC OT "6 Clicks" Daily Activity     Outcome Measure   Help from another person eating meals?: A Little Help from another person taking care of personal grooming?: A Little Help from another person toileting, which includes using toliet, bedpan, or urinal?: Total Help from another person bathing (including washing, rinsing, drying)?: Total Help from another person to put on and taking off regular upper body clothing?: A Little Help from another person to put on and taking off regular lower body clothing?: Total 6 Click Score: 12    End of Session Equipment Utilized During Treatment: Gait belt;Other (comment) (BSC)  OT Visit Diagnosis: Unsteadiness on feet (R26.81);Other abnormalities of gait and mobility (R26.89);Pain Pain - Right/Left: Left Pain - part of body: Knee   Activity Tolerance Patient limited by fatigue;Patient limited by pain   Patient Left in chair;with call bell/phone within reach;with chair alarm set   Nurse Communication          Time: 2841-3244 OT Time Calculation (min): 17 min  Charges: OT General Charges $OT Visit: 1 Visit OT Treatments $Therapeutic Activity: 8-22 mins  Rosalio Loud, MS Acute Rehabilitation Department Office# [phone]   Selinda Flavin 12/10/2022, 12:29 PM

## 2022-12-10 NOTE — Progress Notes (Signed)
  Care Coordination Note  12/10/2022 Name: Lauren Garrett MRN: [id number] DOB: 02-11-1946  Lauren Garrett is a 77 y.o. year old female who is a primary care patient of Tisovec, Adelfa Koh, MD and is actively engaged with the care management team. I reached out to Abe People by phone today to assist with re-scheduling a follow up visit with the RN Case Manager  Follow up plan: Unsuccessful telephone outreach attempt made.  [facility]  Care Coordination Care Guide  Direct Dial: [phone]

## 2022-12-10 NOTE — TOC Progression Note (Signed)
Transition of Care [facility]) - Progression Note    Patient Details  Name: LYNESE FISCHEL MRN: [id number] Date of Birth: [birth year]  [facility]) CM/SW Contact  Otelia Santee, LCSW Phone Number: 12/10/2022, 12:05 PM  Clinical Narrative:    Pt's PASRR review complete and PASRR assigned: [id number].  Pt's spouse has accepted bed offer for SNF at [facility].  Insurance auth requested and approved. Auth ID: [id number].     Expected Discharge Plan: Skilled Nursing Facility Barriers to Discharge: Barriers Resolved  Expected Discharge Plan and Services In-house Referral: Clinical Social Work Discharge Planning Services: NA Post Acute Care Choice: Skilled Nursing Facility Living arrangements for the past 2 months: Single Family Home Expected Discharge Date: 12/09/22               DME Arranged: N/A DME Agency: NA                   Social Determinants of Health (SDOH) Interventions SDOH Screenings   Food Insecurity: No Food Insecurity (12/05/2022)  Housing: Low Risk  (12/05/2022)  Transportation Needs: No Transportation Needs (12/05/2022)  Utilities: Not At Risk (12/05/2022)  Social Connections: Unknown (09/01/2021)   Received from [facility]  Tobacco Use: Low Risk  (12/04/2022)    Readmission Risk Interventions    12/09/2022   10:55 AM  Readmission Risk Prevention Plan  Post Dischage Appt Complete  Medication Screening Complete  Transportation Screening Complete

## 2022-12-11 DIAGNOSIS — R531 Weakness: Secondary | ICD-10-CM | POA: Diagnosis not present

## 2022-12-11 DIAGNOSIS — R413 Other amnesia: Secondary | ICD-10-CM | POA: Diagnosis not present

## 2022-12-11 DIAGNOSIS — M25561 Pain in right knee: Secondary | ICD-10-CM | POA: Diagnosis not present

## 2022-12-11 DIAGNOSIS — G9009 Other idiopathic peripheral autonomic neuropathy: Secondary | ICD-10-CM | POA: Diagnosis not present

## 2022-12-11 DIAGNOSIS — E8809 Other disorders of plasma-protein metabolism, not elsewhere classified: Secondary | ICD-10-CM | POA: Diagnosis not present

## 2022-12-11 DIAGNOSIS — R410 Disorientation, unspecified: Secondary | ICD-10-CM | POA: Diagnosis not present

## 2022-12-11 DIAGNOSIS — E785 Hyperlipidemia, unspecified: Secondary | ICD-10-CM | POA: Diagnosis not present

## 2022-12-11 DIAGNOSIS — R32 Unspecified urinary incontinence: Secondary | ICD-10-CM | POA: Diagnosis not present

## 2022-12-11 DIAGNOSIS — R262 Difficulty in walking, not elsewhere classified: Secondary | ICD-10-CM | POA: Diagnosis not present

## 2022-12-11 DIAGNOSIS — R5381 Other malaise: Secondary | ICD-10-CM | POA: Diagnosis not present

## 2022-12-11 DIAGNOSIS — M170 Bilateral primary osteoarthritis of knee: Secondary | ICD-10-CM | POA: Diagnosis not present

## 2022-12-11 DIAGNOSIS — F01518 Vascular dementia, unspecified severity, with other behavioral disturbance: Secondary | ICD-10-CM | POA: Diagnosis not present

## 2022-12-11 DIAGNOSIS — G4700 Insomnia, unspecified: Secondary | ICD-10-CM | POA: Diagnosis not present

## 2022-12-11 DIAGNOSIS — E46 Unspecified protein-calorie malnutrition: Secondary | ICD-10-CM | POA: Diagnosis not present

## 2022-12-11 DIAGNOSIS — F02818 Dementia in other diseases classified elsewhere, unspecified severity, with other behavioral disturbance: Secondary | ICD-10-CM | POA: Diagnosis not present

## 2022-12-11 DIAGNOSIS — R52 Pain, unspecified: Secondary | ICD-10-CM | POA: Diagnosis not present

## 2022-12-11 DIAGNOSIS — E7800 Pure hypercholesterolemia, unspecified: Secondary | ICD-10-CM | POA: Diagnosis not present

## 2022-12-11 DIAGNOSIS — N390 Urinary tract infection, site not specified: Secondary | ICD-10-CM | POA: Diagnosis not present

## 2022-12-11 DIAGNOSIS — D649 Anemia, unspecified: Secondary | ICD-10-CM | POA: Diagnosis not present

## 2022-12-11 DIAGNOSIS — E876 Hypokalemia: Secondary | ICD-10-CM | POA: Diagnosis not present

## 2022-12-11 DIAGNOSIS — M6281 Muscle weakness (generalized): Secondary | ICD-10-CM | POA: Diagnosis not present

## 2022-12-11 DIAGNOSIS — Z7189 Other specified counseling: Secondary | ICD-10-CM | POA: Diagnosis not present

## 2022-12-11 DIAGNOSIS — R7989 Other specified abnormal findings of blood chemistry: Secondary | ICD-10-CM | POA: Diagnosis not present

## 2022-12-11 DIAGNOSIS — R451 Restlessness and agitation: Secondary | ICD-10-CM | POA: Diagnosis not present

## 2022-12-11 DIAGNOSIS — M549 Dorsalgia, unspecified: Secondary | ICD-10-CM | POA: Diagnosis not present

## 2022-12-11 DIAGNOSIS — R41841 Cognitive communication deficit: Secondary | ICD-10-CM | POA: Diagnosis not present

## 2022-12-11 DIAGNOSIS — M13862 Other specified arthritis, left knee: Secondary | ICD-10-CM | POA: Diagnosis not present

## 2022-12-11 DIAGNOSIS — G309 Alzheimer's disease, unspecified: Secondary | ICD-10-CM | POA: Diagnosis not present

## 2022-12-11 DIAGNOSIS — I10 Essential (primary) hypertension: Secondary | ICD-10-CM | POA: Diagnosis not present

## 2022-12-11 DIAGNOSIS — M79675 Pain in left toe(s): Secondary | ICD-10-CM | POA: Diagnosis not present

## 2022-12-11 DIAGNOSIS — F0150 Vascular dementia without behavioral disturbance: Secondary | ICD-10-CM | POA: Diagnosis not present

## 2022-12-11 DIAGNOSIS — M545 Low back pain, unspecified: Secondary | ICD-10-CM | POA: Diagnosis not present

## 2022-12-11 DIAGNOSIS — M5459 Other low back pain: Secondary | ICD-10-CM | POA: Diagnosis not present

## 2022-12-11 DIAGNOSIS — Z7401 Bed confinement status: Secondary | ICD-10-CM | POA: Diagnosis not present

## 2022-12-11 DIAGNOSIS — M25462 Effusion, left knee: Secondary | ICD-10-CM | POA: Diagnosis not present

## 2022-12-11 DIAGNOSIS — M25551 Pain in right hip: Secondary | ICD-10-CM | POA: Diagnosis not present

## 2022-12-11 DIAGNOSIS — G9340 Encephalopathy, unspecified: Secondary | ICD-10-CM | POA: Diagnosis not present

## 2022-12-11 DIAGNOSIS — E039 Hypothyroidism, unspecified: Secondary | ICD-10-CM | POA: Diagnosis not present

## 2022-12-11 DIAGNOSIS — R4189 Other symptoms and signs involving cognitive functions and awareness: Secondary | ICD-10-CM | POA: Diagnosis not present

## 2022-12-11 DIAGNOSIS — Z87440 Personal history of urinary (tract) infections: Secondary | ICD-10-CM | POA: Diagnosis not present

## 2022-12-11 DIAGNOSIS — G894 Chronic pain syndrome: Secondary | ICD-10-CM | POA: Diagnosis not present

## 2022-12-11 DIAGNOSIS — M25562 Pain in left knee: Secondary | ICD-10-CM | POA: Diagnosis not present

## 2022-12-11 LAB — CBC WITH DIFFERENTIAL/PLATELET
Abs Immature Granulocytes: 0.01 10*3/uL (ref 0.00–0.07)
Basophils Absolute: 0 10*3/uL (ref 0.0–0.1)
Basophils Relative: 1 %
Eosinophils Absolute: 0.2 10*3/uL (ref 0.0–0.5)
Eosinophils Relative: 6 %
HCT: 33.3 % — ABNORMAL LOW (ref 36.0–46.0)
Hemoglobin: 10.8 g/dL — ABNORMAL LOW (ref 12.0–15.0)
Immature Granulocytes: 0 %
Lymphocytes Relative: 43 %
Lymphs Abs: 1.5 10*3/uL (ref 0.7–4.0)
MCH: 31.5 pg (ref 26.0–34.0)
MCHC: 32.4 g/dL (ref 30.0–36.0)
MCV: 97.1 fL (ref 80.0–100.0)
Monocytes Absolute: 0.4 10*3/uL (ref 0.1–1.0)
Monocytes Relative: 11 %
Neutro Abs: 1.3 10*3/uL — ABNORMAL LOW (ref 1.7–7.7)
Neutrophils Relative %: 39 %
Platelets: 363 10*3/uL (ref 150–400)
RBC: 3.43 MIL/uL — ABNORMAL LOW (ref 3.87–5.11)
RDW: 12.8 % (ref 11.5–15.5)
WBC: 3.5 10*3/uL — ABNORMAL LOW (ref 4.0–10.5)
nRBC: 0 % (ref 0.0–0.2)

## 2022-12-11 LAB — COMPREHENSIVE METABOLIC PANEL
ALT: 36 U/L (ref 0–44)
AST: 43 U/L — ABNORMAL HIGH (ref 15–41)
Albumin: 3 g/dL — ABNORMAL LOW (ref 3.5–5.0)
Alkaline Phosphatase: 53 U/L (ref 38–126)
Anion gap: 5 (ref 5–15)
BUN: 21 mg/dL (ref 8–23)
CO2: 29 mmol/L (ref 22–32)
Calcium: 9.6 mg/dL (ref 8.9–10.3)
Chloride: 104 mmol/L (ref 98–111)
Creatinine, Ser: 0.89 mg/dL (ref 0.44–1.00)
GFR, Estimated: >60 mL/min (ref >60)
Glucose, Bld: 102 mg/dL — ABNORMAL HIGH (ref 70–99)
Potassium: 4.1 mmol/L (ref 3.5–5.1)
Sodium: 138 mmol/L (ref 135–145)
Total Bilirubin: 0.4 mg/dL (ref 0.3–1.2)
Total Protein: 5.8 g/dL — ABNORMAL LOW (ref 6.5–8.1)

## 2022-12-11 LAB — PHOSPHORUS: Phosphorus: 3.2 mg/dL (ref 2.5–4.6)

## 2022-12-11 LAB — MAGNESIUM: Magnesium: 1.9 mg/dL (ref 1.7–2.4)

## 2022-12-11 MED ORDER — TRAZODONE HCL 50 MG PO TABS: 50.0000 mg | ORAL_TABLET | Freq: Every day | ORAL | Status: AC

## 2022-12-11 NOTE — TOC Transition Note (Signed)
Transition of Care [facility]) - CM/SW Discharge Note   Patient Details  Name: Lauren Garrett MRN: [id number] Date of Birth: January 09, 1946  [facility]) CM/SW Contact:  Otelia Santee, LCSW Phone Number: 12/11/2022, 12:32 PM   Clinical Narrative:    Pt is to transfer to [facility] for rehab. Pt will be going to room 101a. RN to call report to [phone]. Pt's spouse and son have been notified of transfer and are agreeable to plan. PTAR called for transportation at 12:30pm. DC packet created and placed at RN station.    Final next level of care: Skilled Nursing Facility Barriers to Discharge: Barriers Resolved   Patient Goals and CMS Choice CMS Medicare.gov Compare Post Acute Care list provided to:: Patient Represenative (must comment) Choice offered to / list presented to : Spouse  Discharge Placement PASRR number recieved: 12/10/22 PASRR number recieved: 12/10/22            Patient chooses bed at: [facility] Patient to be transferred to facility by: PTAR Name of family member notified: Spouse, Remi Deter Patient and family notified of of transfer: 12/11/22  Discharge Plan and Services Additional resources added to the After Visit Summary for   In-house Referral: Clinical Social Work Discharge Planning Services: NA Post Acute Care Choice: Skilled Nursing Facility          DME Arranged: N/A DME Agency: NA                  Social Determinants of Health (SDOH) Interventions SDOH Screenings   Food Insecurity: No Food Insecurity (12/05/2022)  Housing: Low Risk  (12/05/2022)  Transportation Needs: No Transportation Needs (12/05/2022)  Utilities: Not At Risk (12/05/2022)  Social Connections: Unknown (09/01/2021)   Received from [facility]  Tobacco Use: Low Risk  (12/04/2022)     Readmission Risk Interventions    12/09/2022   10:55 AM  Readmission Risk Prevention Plan  Post Dischage Appt Complete  Medication Screening Complete   Transportation Screening Complete

## 2022-12-11 NOTE — Plan of Care (Signed)

## 2022-12-11 NOTE — Care Management Important Message (Signed)
Important Message  Patient Details IM Letter given. Name: Lauren Garrett MRN: [id number] Date of Birth: 11-24-1945   Medicare Important Message Given:  Yes     Caren Macadam 12/11/2022, 11:33 AM

## 2022-12-11 NOTE — Discharge Summary (Signed)
Physician Discharge Summary  Lauren Garrett UJW:[id number] DOB: [birth year] DOA: 12/04/2022  PCP: Lauren Garbe, MD  Admit date: 12/04/2022 Discharge date: 12/11/2022  Time spent: 40 minutes  Recommendations for Outpatient Follow-up:  Follow outpatient CBC/CMP  Pending ANA, ENA, anti CCP at time of discharge Follow blood pressures outpatient, adjust BP regimen as appropriate Needs repeat TSH in 4 weeks Follow with orthopedics outpatient   For concern for myositis, follow with rheum outpatient Needs follow up with Dr. Franky Garrett outpatient Follow up with neurology for dementia Follow conjunctival hemorrhage, if no improvement, consider ophtho follow up   Discharge Diagnoses:  Principal Problem:   Acute encephalopathy Active Problems:   Bilateral primary osteoarthritis of knee   Vascular dementia (HCC)   Hypercholesteremia   HTN (hypertension)   Acute pain of left knee   Discharge Condition: stable  Diet recommendation: heart healthy  Filed Weights   12/04/22 1752 12/05/22 0520  Weight: 62 kg 59.7 kg    History of present illness:   77 yo with vascular dementia, chronic bilateral knee OA, lumbosacral degeneration and hx spine surgery who presented with progressive decline, found down.  Was found to have large knee effusion on L knee.  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  Now s/p aspiration by orthopedics.    Hospitalization has been complicated by delirium.  Imaging findings concerning for myositis.  At this point, stable for discharge to SNF.  See below and previous notes for additional details.   Hospital Course:  Assessment and Plan:  Left Knee Effusion Osteoarthritis  425 nucleated cells, 76% neutrophils (no crystals seen) -> in setting of this as well as CT findings with  tricompartmental degenerative changes, suspect this is all related to OA.  Body fluid culture Ngx3 days. Orthopedics c/s, appreciate recs - no operative plans, symptomatic treatment, weight bear as tolerated.  Follow with orthopedics outpatient.     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  Extensive cervical fusion.  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  Thoracic formainl impingement on R at T1-2, T2-3, and T11-12.  Needs to follow outpatient with Dr. Franky Garrett    Hypokalemia Replace and follow    HTN avapro, triamterene hydrochlorothiazide were stopped AKI was thought secondary to diuretics as well as ARB Follow blood pressures outpatient and adjust as indicated   AKI resolved   Vascular dementia followed by Dr Lauren Garrett Delirium  Trazodone nightly Aricept Worsening cognitive status, follow outpatient with Dr. Pearlean Garrett 77 MRI with age related cerebral atrophy with moderately advanced chronic microvascular ischemic disease, mildly progressed Delirium precautions   Hypothyroidism Continue Synthroid 88 Requires TSH in about 4 weeks  Conjunctival Hemorrhage To L eye, follow closely - unclear cause - trauma? Will need outpatient follow up      Procedures: none   Consultations: ortho  Discharge Exam: Vitals:   12/10/22 2027 12/11/22 0533  BP: (!) 151/83 (!) 161/73  Pulse: 85 73  Resp: 18 18  Temp: 98.4 F (36.9 C) 98.4 F (  36.9 C)  SpO2: 99% 99%   No new complaints  General: No acute distress. Limited exam of L eye due to inability to cooperate, PERRL  Cardiovascular: RRR Lungs:  unlabored Abdomen: Soft, nontender, nondistended No significant TTP of back, no visible rash or erythema  Neurological: Alert, pleasantly confused. Moves all extremities 4.  Extremities: No clubbing or cyanosis. No edema.   Discharge Instructions   Discharge Instructions     Ambulatory referral to Rheumatology   Complete by: As directed    Call MD for:  difficulty breathing, headache or visual disturbances   Complete by: As directed    Call MD for:  extreme fatigue   Complete by: As directed    Call MD for:  hives   Complete by: As directed    Call MD for:  persistant dizziness or light-headedness   Complete by: As directed    Call MD for:  persistant nausea and vomiting   Complete by: As directed    Call MD for:  redness, tenderness, or signs of infection (pain, swelling, redness, odor or green/yellow discharge around incision site)   Complete by: As directed    Call MD for:  severe uncontrolled pain   Complete by: As directed    Call MD for:  temperature >100.4   Complete by: As directed    Diet - low sodium heart healthy   Complete by: As directed    Diet - low sodium heart healthy   Complete by: As directed    Discharge instructions   Complete by: As directed    You were seen after Lauren Garrett fall.  You had left knee swelling.  The fluid was sampled and was suggestive of osteoarthritis (arthritis due to wear and tear).  Please follow up with orthopedics after discharge (you should call to make an appointment).  For now, for pain, use tylenol or voltaren gel as needed.    You had confusion (delirium) which I think was the result of your dementia in addition to the hospitalization and other acute processes going on.  [name] should be frequently reoriented.  Blinds should be opened in the morning and TV's off at night.  If [name] wears glasses or hearing aids, these should be readily available to her.  Family present is always helpful.  She should follow with her  neurologist as an outpatient for her confusion/dementia.  I'll prescribe trazodone to help with sleep.   Your chronic spine issues should be followed with Dr. Franky Garrett as an outpatient.   Your MRI showed myositis (inflammation of the muscles).  It's unclear to me what the cause or significance of this is at this time.  I sent an ANA lab test as well as an ENA lab test and anti CCP test which are pending at the time of discharge.  Please follow up these results with your PCP or SNF provider outpatient.  I also made Gerrick Ray referral to rheumatology.  You need repeat TSH in 4 weeks (thyroid test).    We've stopped some blood pressure medicines.  Your outpatient providers should continue to monitor your blood pressure and adjust regimens as needed.  Return for new, recurrent, or worsening symptoms.  Please ask your PCP to request records from this hospitalization so they know what was done and what the next steps will be.   Increase activity slowly   Complete by: As directed    Increase activity slowly   Complete by: As directed  Allergies as of 12/11/2022       Reactions   Other    Pt feels she is allergic to another med but unsure of name   Topamax [topiramate] Itching   Macrobid [nitrofurantoin Monohydrate Macrocrystals] Itching, Rash        Medication List     STOP taking these medications    aspirin EC 81 MG tablet   diclofenac 75 MG EC tablet Commonly known as: VOLTAREN   irbesartan 300 MG tablet Commonly known as: AVAPRO   tiZANidine 4 MG tablet Commonly known as: ZANAFLEX   triamterene-hydrochlorothiazide 37.5-25 MG tablet Commonly known as: MAXZIDE-25       TAKE these medications    acetaminophen 325 MG tablet Commonly known as: TYLENOL Take 2 tablets (650 mg total) by mouth every 6 (six) hours as needed for mild pain (or Fever >/= 101).   atorvastatin 20 MG tablet Commonly known as: LIPITOR Take 20 mg by mouth daily.   cholecalciferol 25 MCG (1000  UNIT) tablet Commonly known as: VITAMIN D3 Take 1,000 Units by mouth daily.   diclofenac sodium 1 % Gel Commonly known as: VOLTAREN Apply 2 g topically 4 (four) times daily as needed (pain).   donepezil 5 MG tablet Commonly known as: ARICEPT Take 1 tablet (5 mg total) by mouth at bedtime.   iron polysaccharides 150 MG capsule Commonly known as: NIFEREX Take 150 mg by mouth 2 (two) times daily. Nu iron   multivitamin with minerals Tabs tablet Take 1 tablet by mouth daily.   ondansetron 4 MG tablet Commonly known as: ZOFRAN Take 1 tablet (4 mg total) by mouth every 6 (six) hours as needed for nausea.   potassium chloride SA 20 MEQ tablet Commonly known as: KLOR-CON M Take 2 tablets (40 mEq total) by mouth daily.   Synthroid 88 MCG tablet Generic drug: levothyroxine Take 88 mcg by mouth daily.   traZODone 50 MG tablet Commonly known as: DESYREL Take 1 tablet (50 mg total) by mouth at bedtime.       Allergies  Allergen Reactions   Other     Pt feels she is allergic to another med but unsure of name   Topamax [Topiramate] Itching   Macrobid [Nitrofurantoin Monohydrate Macrocrystals] Itching and Rash      The results of significant diagnostics from this hospitalization (including imaging, microbiology, ancillary and laboratory) are listed below for reference.    Significant Diagnostic Studies: MR CERVICAL SPINE WO CONTRAST  Result Date: 12/05/2022 CLINICAL DATA:  Acute myelopathy EXAM: MRI CERVICAL, THORACIC AND LUMBAR SPINE WITHOUT CONTRAST TECHNIQUE: Multiplanar and multiecho pulse sequences of the cervical spine, to include the craniocervical junction and cervicothoracic junction, and thoracic and lumbar spine, were obtained without intravenous contrast. COMPARISON:  Lumbar MRI 06/26/2022 FINDINGS: MRI CERVICAL SPINE FINDINGS Alignment: Straightening of cervical lordosis with mild C3-4 anterolisthesis that is degenerative Vertebrae: ACDF with solid arthrodesis at  C6-T1 scratch the ACDF from C 4 to T1. Solid arthrodesis at C5-6 and C7-T1 at least. Cord: Small area of discrete T2 hyperintensity in the right cord at the level of C4-5. Posterior Fossa, vertebral arteries, paraspinal tissues: The intrinsic neck muscles show diffuse STIR hyperintensity. No collection. Disc levels: C2-3: Degenerative facet spurring on the left more than right with small central protrusion and eccentric left uncovertebral ridging. Moderate left foraminal impingement. C3-4: Degenerative facet spurring which is bulky. There is Lomax Poehler central protrusion which is upward pointing. Uncovertebral spurring on both sides. Right foraminal impingement to Maximina Pirozzi moderate degree.  C4-5: ACDF.  No impingement C5-6: ACDF and solid arthrodesis.  No impingement C6-7: ACDF. Biforaminal impingement from uncovertebral spurring and height loss. C7-T1:ACDF with solid arthrodesis. Right foraminal impingement from disc height loss and hypertrophic bone. MRI THORACIC SPINE FINDINGS Alignment:  No traumatic malalignment.  Mild dextrocurvature Vertebrae: No fracture, evidence of discitis, or bone lesion. Cord:  Normal signal and morphology. Paraspinal and other soft tissues: Diffuse T2 hyperintensity within intrinsic back muscles. Involvement is symmetric. No collection is seen. Disc levels: Generalized disc desiccation in keeping with age. More notable degeneration at T11-12 where there is disc height loss and bulging. Facet spurring at this level causes biforaminal stenosis with moderate impingement greater on the left. Also notable is T1-2 and T2-3 right facet spurring causing foraminal impingement. Diffusely patent spinal canal. MRI LUMBAR SPINE FINDINGS Segmentation:  Standard. Alignment:  Degenerative scoliosis Vertebrae:  No fracture, evidence of discitis, or bone lesion. Conus medullaris and cauda equina: Conus extends to the L1 level. Conus and cauda equina appear normal. Paraspinal and other soft tissues: Muscular edema  symmetrically in the intrinsic back muscles. No evidence of collection. Disc levels: T12- L1: Unremarkable. L1-L2: Unremarkable. L2-L3: Mild disc bulging and facet spurring L3-L4: Disc collapse with gas containing and fluid containing fissure in the disc space. Eccentric right endplate and facet spurring with advanced right foraminal impingement. Advanced spinal stenosis L4-L5: Gas and fluid containing disc fissure with height loss and bulging eccentric to the right where there is also greater endplate and facet spurring. Severe right foraminal impingement. Severe spinal stenosis. Left foraminal impingement is moderate L5-S1:Disc collapse with endplate ridging and facet spurring eccentric to the left where there is severe foraminal impingement. Moderate left subarticular recess stenosis. IMPRESSION: 1. Generalized intrinsic back muscle edema. Given the diffuse nature and symmetry myositis is favored over strain. 2. 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 Severe left foraminal impingement at L5-S1. 3. Extensive cervical fusion. Levels of residual foraminal impingement described above. Mild myelomalacia in the cord on the right at C4-5, Anniebell Bedore level of prior fusion. 4. Thoracic foraminal impingement on the right at T1-2, T2-3 and to Yasmin Bronaugh lesser extent bilaterally at T11-12. Electronically Signed   By: Tiburcio Pea M.D.   On: 12/05/2022 12:35   MR THORACIC SPINE WO CONTRAST  Result Date: 12/05/2022 CLINICAL DATA:  Acute myelopathy EXAM: MRI CERVICAL, THORACIC AND LUMBAR SPINE WITHOUT CONTRAST TECHNIQUE: Multiplanar and multiecho pulse sequences of the cervical spine, to include the craniocervical junction and cervicothoracic junction, and thoracic and lumbar spine, were obtained without intravenous contrast. COMPARISON:  Lumbar MRI 06/26/2022 FINDINGS: MRI CERVICAL SPINE FINDINGS Alignment: Straightening of cervical lordosis with mild C3-4 anterolisthesis that  is degenerative Vertebrae: ACDF with solid arthrodesis at C6-T1 scratch the ACDF from C 4 to T1. Solid arthrodesis at C5-6 and C7-T1 at least. Cord: Small area of discrete T2 hyperintensity in the right cord at the level of C4-5. Posterior Fossa, vertebral arteries, paraspinal tissues: The intrinsic neck muscles show diffuse STIR hyperintensity. No collection. Disc levels: C2-3: Degenerative facet spurring on the left more than right with small central protrusion and eccentric left uncovertebral ridging. Moderate left foraminal impingement. C3-4: Degenerative facet spurring which is bulky. There is Naeemah Jasmer central protrusion which is upward pointing. Uncovertebral spurring on both sides. Right foraminal impingement to Sharmain Lastra moderate degree. C4-5: ACDF.  No impingement C5-6: ACDF and solid arthrodesis.  No impingement C6-7: ACDF. Biforaminal impingement from uncovertebral spurring and height loss. C7-T1:ACDF  with solid arthrodesis. Right foraminal impingement from disc height loss and hypertrophic bone. MRI THORACIC SPINE FINDINGS Alignment:  No traumatic malalignment.  Mild dextrocurvature Vertebrae: No fracture, evidence of discitis, or bone lesion. Cord:  Normal signal and morphology. Paraspinal and other soft tissues: Diffuse T2 hyperintensity within intrinsic back muscles. Involvement is symmetric. No collection is seen. Disc levels: Generalized disc desiccation in keeping with age. More notable degeneration at T11-12 where there is disc height loss and bulging. Facet spurring at this level causes biforaminal stenosis with moderate impingement greater on the left. Also notable is T1-2 and T2-3 right facet spurring causing foraminal impingement. Diffusely patent spinal canal. MRI LUMBAR SPINE FINDINGS Segmentation:  Standard. Alignment:  Degenerative scoliosis Vertebrae:  No fracture, evidence of discitis, or bone lesion. Conus medullaris and cauda equina: Conus extends to the L1 level. Conus and cauda equina appear  normal. Paraspinal and other soft tissues: Muscular edema symmetrically in the intrinsic back muscles. No evidence of collection. Disc levels: T12- L1: Unremarkable. L1-L2: Unremarkable. L2-L3: Mild disc bulging and facet spurring L3-L4: Disc collapse with gas containing and fluid containing fissure in the disc space. Eccentric right endplate and facet spurring with advanced right foraminal impingement. Advanced spinal stenosis L4-L5: Gas and fluid containing disc fissure with height loss and bulging eccentric to the right where there is also greater endplate and facet spurring. Severe right foraminal impingement. Severe spinal stenosis. Left foraminal impingement is moderate L5-S1:Disc collapse with endplate ridging and facet spurring eccentric to the left where there is severe foraminal impingement. Moderate left subarticular recess stenosis. IMPRESSION: 1. Generalized intrinsic back muscle edema. Given the diffuse nature and symmetry myositis is favored over strain. 2. 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 Severe left foraminal impingement at L5-S1. 3. Extensive cervical fusion. Levels of residual foraminal impingement described above. Mild myelomalacia in the cord on the right at C4-5, Hurschel Paynter level of prior fusion. 4. Thoracic foraminal impingement on the right at T1-2, T2-3 and to Karman Biswell lesser extent bilaterally at T11-12. Electronically Signed   By: Tiburcio Pea M.D.   On: 12/05/2022 12:35   MR LUMBAR SPINE WO CONTRAST  Result Date: 12/05/2022 CLINICAL DATA:  Acute myelopathy EXAM: MRI CERVICAL, THORACIC AND LUMBAR SPINE WITHOUT CONTRAST TECHNIQUE: Multiplanar and multiecho pulse sequences of the cervical spine, to include the craniocervical junction and cervicothoracic junction, and thoracic and lumbar spine, were obtained without intravenous contrast. COMPARISON:  Lumbar MRI 06/26/2022 FINDINGS: MRI CERVICAL SPINE FINDINGS Alignment: Straightening  of cervical lordosis with mild C3-4 anterolisthesis that is degenerative Vertebrae: ACDF with solid arthrodesis at C6-T1 scratch the ACDF from C 4 to T1. Solid arthrodesis at C5-6 and C7-T1 at least. Cord: Small area of discrete T2 hyperintensity in the right cord at the level of C4-5. Posterior Fossa, vertebral arteries, paraspinal tissues: The intrinsic neck muscles show diffuse STIR hyperintensity. No collection. Disc levels: C2-3: Degenerative facet spurring on the left more than right with small central protrusion and eccentric left uncovertebral ridging. Moderate left foraminal impingement. C3-4: Degenerative facet spurring which is bulky. There is Kasara Schomer central protrusion which is upward pointing. Uncovertebral spurring on both sides. Right foraminal impingement to Niasia Lanphear moderate degree. C4-5: ACDF.  No impingement C5-6: ACDF and solid arthrodesis.  No impingement C6-7: ACDF. Biforaminal impingement from uncovertebral spurring and height loss. C7-T1:ACDF with solid arthrodesis. Right foraminal impingement from disc height loss and hypertrophic bone. MRI THORACIC SPINE FINDINGS Alignment:  No traumatic malalignment.  Mild  dextrocurvature Vertebrae: No fracture, evidence of discitis, or bone lesion. Cord:  Normal signal and morphology. Paraspinal and other soft tissues: Diffuse T2 hyperintensity within intrinsic back muscles. Involvement is symmetric. No collection is seen. Disc levels: Generalized disc desiccation in keeping with age. More notable degeneration at T11-12 where there is disc height loss and bulging. Facet spurring at this level causes biforaminal stenosis with moderate impingement greater on the left. Also notable is T1-2 and T2-3 right facet spurring causing foraminal impingement. Diffusely patent spinal canal. MRI LUMBAR SPINE FINDINGS Segmentation:  Standard. Alignment:  Degenerative scoliosis Vertebrae:  No fracture, evidence of discitis, or bone lesion. Conus medullaris and cauda equina: Conus  extends to the L1 level. Conus and cauda equina appear normal. Paraspinal and other soft tissues: Muscular edema symmetrically in the intrinsic back muscles. No evidence of collection. Disc levels: T12- L1: Unremarkable. L1-L2: Unremarkable. L2-L3: Mild disc bulging and facet spurring L3-L4: Disc collapse with gas containing and fluid containing fissure in the disc space. Eccentric right endplate and facet spurring with advanced right foraminal impingement. Advanced spinal stenosis L4-L5: Gas and fluid containing disc fissure with height loss and bulging eccentric to the right where there is also greater endplate and facet spurring. Severe right foraminal impingement. Severe spinal stenosis. Left foraminal impingement is moderate L5-S1:Disc collapse with endplate ridging and facet spurring eccentric to the left where there is severe foraminal impingement. Moderate left subarticular recess stenosis. IMPRESSION: 1. Generalized intrinsic back muscle edema. Given the diffuse nature and symmetry myositis is favored over strain. 2. 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 Severe left foraminal impingement at L5-S1. 3. Extensive cervical fusion. Levels of residual foraminal impingement described above. Mild myelomalacia in the cord on the right at C4-5, Lasaundra Riche level of prior fusion. 4. Thoracic foraminal impingement on the right at T1-2, T2-3 and to Bertrand Vowels lesser extent bilaterally at T11-12. Electronically Signed   By: Tiburcio Pea M.D.   On: 12/05/2022 12:35   CT KNEE LEFT WO CONTRAST  Result Date: 12/05/2022 CLINICAL DATA:  Joint effusion after Kyran Connaughton fall. EXAM: CT OF THE LEFT KNEE WITHOUT CONTRAST TECHNIQUE: Multidetector CT imaging of the left knee was performed according to the standard protocol. Multiplanar CT image reconstructions were also generated. RADIATION DOSE REDUCTION: This exam was performed according to the departmental dose-optimization program which  includes automated exposure control, adjustment of the mA and/or kV according to patient size and/or use of iterative reconstruction technique. COMPARISON:  X-rays from earlier same day FINDINGS: Bones/Joint/Cartilage No evidence for an acute fracture no dislocation. Anterior subluxation of the lateral femoral condyle is likely degenerative. There is loss of joint space in the lateral compartment with lateral compartment subchondral sclerosis and cyst formation and extensive osteophytic change. Prominent degenerative spurring is seen in the medial and patellofemoral compartments as well. Intra-articular loose bodies are seen both anteriorly and posteriorly. Moderate joint effusion evident. 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 Ligaments Suboptimally assessed by CT. Muscles and Tendons Unremarkable. Soft tissues Unremarkable. IMPRESSION: 1. No evidence for an acute fracture or dislocation. 2. Tricompartmental degenerative changes, most severe in the lateral compartment. 3. Moderate joint effusion with intra-articular loose bodies. 4. 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 Electronically Signed   By: Kennith Center M.D.   On: 12/05/2022 05:52   MR BRAIN WO CONTRAST  Result Date: 12/04/2022 CLINICAL DATA:  Initial evaluation for  neuro deficit, stroke suspected. EXAM: MRI HEAD WITHOUT CONTRAST TECHNIQUE: Multiplanar, multiecho pulse sequences of the brain and surrounding structures were obtained without intravenous contrast. COMPARISON:  CT from earlier the same day as well as prior brain MRI from 01/08/2022. FINDINGS: Brain: Mild age-related cerebral atrophy. Patchy and confluent T2/FLAIR hyperintensity involving the periventricular deep white matter both cerebral hemispheres. Patchy involvement of the bilateral basal ganglia, thalami, pons, and cerebellum. Changes are most like related chronic microvascular  ischemic disease, and are moderately advanced in nature, and progressed as compared to prior brain MRI. No evidence for acute or subacute ischemia. Gray-white matter differentiation maintained. No areas of chronic cortical infarction. No visible acute or chronic intracranial blood products. No mass lesion, midline shift or mass effect. No hydrocephalus or extra-axial fluid collection. Vascular: Major intracranial vascular flow voids are maintained. Skull and upper cervical spine: Craniocervical junction grossly within normal limits. Bone marrow signal intensity grossly normal. No scalp soft tissue abnormality. Sinuses/Orbits: Prior bilateral ocular lens replacement. Paranasal sinuses are largely clear. No significant mastoid effusion. Other: None. IMPRESSION: 1. No acute intracranial abnormality. 2. Age-related cerebral atrophy with moderately advanced chronic microvascular ischemic disease, mildly progressed as compared to most recent brain MRI from 01/08/2022. Electronically Signed   By: Rise Mu M.D.   On: 12/04/2022 23:35   DG Knee Complete 4 Views Left  Result Date: 12/04/2022 CLINICAL DATA:  Fall with swelling and pain EXAM: LEFT KNEE - COMPLETE 4+ VIEW COMPARISON:  05/31/2020, 07/12/2015 FINDINGS: Valgus deformity at the knee. No definitive fracture or dislocation. Stable sclerotic lesion in the distal femur consistent with benign finding. Large knee effusion. 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 No definitive acute osseous abnormality. 2. Large knee effusion. If persistent concern for fracture, would correlate with CT 3. Moderate to severe tricompartment arthritis of the knee. Electronically Signed   By: Jasmine Pang M.D.   On: 12/04/2022 20:05   DG Chest Portable 1 View  Result Date: 12/04/2022 CLINICAL DATA:  Fall with subsequent swelling and pain left knee EXAM: PORTABLE CHEST 1 VIEW COMPARISON:   07/30/2005 FINDINGS: Hardware in the cervical spine. No acute airspace disease. Normal cardiomediastinal silhouette. No pneumothorax IMPRESSION: No active disease. Electronically Signed   By: Jasmine Pang M.D.   On: 12/04/2022 20:02   CT Head Wo Contrast  Result Date: 12/04/2022 CLINICAL DATA:  Initial evaluation for acute head trauma, found down. EXAM: CT HEAD WITHOUT CONTRAST TECHNIQUE: Contiguous axial images were obtained from the base of the skull through the vertex without intravenous contrast. RADIATION DOSE REDUCTION: This exam was performed according to the departmental dose-optimization program which includes automated exposure control, adjustment of the mA and/or kV according to patient size and/or use of iterative reconstruction technique. COMPARISON:  Prior study from 01/08/2022. FINDINGS: Brain: Generalized age-related cerebral atrophy with moderately advanced chronic microvascular ischemic disease. No acute intracranial hemorrhage. No acute large vessel territory infarct. No mass lesion or midline shift. Mild ventricular prominence related to global parenchymal volume loss without hydrocephalus. No extra-axial fluid collection. Vascular: No abnormal hyperdense vessel. Calcified atherosclerosis present at the skull base. Skull: No visible scalp soft tissue injury.  Calvarium intact. Sinuses/Orbits: Globes and orbital soft tissues within normal limits. Paranasal sinuses are clear. No mastoid effusion. Other: None. IMPRESSION: 1. No acute intracranial abnormality. 2. Generalized age-related cerebral atrophy with moderately advanced chronic microvascular ischemic disease. Electronically Signed   By: Rise Mu M.D.   On:  12/04/2022 20:01    Microbiology: Recent Results (from the past 240 hour(s))  Resp panel by RT-PCR (RSV, Flu Quinisha Mould&B, Covid) Anterior Nasal Swab     Status: None   Collection Time: 12/04/22  6:28 PM   Specimen: Anterior Nasal Swab  Result Value Ref Range Status   SARS  Coronavirus 2 by RT PCR NEGATIVE NEGATIVE Final    Comment: (NOTE) SARS-CoV-2 target nucleic acids are NOT DETECTED.  The SARS-CoV-2 RNA is generally detectable in upper respiratory specimens during the acute phase of infection. The lowest concentration of SARS-CoV-2 viral copies this assay can detect is 138 copies/mL. Kongmeng Santoro negative result does not preclude SARS-Cov-2 infection and should not be used as the sole basis for treatment or other patient management decisions. Jassen Sarver negative result may occur with  improper specimen collection/handling, submission of specimen other than nasopharyngeal swab, presence of viral mutation(s) within the areas targeted by this assay, and inadequate number of viral copies(<138 copies/mL). Tavone Caesar negative result must be combined with clinical observations, patient history, and epidemiological information. The expected result is Negative.  Fact Sheet for Patients:  BloggerCourse.com  Fact Sheet for Healthcare Providers:  SeriousBroker.it  This test is no t yet approved or cleared by the [place] FDA and  has been authorized for detection and/or diagnosis of SARS-CoV-2 by FDA under an Emergency Use Authorization (EUA). This EUA will remain  in effect (meaning this test can be used) for the duration of the COVID-19 declaration under Section 564(b)(1) of the Act, 21 U.S.C.section 360bbb-3(b)(1), unless the authorization is terminated  or revoked sooner.       Influenza Kayen Grabel by PCR NEGATIVE NEGATIVE Final   Influenza B by PCR NEGATIVE NEGATIVE Final    Comment: (NOTE) The Xpert Xpress SARS-CoV-2/FLU/RSV plus assay is intended as an aid in the diagnosis of influenza from Nasopharyngeal swab specimens and should not be used as Gita Dilger sole basis for treatment. Nasal washings and aspirates are unacceptable for Xpert Xpress SARS-CoV-2/FLU/RSV testing.  Fact Sheet for  Patients: BloggerCourse.com  Fact Sheet for Healthcare Providers: SeriousBroker.it  This test is not yet approved or cleared by the [place] FDA and has been authorized for detection and/or diagnosis of SARS-CoV-2 by FDA under an Emergency Use Authorization (EUA). This EUA will remain in effect (meaning this test can be used) for the duration of the COVID-19 declaration under Section 564(b)(1) of the Act, 21 U.S.C. section 360bbb-3(b)(1), unless the authorization is terminated or revoked.     Resp Syncytial Virus by PCR NEGATIVE NEGATIVE Final    Comment: (NOTE) Fact Sheet for Patients: BloggerCourse.com  Fact Sheet for Healthcare Providers: SeriousBroker.it  This test is not yet approved or cleared by the [place] FDA and has been authorized for detection and/or diagnosis of SARS-CoV-2 by FDA under an Emergency Use Authorization (EUA). This EUA will remain in effect (meaning this test can be used) for the duration of the COVID-19 declaration under Section 564(b)(1) of the Act, 21 U.S.C. section 360bbb-3(b)(1), unless the authorization is terminated or revoked.  Performed at [facility], [address], [place], [place] [address]   Body fluid culture w Gram Stain     Status: None   Collection Time: 12/05/22  3:00 PM   Specimen: Synovium; Body Fluid  Result Value Ref Range Status   Specimen Description   Final    SYNOVIAL Performed at [facility], [address], [place], [place] [address]    Special Requests   Final  NONE Performed at [facility], [address] [place], [place] [address]    Gram Stain   Final    FEW WBC PRESENT, PREDOMINANTLY PMN NO ORGANISMS SEEN    Culture   Final    NO GROWTH 3 DAYS Performed at [facility], [address] [place], [place] [address]    Report Status  12/09/2022 FINAL  Final  Culture, blood (Routine X 2) w Reflex to ID Panel     Status: None   Collection Time: 12/05/22  3:40 PM   Specimen: Site Not Specified; Blood  Result Value Ref Range Status   Specimen Description   Final    SITE NOT SPECIFIED Performed at [facility], [address], [place], [place] [address]    Special Requests   Final    BOTTLES DRAWN AEROBIC ONLY Blood Culture adequate volume Performed at [facility], [name], [place], [place] [address]    Culture   Final    NO GROWTH 5 DAYS Performed at [facility], [address], [place], [place] [address]    Report Status 12/10/2022 FINAL  Final  Culture, blood (Routine X 2) w Reflex to ID Panel     Status: None   Collection Time: 12/05/22  3:45 PM   Specimen: BLOOD  Result Value Ref Range Status   Specimen Description   Final    BLOOD SITE NOT SPECIFIED Performed at [facility], [address], [place], [place] [address]    Special Requests   Final    BOTTLES DRAWN AEROBIC ONLY Blood Culture adequate volume Performed at [facility], [address], [place], [place] [address]    Culture   Final    NO GROWTH 5 DAYS Performed at [facility], [address] [place], [place] [address]    Report Status 12/10/2022 FINAL  Final     Labs: Basic Metabolic Panel: Recent Labs  Lab 12/05/22 0722 12/06/22 0635 12/07/22 0546 12/08/22 0501 12/09/22 0541 12/11/22 0525  NA 136 139 137 139 139 138  K 3.0* 3.5 3.5 3.4* 3.0* 4.1  CL 99 102 101 104 103 104  CO2 25 28 27 29 27 29   GLUCOSE 102* 90 83 100* 98 102*  BUN 12 19 32* 26* 16 21  CREATININE 0.71 0.82 1.20* 0.95 0.77 0.89  CALCIUM 9.8 10.0 9.8 9.3 9.5 9.6  MG 1.7  --   --   --   --  1.9  PHOS  --   --   --   --   --  3.2   Liver Function Tests: Recent Labs  Lab 12/05/22 0014 12/11/22 0525  AST 26 43*  ALT 18 36  ALKPHOS 55 53  BILITOT 1.3* 0.4  PROT  7.2 5.8*  ALBUMIN 3.9 3.0*   No results for input(s): "LIPASE", "AMYLASE" in the last 168 hours. Recent Labs  Lab 12/05/22 0014  AMMONIA 13   CBC: Recent Labs  Lab 12/06/22 0635 12/07/22 0546 12/08/22 0501 12/09/22 1337 12/11/22 0525  WBC 5.3 4.8 5.2 4.0 3.5*  NEUTROABS 3.3 3.0 3.3 2.4 1.3*  HGB 11.7* 10.5* 10.2* 12.0 10.8*  HCT 36.8 32.6* 30.8* 37.8 33.3*  MCV 97.6 96.7 94.8 96.4 97.1  PLT 267 291 326 406* 363   Cardiac Enzymes: Recent Labs  Lab 12/05/22 0014 12/10/22 1944  CKTOTAL 331* 92   BNP: BNP (last 3 results) No results for input(s): "  BNP" in the last 8760 hours.  ProBNP (last 3 results) No results for input(s): "PROBNP" in the last 8760 hours.  CBG: No results for input(s): "GLUCAP" in the last 168 hours.     Signed:  Lacretia Nicks MD.  [facility] 12/11/2022, 11:53 AM

## 2022-12-12 LAB — ANTIEXTRACTABLE NUCLEAR AG
ENA SM Ab Ser-aCnc: <0.2 AI (ref 0.0–0.9)
Ribonucleic Protein: 1.1 AI — ABNORMAL HIGH (ref 0.0–0.9)

## 2022-12-13 DIAGNOSIS — E876 Hypokalemia: Secondary | ICD-10-CM | POA: Diagnosis not present

## 2022-12-13 DIAGNOSIS — R531 Weakness: Secondary | ICD-10-CM | POA: Diagnosis not present

## 2022-12-13 DIAGNOSIS — E039 Hypothyroidism, unspecified: Secondary | ICD-10-CM | POA: Diagnosis not present

## 2022-12-13 DIAGNOSIS — G4700 Insomnia, unspecified: Secondary | ICD-10-CM | POA: Diagnosis not present

## 2022-12-13 DIAGNOSIS — F0150 Vascular dementia without behavioral disturbance: Secondary | ICD-10-CM | POA: Diagnosis not present

## 2022-12-13 DIAGNOSIS — I10 Essential (primary) hypertension: Secondary | ICD-10-CM | POA: Diagnosis not present

## 2022-12-13 DIAGNOSIS — M25462 Effusion, left knee: Secondary | ICD-10-CM | POA: Diagnosis not present

## 2022-12-13 DIAGNOSIS — R52 Pain, unspecified: Secondary | ICD-10-CM | POA: Diagnosis not present

## 2022-12-13 DIAGNOSIS — E785 Hyperlipidemia, unspecified: Secondary | ICD-10-CM | POA: Diagnosis not present

## 2022-12-13 LAB — ENA+DNA/DS+ANTICH+CENTRO+JO...
Anti JO-1: <0.2 AI (ref 0.0–0.9)
Centromere Ab Screen: <0.2 AI (ref 0.0–0.9)
Chromatin Ab SerPl-aCnc: <0.2 AI (ref 0.0–0.9)
ENA SM Ab Ser-aCnc: <0.2 AI (ref 0.0–0.9)
Ribonucleic Protein: 1 AI — ABNORMAL HIGH (ref 0.0–0.9)
SSA (Ro) (ENA) Antibody, IgG: <0.2 AI (ref 0.0–0.9)
SSB (La) (ENA) Antibody, IgG: <0.2 AI (ref 0.0–0.9)
Scleroderma (Scl-70) (ENA) Antibody, IgG: <0.2 AI (ref 0.0–0.9)
ds DNA Ab: 1 [IU]/mL (ref 0–9)

## 2022-12-13 LAB — ANA W/REFLEX IF POSITIVE: Anti Nuclear Antibody (ANA): POSITIVE — AB

## 2022-12-14 DIAGNOSIS — F0150 Vascular dementia without behavioral disturbance: Secondary | ICD-10-CM | POA: Diagnosis not present

## 2022-12-14 DIAGNOSIS — R531 Weakness: Secondary | ICD-10-CM | POA: Diagnosis not present

## 2022-12-15 DIAGNOSIS — E7800 Pure hypercholesterolemia, unspecified: Secondary | ICD-10-CM | POA: Diagnosis not present

## 2022-12-15 DIAGNOSIS — I10 Essential (primary) hypertension: Secondary | ICD-10-CM | POA: Diagnosis not present

## 2022-12-15 DIAGNOSIS — F0150 Vascular dementia without behavioral disturbance: Secondary | ICD-10-CM | POA: Diagnosis not present

## 2022-12-16 DIAGNOSIS — R451 Restlessness and agitation: Secondary | ICD-10-CM | POA: Diagnosis not present

## 2022-12-16 DIAGNOSIS — N390 Urinary tract infection, site not specified: Secondary | ICD-10-CM | POA: Diagnosis not present

## 2022-12-16 DIAGNOSIS — D649 Anemia, unspecified: Secondary | ICD-10-CM | POA: Diagnosis not present

## 2022-12-16 DIAGNOSIS — R7989 Other specified abnormal findings of blood chemistry: Secondary | ICD-10-CM | POA: Diagnosis not present

## 2022-12-17 DIAGNOSIS — R531 Weakness: Secondary | ICD-10-CM | POA: Diagnosis not present

## 2022-12-17 DIAGNOSIS — R410 Disorientation, unspecified: Secondary | ICD-10-CM | POA: Diagnosis not present

## 2022-12-17 DIAGNOSIS — F0150 Vascular dementia without behavioral disturbance: Secondary | ICD-10-CM | POA: Diagnosis not present

## 2022-12-17 DIAGNOSIS — M25462 Effusion, left knee: Secondary | ICD-10-CM | POA: Diagnosis not present

## 2022-12-17 NOTE — Progress Notes (Signed)
  Care Coordination Note  12/17/2022 Name: [name] MRN: [id number] DOB: 1945/09/24  Lauren Garrett is a 77 y.o. year old female who is a primary care patient of Tisovec, Adelfa Koh, MD and is actively engaged with the care management team. I reached out to Abe People by phone today to assist with re-scheduling a follow up visit with the RN Case Manager  Follow up plan: Telephone appointment with care management team member scheduled for:12/31/22  [facility] Coordination Care Guide  Direct Dial: [phone]

## 2022-12-18 DIAGNOSIS — R451 Restlessness and agitation: Secondary | ICD-10-CM | POA: Diagnosis not present

## 2022-12-18 DIAGNOSIS — N390 Urinary tract infection, site not specified: Secondary | ICD-10-CM | POA: Diagnosis not present

## 2022-12-18 DIAGNOSIS — R52 Pain, unspecified: Secondary | ICD-10-CM | POA: Diagnosis not present

## 2022-12-18 LAB — CYCLIC CITRUL PEPTIDE ANTIBODY, IGG/IGA: CCP Antibodies IgG/IgA: 5 U (ref 0–19)

## 2022-12-19 ENCOUNTER — Telehealth: Payer: Self-pay | Admitting: Licensed Clinical Social Worker

## 2022-12-19 DIAGNOSIS — M79675 Pain in left toe(s): Secondary | ICD-10-CM | POA: Diagnosis not present

## 2022-12-19 DIAGNOSIS — R531 Weakness: Secondary | ICD-10-CM | POA: Diagnosis not present

## 2022-12-19 DIAGNOSIS — R451 Restlessness and agitation: Secondary | ICD-10-CM | POA: Diagnosis not present

## 2022-12-19 DIAGNOSIS — F0150 Vascular dementia without behavioral disturbance: Secondary | ICD-10-CM | POA: Diagnosis not present

## 2022-12-19 NOTE — Patient Outreach (Signed)
  Care Coordination   Initial Visit Note   12/19/2022 Name: Lauren Garrett MRN: [id number] DOB: [birth year]  Lauren Garrett is a 77 y.o. year old female who sees Tisovec, Adelfa Koh, MD for primary care. I spoke with  Lauren Garrett's daughter by phone today.  What matters to the patients health and wellness today?  Supportive Resources    Goals Addressed             This Visit's Progress    Obtain Supportive Resources-Symptom Management   On track    Activities and task to complete in order to accomplish goals.   Keep all upcoming appointments discussed today Continue with compliance of taking medication prescribed by Doctor Implement healthy coping skills discussed to assist with management of symptoms Continue working with [facility] care team to assist with goals identified Follow up with SNF to discuss continued discharge planning for otpt care         SDOH assessments and interventions completed:  No     Care Coordination Interventions:  Yes, provided  Interventions Today    Flowsheet Row Most Recent Value  Chronic Disease   Chronic disease during today's visit Hypertension (HTN), Other  [Vascular dementia, GAD]  General Interventions   General Interventions Discussed/Reviewed General Interventions Reviewed, [facility], Doctor Visits, Level of Care  Doctor Visits Discussed/Reviewed Doctor Visits Reviewed  Level of Care Personal Care Services, Skilled Nursing Facility  Mental Health Interventions   Mental Health Discussed/Reviewed Anxiety       Follow up plan: Follow up call scheduled for 12/23/22    Encounter Outcome:  Pt. Visit Completed   Jenel Lucks, MSW, LCSW [facility] Care Management [facility]  [facility] [email or web address] Phone [phone] 6:17 PM

## 2022-12-19 NOTE — Patient Instructions (Signed)
Visit Information  Thank you for taking time to visit with me today. Please don't hesitate to contact me if I can be of assistance to you.   Following are the goals we discussed today:   Goals Addressed             This Visit's Progress    Obtain Supportive Resources-Symptom Management   On track    Activities and task to complete in order to accomplish goals.   Keep all upcoming appointments discussed today Continue with compliance of taking medication prescribed by Doctor Implement healthy coping skills discussed to assist with management of symptoms Continue working with [facility] care team to assist with goals identified Follow up with SNF to discuss continued discharge planning for otpt care         Our next appointment is by telephone on 09/03   Please call the care guide team at [phone] if you need to cancel or reschedule your appointment.   If you are experiencing a Mental Health or Behavioral Health Crisis or need someone to talk to, please call the Suicide and Crisis Lifeline: 988 call 911   Patient verbalizes understanding of instructions and care plan provided today and agrees to view in MyChart. Active MyChart status and patient understanding of how to access instructions and care plan via MyChart confirmed with patient.     Jenel Lucks, MSW, LCSW [facility] Care Management [place]  [facility] [email or web address] Phone [phone] 6:18 PM

## 2022-12-20 DIAGNOSIS — G894 Chronic pain syndrome: Secondary | ICD-10-CM | POA: Diagnosis not present

## 2022-12-20 DIAGNOSIS — R451 Restlessness and agitation: Secondary | ICD-10-CM | POA: Diagnosis not present

## 2022-12-20 DIAGNOSIS — N390 Urinary tract infection, site not specified: Secondary | ICD-10-CM | POA: Diagnosis not present

## 2022-12-20 DIAGNOSIS — R531 Weakness: Secondary | ICD-10-CM | POA: Diagnosis not present

## 2022-12-21 DIAGNOSIS — G894 Chronic pain syndrome: Secondary | ICD-10-CM | POA: Diagnosis not present

## 2022-12-21 DIAGNOSIS — M79675 Pain in left toe(s): Secondary | ICD-10-CM | POA: Diagnosis not present

## 2022-12-21 DIAGNOSIS — R451 Restlessness and agitation: Secondary | ICD-10-CM | POA: Diagnosis not present

## 2022-12-22 DIAGNOSIS — G894 Chronic pain syndrome: Secondary | ICD-10-CM | POA: Diagnosis not present

## 2022-12-22 DIAGNOSIS — R451 Restlessness and agitation: Secondary | ICD-10-CM | POA: Diagnosis not present

## 2022-12-22 DIAGNOSIS — F0150 Vascular dementia without behavioral disturbance: Secondary | ICD-10-CM | POA: Diagnosis not present

## 2022-12-23 ENCOUNTER — Telehealth: Payer: Self-pay | Admitting: *Deleted

## 2022-12-23 ENCOUNTER — Encounter: Payer: Self-pay | Admitting: Licensed Clinical Social Worker

## 2022-12-23 DIAGNOSIS — N390 Urinary tract infection, site not specified: Secondary | ICD-10-CM | POA: Diagnosis not present

## 2022-12-23 DIAGNOSIS — F0150 Vascular dementia without behavioral disturbance: Secondary | ICD-10-CM | POA: Diagnosis not present

## 2022-12-23 DIAGNOSIS — R531 Weakness: Secondary | ICD-10-CM | POA: Diagnosis not present

## 2022-12-23 NOTE — Progress Notes (Signed)
  Care Coordination Note  12/23/2022 Name: HENRIETTE LANDINI MRN: [id number] DOB: 1946/03/02  Lauren Garrett is a 77 y.o. year old female who is a primary care patient of Tisovec, Adelfa Koh, MD and is actively engaged with the care management team. I reached out to Abe People by phone today to assist with re-scheduling a follow up visit with the Licensed Clinical Social Worker  Follow up plan: Unsuccessful telephone outreach attempt made. A HIPAA compliant phone message was left for the patient providing contact information and requesting a return call.   [facility]  Care Coordination Care Guide  Direct Dial: [phone]

## 2022-12-23 NOTE — Progress Notes (Signed)
  Care Coordination Note  12/23/2022 Name: Lauren Garrett MRN: [id number] DOB: 01-27-1946  Lauren Garrett is a 77 y.o. year old female who is a primary care patient of Tisovec, Adelfa Koh, MD and is actively engaged with the care management team. I reached out to Abe People by phone today to assist with re-scheduling a follow up visit with the RN Case Manager  Follow up plan: Telephone appointment with care management team member scheduled for:12/25/22  [facility] Coordination Care Guide  Direct Dial: [phone]

## 2022-12-24 ENCOUNTER — Encounter: Payer: Self-pay | Admitting: Licensed Clinical Social Worker

## 2022-12-24 DIAGNOSIS — G4700 Insomnia, unspecified: Secondary | ICD-10-CM | POA: Diagnosis not present

## 2022-12-24 DIAGNOSIS — F0150 Vascular dementia without behavioral disturbance: Secondary | ICD-10-CM | POA: Diagnosis not present

## 2022-12-24 DIAGNOSIS — M545 Low back pain, unspecified: Secondary | ICD-10-CM | POA: Diagnosis not present

## 2022-12-24 DIAGNOSIS — R531 Weakness: Secondary | ICD-10-CM | POA: Diagnosis not present

## 2022-12-24 DIAGNOSIS — M549 Dorsalgia, unspecified: Secondary | ICD-10-CM | POA: Diagnosis not present

## 2022-12-24 DIAGNOSIS — M25551 Pain in right hip: Secondary | ICD-10-CM | POA: Diagnosis not present

## 2022-12-25 ENCOUNTER — Ambulatory Visit: Payer: Self-pay | Admitting: Licensed Clinical Social Worker

## 2022-12-25 ENCOUNTER — Ambulatory Visit: Payer: Self-pay

## 2022-12-25 DIAGNOSIS — R531 Weakness: Secondary | ICD-10-CM | POA: Diagnosis not present

## 2022-12-25 DIAGNOSIS — M549 Dorsalgia, unspecified: Secondary | ICD-10-CM | POA: Diagnosis not present

## 2022-12-25 DIAGNOSIS — F0150 Vascular dementia without behavioral disturbance: Secondary | ICD-10-CM | POA: Diagnosis not present

## 2022-12-25 NOTE — Patient Outreach (Signed)
  Care Coordination   Multidisciplinary Case Review Note    12/24/2022 Name: Lauren Garrett MRN: [id number] DOB: 30-Oct-1945  Lauren Garrett is a 77 y.o. year old female who sees Tisovec, Adelfa Koh, MD for primary care.  The  multidisciplinary care team met today to review patient care needs and barriers.    Care Coordination Interventions: Multidisciplinary case discussion to review patient ongoing care coordination needs  Patient to be engaged ongoing by RN Care Manager Angel Little and LCSW Jenel Lucks to address disease management and social support needs  SDOH assessments and interventions completed:  No     Care Coordination Interventions Activated:  Yes   Care Coordination Interventions:  Yes, provided  Interventions Today    Flowsheet Row Most Recent Value  Chronic Disease   Chronic disease during today's visit Hypertension (HTN), Other  [Vascular Dementia]  General Interventions   General Interventions Discussed/Reviewed Communication with  Communication with RN, Social Work       Follow up plan: Follow up call scheduled for 12/25/22    Multidisciplinary Team Attendees:   Delsa Sale, RNCM [address] BSW Jenel Lucks, [place]  Scribe for Multidisciplinary Case Review:   Jenel Lucks, MSW, LCSW [facility] Care Management [facility]  [facility] [email or web address] Phone [phone] 11:25 PM

## 2022-12-25 NOTE — Patient Outreach (Signed)
  Care Coordination   Follow Up Visit Note   12/25/2022 Name: MINDI BARTOLOMUCCI MRN: [id number] DOB: [birth year]  Alverna B Kane is a 77 y.o. year old female who sees Tisovec, Adelfa Koh, MD for primary care. I spoke with  Jacqualine B Lauricella's family, including, spouse, adult son, daughter in law, and adult daughter by phone today.  What matters to the patients health and wellness today?  Supportive Resources    Goals Addressed             This Visit's Progress    Obtain Supportive Resources-Symptom Management   On track    Activities and task to complete in order to accomplish goals.   Keep all upcoming appointments discussed today Continue with compliance of taking medication prescribed by Doctor Implement healthy coping skills discussed to assist with management of symptoms Continue working with [facility] care team to assist with goals identified Follow up with SNF to discuss continued discharge planning for otpt care Review supportive resources (When a Loved One needs LTC, ALF, Nursing Home Facilities for [facility])         SDOH assessments and interventions completed:  No     Care Coordination Interventions:  Yes, provided  Interventions Today    Flowsheet Row Most Recent Value  Chronic Disease   Chronic disease during today's visit Other  [Vascular Dementia, Anxiety]  General Interventions   General Interventions Discussed/Reviewed General Interventions Reviewed, [facility], Doctor Visits, Communication with  Doctor Visits Discussed/Reviewed Doctor Visits Reviewed  [SNF Discharge Planning]  Communication with RN  [facility] participated in family meeting, as well]  Level of Care Skilled Nursing Facility, Assisted Living  [Long-Term Memory Care Facilities]  Education Interventions   Provided Verbal Education On Insurance Plans, Other  [Applying for LTC Medicaid for LTC Placement]  Mental Health Interventions   Mental Health Discussed/Reviewed Mental  Health Reviewed, Coping Strategies       Follow up plan: Follow up call scheduled for 2-4 weeks    Encounter Outcome:  Patient Visit Completed   Jenel Lucks, MSW, LCSW [facility] Care Management [facility]  [facility] [email or web address] Phone [phone] 11:44 PM

## 2022-12-25 NOTE — Patient Instructions (Signed)
Visit Information  Thank you for taking time to visit with me today. Please don't hesitate to contact me if I can be of assistance to you.   Following are the goals we discussed today:   Goals Addressed             This Visit's Progress    Obtain Supportive Resources-Symptom Management   On track    Activities and task to complete in order to accomplish goals.   Keep all upcoming appointments discussed today Continue with compliance of taking medication prescribed by Doctor Implement healthy coping skills discussed to assist with management of symptoms Continue working with [facility] care team to assist with goals identified Follow up with SNF to discuss continued discharge planning for otpt care Review supportive resources (When a Loved One needs LTC, ALF, Nursing Home Facilities for [facility])         Please call the care guide team at [phone] if you need to cancel or reschedule your appointment.   If you are experiencing a Mental Health or Behavioral Health Crisis or need someone to talk to, please call the Suicide and Crisis Lifeline: 988 call 911   Patient verbalizes understanding of instructions and care plan provided today and agrees to view in MyChart. Active MyChart status and patient understanding of how to access instructions and care plan via MyChart confirmed with patient.     The family will f/up with LCSW to schedule f/up appt  Jenel Lucks, MSW, LCSW [facility] Care Management [facility]  [facility] [email or web address] Phone [phone] 11:45 PM

## 2022-12-26 DIAGNOSIS — R531 Weakness: Secondary | ICD-10-CM | POA: Diagnosis not present

## 2022-12-26 DIAGNOSIS — R451 Restlessness and agitation: Secondary | ICD-10-CM | POA: Diagnosis not present

## 2022-12-26 DIAGNOSIS — F0150 Vascular dementia without behavioral disturbance: Secondary | ICD-10-CM | POA: Diagnosis not present

## 2022-12-26 DIAGNOSIS — N390 Urinary tract infection, site not specified: Secondary | ICD-10-CM | POA: Diagnosis not present

## 2022-12-26 NOTE — Patient Outreach (Signed)
  Care Coordination   Follow Up Visit Note   12/25/2022 Name: Lauren Garrett MRN: [id number] DOB: 11/16/1945  Lauren Garrett is a 77 y.o. year old female who sees Tisovec, Adelfa Koh, MD for primary care. I spoke with daughter Lauren Garrett, son Lauren Garrett, husband Lauren Garrett by phone today.  What matters to the patients/caregivers health and wellness today?  Patient's family would like resources and educated related to level of care needs for their mother once she is discharged home from acute rehab facility.     Goals Addressed             This Visit's Progress    RN Care Coordination Activities: further follow up needed       Care Coordination Interventions: Evaluation of current treatment plan related to dementia and patient's adherence to plan as established by provider Completed joint call with LCSW Jenel Lucks, with patient's family including daughter Lauren Garrett, son Lauren Garrett and husband Lauren Garrett  Determined patient remains to be at [facility] with an expected discharge date of 12/29/22 Determined the family would like resources to help meet patient's needs once discharged to her home, her husband will be her primary caregiver Determined currently patient is bed bound and needing assistance with transfers, she is incontinent of bowel and bladder, she continues to have some disorientation secondary to dementia Reviewed with SW and family, caregiver options including private pay and or placement into a facility, SW will email Lauren Garrett the information requested pertaining to these services Educated the family about [facility] to take over primary care needs in the home, they will consider and let this RN know if help is needed with placing the referral  Discussed with family, they plan to ask the discharging physician at the facility to place orders for home health upon discharge Educated family about the advanced stages of dementia and what to expect, educated family about the importance of home  safety including medication safety and supervising all medication administration, keeping all medications out of patient's reach, placing locks on interior doors so patient cannot wander Determined patient is currently not scheduled to f/u with her Neurologist, Dr. Pearlean Brownie again until October, daughter Lauren Garrett may try for an earlier appointment, she may consider asking for a new Neurologist  Discussed plans with the family for ongoing care coordination follow up and provided daughter Lauren Garrett with the direct contact information for this nurse care coordinator as well as the [facility] care guide team  Active listening / Reflection utilized  Emotional Support Provided     Interventions Today    Flowsheet Row Most Recent Value  Chronic Disease   Chronic disease during today's visit Other  General Interventions   General Interventions Discussed/Reviewed General Interventions Discussed, General Interventions Reviewed, Doctor Visits  Doctor Visits Discussed/Reviewed Doctor Visits Reviewed, Doctor Visits Discussed, Specialist, PCP  Education Interventions   Education Provided Provided Education  Mental Health Interventions   Mental Health Discussed/Reviewed Mental Health Discussed, Mental Health Reviewed, Anxiety, Coping Strategies, Refer to Social Work for resources  Refer to Social Work for resources regarding Assisted Living/Skilled Holiday representative, Mental Health  Safety Interventions   Safety Discussed/Reviewed Safety Discussed, Safety Reviewed, Home Safety          SDOH assessments and interventions completed:  No     Care Coordination Interventions:  Yes, provided   Follow up plan: Follow up call scheduled for 12/31/22 @12  PM    Encounter Outcome:  Patient Visit Completed

## 2022-12-26 NOTE — Patient Instructions (Signed)
Visit Information  Thank you for taking time to visit with me today. Please don't hesitate to contact me if I can be of assistance to you.   Following are the goals we discussed today:   Goals Addressed             This Visit's Progress    RN Care Coordination Activities: further follow up needed       Care Coordination Interventions: Evaluation of current treatment plan related to dementia and patient's adherence to plan as established by provider Completed joint call with LCSW Jenel Lucks, with patient's family including daughter [name], son Pauletta Browns and husband Remi Deter  Determined patient remains to be at [facility] with an expected discharge date of 12/29/22 Determined the family would like resources to help meet patient's needs once discharged to her home, her husband will be her primary caregiver Determined currently patient is bed bound and needing assistance with transfers, she is incontinent of bowel and bladder, she continues to have some disorientation secondary to dementia Reviewed with SW and family, caregiver options including private pay and or placement into a facility, SW will email Ayanna the information requested pertaining to these services Educated the family about [facility] to take over primary care needs in the home, they will consider and let this RN know if help is needed with placing the referral  Discussed with family, they plan to ask the discharging physician at the facility to place orders for home health upon discharge Educated family about the advanced stages of dementia and what to expect, educated family about the importance of home safety including medication safety and supervising all medication administration, keeping all medications out of patient's reach, placing locks on interior doors so patient cannot wander Determined patient is currently not scheduled to f/u with her Neurologist, Dr. Pearlean Brownie again until October, daughter [name] may try for an  earlier appointment, she may consider asking for a new Neurologist  Discussed plans with the family for ongoing care coordination follow up and provided daughter [name] with the direct contact information for this nurse care coordinator as well as the [facility] care guide team  Active listening / Reflection utilized  Emotional Support Provided         Our next appointment is by telephone on 12/31/22 at 12:00 PM  Please call the care guide team at [phone] if you need to cancel or reschedule your appointment.   If you are experiencing a Mental Health or Behavioral Health Crisis or need someone to talk to, please call 1-800-273-TALK (toll free, 24 hour hotline)  Patient verbalizes understanding of instructions and care plan provided today and agrees to view in MyChart. Active MyChart status and patient understanding of how to access instructions and care plan via MyChart confirmed with patient.     Delsa Sale, RN, BSN, CCM Care Management Coordinator [facility] Care Management  Direct Phone: [phone]

## 2022-12-27 DIAGNOSIS — N390 Urinary tract infection, site not specified: Secondary | ICD-10-CM | POA: Diagnosis not present

## 2022-12-28 DIAGNOSIS — R531 Weakness: Secondary | ICD-10-CM | POA: Diagnosis not present

## 2022-12-28 DIAGNOSIS — Z87440 Personal history of urinary (tract) infections: Secondary | ICD-10-CM | POA: Diagnosis not present

## 2022-12-28 DIAGNOSIS — F0150 Vascular dementia without behavioral disturbance: Secondary | ICD-10-CM | POA: Diagnosis not present

## 2022-12-28 DIAGNOSIS — G894 Chronic pain syndrome: Secondary | ICD-10-CM | POA: Diagnosis not present

## 2022-12-29 DIAGNOSIS — Z87440 Personal history of urinary (tract) infections: Secondary | ICD-10-CM | POA: Diagnosis not present

## 2022-12-29 DIAGNOSIS — R531 Weakness: Secondary | ICD-10-CM | POA: Diagnosis not present

## 2022-12-29 DIAGNOSIS — F0150 Vascular dementia without behavioral disturbance: Secondary | ICD-10-CM | POA: Diagnosis not present

## 2022-12-31 ENCOUNTER — Ambulatory Visit: Payer: Self-pay

## 2022-12-31 DIAGNOSIS — Z7189 Other specified counseling: Secondary | ICD-10-CM | POA: Diagnosis not present

## 2022-12-31 DIAGNOSIS — F0150 Vascular dementia without behavioral disturbance: Secondary | ICD-10-CM | POA: Diagnosis not present

## 2022-12-31 DIAGNOSIS — R531 Weakness: Secondary | ICD-10-CM | POA: Diagnosis not present

## 2023-01-01 ENCOUNTER — Telehealth: Payer: Self-pay | Admitting: Neurology

## 2023-01-01 ENCOUNTER — Telehealth: Payer: Self-pay | Admitting: Licensed Clinical Social Worker

## 2023-01-01 NOTE — Patient Instructions (Signed)
Visit Information  Thank you for taking time to visit with me today. Please don't hesitate to contact me if I can be of assistance to you.   Following are the goals we discussed today:   Goals Addressed             This Visit's Progress    RN Care Coordination Activities: further follow up needed   On track    Care Coordination Interventions: Evaluation of current treatment plan related to dementia and patient's adherence to plan as established by provider Completed joint call with LCSW Jenel Lucks, with patient's family including daughter [name], son Pauletta Browns and daughter in law Joni Reining Determined patient remains to be at [facility] with a new expected discharge date of 01/02/23 Discussed the family will not be able to afford 24/7 private pay care for the patient upon discharging home  Determined the family is asking for additional resources in order to prepare for level of care concerns Educated the family about [facility], further education provided by LCSW Ulla Gallo Reviewed and discussed patient's Neurology follow up with Dr. Pearlean Brownie was moved up to 01/06/23 @3 :30 PM, patient's son will accompany her to this appointment  Provided guidance to the family on how to approach the Neurologist regarding the families concerns and patient's worsening dementia         Our next appointment is by telephone on 01/14/23 at 1:00 PM  Please call the care guide team at [phone] if you need to cancel or reschedule your appointment.   If you are experiencing a Mental Health or Behavioral Health Crisis or need someone to talk to, please call 1-800-273-TALK (toll free, 24 hour hotline)  Patient verbalizes understanding of instructions and care plan provided today and agrees to view in MyChart. Active MyChart status and patient understanding of how to access instructions and care plan via MyChart confirmed with patient.     Delsa Sale RN BSN CCM [place]  [facility], [facility] Nurse Care Coordinator  Direct Dial: [phone] Website: [email or web address]

## 2023-01-01 NOTE — Telephone Encounter (Signed)
VM box full, sent mychart msg informing pt of r/s needed for 9/17 appt- MD out.

## 2023-01-01 NOTE — Patient Outreach (Signed)
  Care Coordination   Follow Up Visit Note   12/31/2022 Name: Lauren Garrett MRN: [id number] DOB: Jun 15, 1945  Lauren Garrett is a 77 y.o. year old female who sees Tisovec, Adelfa Koh, MD for primary care. I spoke with  Lauren Garrett's adult children and daughter in law by phone today.  What matters to the patients health and wellness today?  Supportive resources and Levels of Care    Goals Addressed             This Visit's Progress    Obtain Supportive Resources-Symptom Management   On track    Activities and task to complete in order to accomplish goals.   Keep all upcoming appointments discussed today Continue with compliance of taking medication prescribed by Doctor Implement healthy coping skills discussed to assist with management of symptoms Continue working with [facility] care team to assist with goals identified Follow up with SNF to discuss continued discharge planning for otpt care Review supportive resources (When a Loved One needs LTC, ALF, Nursing Home Facilities for [facility]) Contact [facility] to determine services and eligibility         SDOH assessments and interventions completed:  No     Care Coordination Interventions:  Yes, provided  Interventions Today    Flowsheet Row Most Recent Value  Chronic Disease   Chronic disease during today's visit Hypertension (HTN), 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 Supportive resources discussed]       Follow up plan:  Lauren Garrett will f/up with RNCM and family within 1-2 weeks     Encounter Outcome:  Patient Visit Completed   Lauren Garrett, Lauren Garrett, Lauren Garrett [facility] Care Management [facility]  [facility] [email or web address] Phone [phone] 6:39 PM

## 2023-01-01 NOTE — Patient Instructions (Signed)
Visit Information  Thank you for taking time to visit with me today. Please don't hesitate to contact me if I can be of assistance to you.   Following are the goals we discussed today:   Goals Addressed             This Visit's Progress    Obtain Supportive Resources-Symptom Management   On track    Activities and task to complete in order to accomplish goals.   Keep all upcoming appointments discussed today Continue with compliance of taking medication prescribed by Doctor Implement healthy coping skills discussed to assist with management of symptoms Continue working with [facility] care team to assist with goals identified Follow up with SNF to discuss continued discharge planning for otpt care Review supportive resources (When a Loved One needs LTC, ALF, Nursing Home Facilities for [facility]) Contact [facility] to determine services and eligibility         Please call the care guide team at [phone] if you need to cancel or reschedule your appointment.   If you are experiencing a Mental Health or Behavioral Health Crisis or need someone to talk to, please call the Suicide and Crisis Lifeline: 988 call 911   Patient verbalizes understanding of instructions and care plan provided today and agrees to view in MyChart. Active MyChart status and patient understanding of how to access instructions and care plan via MyChart confirmed with patient.     Jenel Lucks, MSW, LCSW [facility] Care Management   [facility] [email or web address] Phone [phone] 7:57 PM

## 2023-01-01 NOTE — Patient Instructions (Signed)
Visit Information  Thank you for taking time to visit with me today. Please don't hesitate to contact me if I can be of assistance to you.   Following are the goals we discussed today:   Goals Addressed             This Visit's Progress    Obtain Supportive Resources-Symptom Management   On track    Activities and task to complete in order to accomplish goals.   Keep all upcoming appointments discussed today Continue with compliance of taking medication prescribed by Doctor Implement healthy coping skills discussed to assist with management of symptoms Continue working with [facility] care team to assist with goals identified Follow up with SNF to discuss continued discharge planning for otpt care Review supportive resources (When a Loved One needs LTC, ALF, Nursing Home Facilities for [facility]) Contact [facility] to determine services and eligibility         Please call the care guide team at [phone] if you need to cancel or reschedule your appointment.   If you are experiencing a Mental Health or Behavioral Health Crisis or need someone to talk to, please call the Suicide and Crisis Lifeline: 988 call 911   Patient verbalizes understanding of instructions and care plan provided today and agrees to view in MyChart. Active MyChart status and patient understanding of how to access instructions and care plan via MyChart confirmed with patient.     Jenel Lucks, MSW, LCSW [facility] Care Management [place]  [facility] [email or web address] Phone [phone] 6:40 PM

## 2023-01-01 NOTE — Patient Outreach (Signed)
  Care Coordination   Follow Up Visit Note   01/01/2023 Name: HASNA TAFF MRN: [id number] DOB: 1945-05-19  Tamantha B Mcgaha is a 77 y.o. year old female who sees Tisovec, Adelfa Koh, MD for primary care. I emailed with  Joshua B Buley's daughter today.  What matters to the patients health and wellness today?  Supportive information on local Neurologists    Goals Addressed             This Visit's Progress    Obtain Supportive Resources-Symptom Management   On track    Activities and task to complete in order to accomplish goals.   Keep all upcoming appointments discussed today Continue with compliance of taking medication prescribed by Doctor Implement healthy coping skills discussed to assist with management of symptoms Continue working with [facility] care team to assist with goals identified Follow up with SNF to discuss continued discharge planning for otpt care Review supportive resources (When a Loved One needs LTC, ALF, Nursing Home Facilities for [facility]) Contact [facility] to determine services and eligibility         SDOH assessments and interventions completed:  No     Care Coordination Interventions:  Yes, provided  Interventions Today    Flowsheet Row Most Recent Value  Chronic Disease   Chronic disease during today's visit Hypertension (HTN), 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

## 2023-01-01 NOTE — Patient Outreach (Addendum)
  Care Coordination   Follow Up Visit Note   12/31/2022 Name: Lauren Garrett MRN: [id number] DOB: [birth year]  Lauren Garrett is a 77 y.o. year old female who sees Tisovec, Adelfa Koh, MD for primary care. I spoke with daughter [name], son Pauletta Browns, and daughter in Data processing manager by phone today.  What matters to the patients/caregiver health and wellness today?  The family would like to establish a plan to care for the patient once she is discharged home from acute rehab.     Goals Addressed             This Visit's Progress    RN Care Coordination Activities: further follow up needed   On track    Care Coordination Interventions: Evaluation of current treatment plan related to dementia and patient's adherence to plan as established by provider Completed joint call with LCSW Jenel Lucks, with patient's family including daughter [name], son Pauletta Browns and daughter in law Joni Reining Determined patient remains to be at [facility] with a new expected discharge date of 01/02/23 Discussed the family will not be able to afford 24/7 private pay care for the patient upon discharging home  Determined the family is asking for additional resources in order to prepare for level of care concerns Educated the family about [facility], further education provided by LCSW Ulla Gallo Reviewed and discussed patient's Neurology follow up with Dr. Pearlean Brownie was moved up to 01/06/23 @3 :30 PM, patient's son will accompany her to this appointment  Provided guidance to the family on how to approach the Neurologist regarding the families concerns and patient's worsening dementia     Interventions Today    Flowsheet Row Most Recent Value  Chronic Disease   Chronic disease during today's visit Other  [impaired physical mobility]  General Interventions   General Interventions Discussed/Reviewed General Interventions Discussed, General Interventions Reviewed, Level of Care, Communication with  Doctor Visits  Discussed/Reviewed Doctor Visits Discussed, Doctor Visits Reviewed, Specialist  Communication with Social Work  [LCSW Jasmine Lewis]  Level of Care Skilled Nursing Facility  Exercise Interventions   Exercise Discussed/Reviewed Physical Activity  Physical Activity Discussed/Reviewed Physical Activity Reviewed, Physical Activity Discussed  Education Interventions   Education Provided Provided Education  Mental Health Interventions   Mental Health Discussed/Reviewed Mental Health Discussed, Mental Health Reviewed  Safety Interventions   Safety Discussed/Reviewed Safety Discussed, Safety Reviewed, Fall Risk, Home Safety          SDOH assessments and interventions completed:  No     Care Coordination Interventions:  Yes, provided   Follow up plan: Follow up call scheduled for 01/14/23 @1 :00 PM    Encounter Outcome:  Patient Visit Completed

## 2023-01-03 ENCOUNTER — Telehealth: Payer: Self-pay | Admitting: Licensed Clinical Social Worker

## 2023-01-03 DIAGNOSIS — R531 Weakness: Secondary | ICD-10-CM | POA: Diagnosis not present

## 2023-01-03 DIAGNOSIS — F0150 Vascular dementia without behavioral disturbance: Secondary | ICD-10-CM | POA: Diagnosis not present

## 2023-01-03 DIAGNOSIS — R32 Unspecified urinary incontinence: Secondary | ICD-10-CM | POA: Diagnosis not present

## 2023-01-03 DIAGNOSIS — E46 Unspecified protein-calorie malnutrition: Secondary | ICD-10-CM | POA: Diagnosis not present

## 2023-01-03 DIAGNOSIS — Z7189 Other specified counseling: Secondary | ICD-10-CM | POA: Diagnosis not present

## 2023-01-03 NOTE — Patient Instructions (Signed)
Visit Information  Thank you for taking time to visit with me today. Please don't hesitate to contact me if I can be of assistance to you.   Following are the goals we discussed today:   Goals Addressed             This Visit's Progress    Obtain Supportive Resources-Symptom Management   On track    Activities and task to complete in order to accomplish goals.   Keep all upcoming appointments discussed today Continue with compliance of taking medication prescribed by Doctor Implement healthy coping skills discussed to assist with management of symptoms Continue working with [facility] care team to assist with goals identified Follow up with SNF to discuss continued discharge planning for otpt care Review supportive resources (When a Loved One needs LTC, ALF, Nursing Home Facilities for [facility]) Review resources on local neurologists Contact [facility] to determine services and eligibility         Please call the care guide team at [phone] if you need to cancel or reschedule your appointment.   If you are experiencing a Mental Health or Behavioral Health Crisis or need someone to talk to, please call the Suicide and Crisis Lifeline: 988 call 911   Patient verbalizes understanding of instructions and care plan provided today and agrees to view in MyChart. Active MyChart status and patient understanding of how to access instructions and care plan via MyChart confirmed with patient.     Jenel Lucks, MSW, LCSW [facility] Care Management [place]  [facility] [email or web address] Phone [phone] 4:15 PM

## 2023-01-03 NOTE — Patient Outreach (Signed)
Care Coordination   Follow Up Visit Note   01/02/2023 Name: Lauren Garrett MRN: [id number] DOB: 1946/03/27  Lauren Garrett is a 77 y.o. year old female who sees Tisovec, Adelfa Koh, MD for primary care. I  engaged with pt's daughter via email  What matters to the patients health and wellness today?  Supportive Resources    Goals Addressed             This Visit's Progress    Obtain Supportive Resources-Symptom Management   On track    Activities and task to complete in order to accomplish goals.   Keep all upcoming appointments discussed today Continue with compliance of taking medication prescribed by Doctor Implement healthy coping skills discussed to assist with management of symptoms Continue working with [facility] care team to assist with goals identified Follow up with SNF to discuss continued discharge planning for otpt care Review supportive resources (When a Loved One needs LTC, ALF, Nursing Home Facilities for [facility]) Review resources on local neurologists Contact [facility] to determine services and eligibility         SDOH assessments and interventions completed:  No     Care Coordination Interventions:  Yes, provided  Interventions Today    Flowsheet Row Most Recent Value  Chronic Disease   Chronic disease during today's visit Hypertension (HTN), 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 Family will be looking at local options regarding providers]  Doctor Visits Discussed/Reviewed Doctor Visits Reviewed       Follow up plan: Follow up call scheduled for 1-2 weeks    Encounter Outcome:  Patient Visit Completed   Jenel Lucks, MSW, LCSW [facility] Care Management [facility]  [facility] [email or web address] Phone [phone] 4:15 PM

## 2023-01-06 ENCOUNTER — Telehealth: Payer: Self-pay | Admitting: Neurology

## 2023-01-06 ENCOUNTER — Ambulatory Visit: Payer: Medicare PPO | Admitting: Neurology

## 2023-01-06 NOTE — Telephone Encounter (Signed)
Pt's daughter Wayna Chalet pt has been hospitalized in a facility and she in advanced stages of dementia stated by the hosptialist. Requesting earlier or transferred to another physician. She needs appt as soon as possible due dementia is moving rapidly. Would like a call back to discuss.

## 2023-01-07 ENCOUNTER — Encounter: Payer: Self-pay | Admitting: Licensed Clinical Social Worker

## 2023-01-07 DIAGNOSIS — M13862 Other specified arthritis, left knee: Secondary | ICD-10-CM | POA: Diagnosis not present

## 2023-01-07 DIAGNOSIS — R262 Difficulty in walking, not elsewhere classified: Secondary | ICD-10-CM | POA: Diagnosis not present

## 2023-01-07 DIAGNOSIS — M25561 Pain in right knee: Secondary | ICD-10-CM | POA: Diagnosis not present

## 2023-01-07 DIAGNOSIS — M25562 Pain in left knee: Secondary | ICD-10-CM | POA: Diagnosis not present

## 2023-01-07 DIAGNOSIS — R5381 Other malaise: Secondary | ICD-10-CM | POA: Diagnosis not present

## 2023-01-07 DIAGNOSIS — M5459 Other low back pain: Secondary | ICD-10-CM | POA: Diagnosis not present

## 2023-01-08 NOTE — Patient Outreach (Signed)
Care Coordination   Multidisciplinary Case Review Note    01/07/2023 Name: Lauren Garrett MRN: [id number] DOB: [birth year]  Lauren Garrett is a 77 y.o. year old female who sees Tisovec, Adelfa Koh, MD for primary care.  The  multidisciplinary care team met today to review patient care needs and barriers.     Goals Addressed             This Visit's Progress    Obtain Supportive Resources-Symptom Management   On track    Activities and task to complete in order to accomplish goals.   Keep all upcoming appointments discussed today Continue with compliance of taking medication prescribed by Doctor Implement healthy coping skills discussed to assist with management of symptoms Continue working with [facility] care team to assist with goals identified Follow up with SNF to discuss continued discharge planning for otpt care Review supportive resources (When a Loved One needs LTC, ALF, Nursing Home Facilities for [facility]) Review resources on local neurologists Contact [facility] to determine services and eligibility         SDOH assessments and interventions completed:  No     Care Coordination Interventions Activated:  Yes   Care Coordination Interventions:  Yes, provided   Follow up plan:  RNCM and LCSW will f/up with patient and family     Multidisciplinary Team Attendees:   Delsa Sale, RNCM Jenel Lucks, LCSW Roshanda Florance, [facility] RNCM  Scribe for Multidisciplinary Case Review:   Jenel Lucks, MSW, LCSW [facility] Care Management [facility]  [facility] [email or web address] Phone [phone] 3:39 PM

## 2023-01-09 DIAGNOSIS — M25562 Pain in left knee: Secondary | ICD-10-CM | POA: Diagnosis not present

## 2023-01-09 DIAGNOSIS — F0150 Vascular dementia without behavioral disturbance: Secondary | ICD-10-CM | POA: Diagnosis not present

## 2023-01-09 DIAGNOSIS — M25561 Pain in right knee: Secondary | ICD-10-CM | POA: Diagnosis not present

## 2023-01-09 DIAGNOSIS — R531 Weakness: Secondary | ICD-10-CM | POA: Diagnosis not present

## 2023-01-14 ENCOUNTER — Ambulatory Visit: Payer: Self-pay

## 2023-01-14 DIAGNOSIS — R262 Difficulty in walking, not elsewhere classified: Secondary | ICD-10-CM | POA: Diagnosis not present

## 2023-01-14 DIAGNOSIS — M13862 Other specified arthritis, left knee: Secondary | ICD-10-CM | POA: Diagnosis not present

## 2023-01-14 DIAGNOSIS — M25562 Pain in left knee: Secondary | ICD-10-CM | POA: Diagnosis not present

## 2023-01-14 DIAGNOSIS — M5459 Other low back pain: Secondary | ICD-10-CM | POA: Diagnosis not present

## 2023-01-14 DIAGNOSIS — R5381 Other malaise: Secondary | ICD-10-CM | POA: Diagnosis not present

## 2023-01-14 DIAGNOSIS — F0150 Vascular dementia without behavioral disturbance: Secondary | ICD-10-CM | POA: Diagnosis not present

## 2023-01-14 DIAGNOSIS — R451 Restlessness and agitation: Secondary | ICD-10-CM | POA: Diagnosis not present

## 2023-01-14 DIAGNOSIS — M25561 Pain in right knee: Secondary | ICD-10-CM | POA: Diagnosis not present

## 2023-01-14 DIAGNOSIS — R410 Disorientation, unspecified: Secondary | ICD-10-CM | POA: Diagnosis not present

## 2023-01-15 NOTE — Patient Instructions (Addendum)
Visit Information  Thank you for taking time to visit with me today. Please don't hesitate to contact me if I can be of assistance to you.   Following are the goals we discussed today:   Goals Addressed             This Visit's Progress    RN Care Coordination Activities: further follow up needed   On track    Care Coordination Interventions: Evaluation of current treatment plan related to dementia and patient's adherence to plan as established by provider Completed joint call with son Charolotte Capuchin and daughter in law Joni Reining Determined patient remains to be at [facility] with a potential discharge date of 01/16/23 Determined patient is making slight improvements in regards to transfers and ambulation to short distances with assistance Discussed patient's short term memory has worsened, a urine specimen will be obtained to rule out UTI Determined [facility] will complete a face to face visit with patient prior to her discharge home, they will also completed a home visit to assess for home safety concerns and prepare for patient's return to her home  Discussed the family will consider [facility] when ready if patient progress's more towards wellness after returning home  Provided emotional support active listening to families concerns and validated their feelings  Educated son regarding Hospice care and discussed the family will schedule an earlier f/u with the Neurology NP as Dorette Grate by Dr. Pearlean Brownie Provided guidance to the family on how to approach the Neurologist regarding the families concerns and patient's worsening dementia           Our next appointment is by telephone on 01/28/23 at 11:30 AM  Please call the care guide team at [phone] if you need to cancel or reschedule your appointment.   If you are experiencing a Mental Health or Behavioral Health Crisis or need someone to talk to, please call 1-800-273-TALK (toll free, 24 hour hotline)  Patient verbalizes understanding  of instructions and care plan provided today and agrees to view in MyChart. Active MyChart status and patient understanding of how to access instructions and care plan via MyChart confirmed with patient.     Delsa Sale RN BSN CCM [place]  [facility], [facility] Nurse Care Coordinator  Direct Dial: [phone] Website: [email or web address]

## 2023-01-15 NOTE — Patient Outreach (Signed)
Care Coordination   Follow Up Visit Note   01/14/2023 Name: Lauren Garrett MRN: [id number] DOB: 13-Jun-1945  Lauren Garrett is a 77 y.o. year old female who sees Tisovec, Adelfa Koh, MD for primary care. I spoke with son Lauren Garrett by phone today.  What matters to the patients health and wellness today?  Patient not engaged during this visit     Goals Addressed             This Visit's Progress    RN Care Coordination Activities: further follow up needed   On track    Care Coordination Interventions: Evaluation of current treatment plan related to dementia and patient's adherence to plan as established by provider Completed joint call with son Lauren Garrett and daughter in law Lauren Garrett Determined patient remains to be at [facility] with a potential discharge date of 01/16/23 Determined patient is making slight improvements in regards to transfers and ambulation to short distances with assistance Discussed patient's short term memory has worsened, a urine specimen will be obtained to rule out UTI Determined [facility] will complete a face to face visit with patient prior to her discharge home, they will also completed a home visit to assess for home safety concerns and prepare for patient's return to her home  Discussed the family will consider [facility]ACE when ready if patient progress's more towards wellness after returning home  Provided emotional support active listening to families concerns and validated their feelings  Educated son regarding Hospice care and discussed the family will schedule an earlier f/u with the Neurology NP as okayed by Dr. 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]  General Interventions   General  Interventions Discussed/Reviewed General Interventions Discussed, General Interventions Reviewed, Doctor Visits, Communication with  Doctor Visits Discussed/Reviewed Doctor Visits Discussed, Doctor Visits Reviewed, Specialist  Communication with Social Work  Jenel Lucks LCSW]  Level of Care Skilled Nursing Facility  Education Interventions   Education Provided Provided Education  Provided Verbal Education On Mental Health/Coping with Illness  Mental Health Interventions   Mental Health Discussed/Reviewed Mental Health Discussed, Mental Health Reviewed, Other  [caregiver stress]  Refer to Social Work for resources regarding Adult Daycare, Assisted Living/Skilled Nursing Facility  Safety Interventions   Safety Discussed/Reviewed Safety Discussed, Safety Reviewed, Fall Risk, Home Safety          SDOH assessments and interventions completed:  No     Care Coordination Interventions:  Yes, provided   Follow up plan: Follow up call scheduled for 01/28/23 @11 :30 AM    Encounter Outcome:  Patient Visit Completed

## 2023-01-16 DIAGNOSIS — R531 Weakness: Secondary | ICD-10-CM | POA: Diagnosis not present

## 2023-01-16 DIAGNOSIS — F0150 Vascular dementia without behavioral disturbance: Secondary | ICD-10-CM | POA: Diagnosis not present

## 2023-01-16 DIAGNOSIS — R410 Disorientation, unspecified: Secondary | ICD-10-CM | POA: Diagnosis not present

## 2023-01-19 DIAGNOSIS — M170 Bilateral primary osteoarthritis of knee: Secondary | ICD-10-CM | POA: Diagnosis not present
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
## 2023-01-20 ENCOUNTER — Encounter: Payer: Self-pay | Admitting: Adult Health

## 2023-01-20 ENCOUNTER — Telehealth: Payer: Self-pay | Admitting: Adult Health

## 2023-01-20 ENCOUNTER — Ambulatory Visit: Payer: Medicare PPO | Admitting: Adult Health

## 2023-01-20 VITALS — BP 167/72 | HR 63 | Ht 67.0 in | Wt 138.0 lb

## 2023-01-20 DIAGNOSIS — Z7189 Other specified counseling: Secondary | ICD-10-CM | POA: Diagnosis not present

## 2023-01-20 DIAGNOSIS — R4189 Other symptoms and signs involving cognitive functions and awareness: Secondary | ICD-10-CM

## 2023-01-20 DIAGNOSIS — F01518 Vascular dementia, unspecified severity, with other behavioral disturbance: Secondary | ICD-10-CM | POA: Diagnosis not present

## 2023-01-20 DIAGNOSIS — R531 Weakness: Secondary | ICD-10-CM | POA: Diagnosis not present

## 2023-01-20 DIAGNOSIS — F02818 Dementia in other diseases classified elsewhere, unspecified severity, with other behavioral disturbance: Secondary | ICD-10-CM

## 2023-01-20 DIAGNOSIS — R413 Other amnesia: Secondary | ICD-10-CM | POA: Diagnosis not present

## 2023-01-20 DIAGNOSIS — F0150 Vascular dementia without behavioral disturbance: Secondary | ICD-10-CM | POA: Diagnosis not present

## 2023-01-20 DIAGNOSIS — G309 Alzheimer's disease, unspecified: Secondary | ICD-10-CM | POA: Diagnosis not present

## 2023-01-20 DIAGNOSIS — R32 Unspecified urinary incontinence: Secondary | ICD-10-CM | POA: Diagnosis not present

## 2023-01-20 MED ORDER — DONEPEZIL HCL 10 MG PO TABS
10.0000 mg | ORAL_TABLET | Freq: Every day | ORAL | 5 refills | Status: DC
Start: 2023-01-20 — End: 2023-04-08

## 2023-01-20 NOTE — Telephone Encounter (Signed)
Please see OV note from 01/20/2023 for further information, patient with long standing hx of memory loss complaints which have been stable throughout several years but recently significantly declined since August, was evaluated in ED. Family requesting change of provider from Dr. Pearlean Brownie due to limited scheduling availability. Are you okay with this?

## 2023-01-20 NOTE — Patient Instructions (Addendum)
Your Plan:  Increase Aricept to 10mg  nightly  Consider trying Namenda (memantine) which can help slow cognitive decline and possibly help with behaviors associated with dementia (paranoia, delusions) - please let me know if you would like to start this medication  Continue Depakote for now - can consider increasing dose in the future to further help with behaviors   Start home health therapies to work on balance and overall strengthening    You will be scheduled to complete an EEG - will notify you via MyChart regarding the results      Will keep you updated regarding change in providers and will try to get you scheduled within a 3 month time frame       Thank you for coming to see [place] at [facility]. I hope we have been able to provide you high quality care today.  You may receive a patient satisfaction survey over the next few weeks. We would appreciate your feedback and comments so that we may continue to improve ourselves and the health of our patients.     Alzheimer's Disease Alzheimer's disease is a disease that affects the way your brain works. It affects memory and causes changes in how you think, talk, and act. The disease gets worse over time. Alzheimer's disease is a form of dementia. What are the causes? Alzheimer's disease happens when a protein called beta-amyloid forms deposits in the brain. It's not known what causes these to form. The disease may also be caused by a harmful change in a gene that's passed down, or inherited, from one or both parents. Not everyone who gets the changed gene from their parents will get the disease. What increases the risk? Being older than 75 years of age. Being female. Having any of these conditions: High blood pressure. Diabetes. Heart or blood vessel disease. Smoking. Being very overweight. Having a brain injury or a stroke in the past. Having a family history of dementia. What are the signs or  symptoms?  Symptoms may happen in three stages, which often overlap. Early stage In this stage, you can still do things on your own. You may still be able to drive, work, and be social. Symptoms in this stage include: Forgetting small things, like a name, words, or what you did recently. Having a hard time with: Paying attention or learning new things. Talking with people. Doing your usual tasks. Solving problems or doing math. Following instructions. Feeling worried or nervous. Not wanting to be around people. Losing interest in doing things. Moderate stage In this stage, you'll start to need care. Symptoms include: Trouble saying what you're thinking. Memory loss that affects daily life. This can include forgetting: Things that happened recently. If you've taken medicines or eaten. Places you know. You may get lost while walking or driving. To pay bills. To bathe or use the bathroom. Being confused about where you are or what time it is. Trouble judging distance. Changes in how you feel or act. You may be moody, angry, upset, scared, worried, or suspicious. Not thinking clearly or making good choices. You may have delusions or false beliefs. Hallucinations. This means you see, hear, taste, smell, or feel things that aren't real. Severe stage In the severe stage, you'll need help with your personal care and daily activities. Symptoms include: Memory loss getting worse. Personality changes. Not knowing where you are. Physical problems, like trouble walking, sitting, or swallowing. More trouble talking with others. Not being able to control when you pee (  urinate) or poop. More changes in how you act. How is this diagnosed? You may need to see a nervous system specialist, called a neurologist, or a health care provider who focuses on the care of older adults. Alzheimer's disease may be diagnosed based on: Your symptoms and medical history. Your provider will talk with you and  your family, friends, and caregivers about your history and symptoms. A physical exam. Tests. These may include: Lab tests, such as tests on your blood or pee. Imaging tests. You may have a CT scan, a PET scan, or an MRI. A lumbar puncture. For this test, a sample of the fluid around the brain and spinal cord is taken and tested. Tests to check your thinking and memory. Genetic testing. This may be done if you get the disease before age 12 or if other family members have had the disease. How is this treated? At this time, there's no cure for Alzheimer's disease. The goals of treatment are to: Manage symptoms that affect behavior. Make sure you're safe at home. Help manage daily life for you and your caregivers. Treatment may include: Medicines. You may get medicines that can: Slow down how fast the disease gets worse. Help with memory and behavior. Cognitive therapy. This gives you support, training to help with thinking skills, and memory aids. Counseling or spiritual guidance. These can help you deal with the many feelings you may have, such as fear, anger, or feeling alone. Caregivers. These are people who help you with your daily tasks. Family support groups. These allow your family members to learn about the disease, get emotional support, and find out about resources to help take care of you. Follow these instructions at home:  Medicines Take over-the-counter and prescription medicines only as told by your provider. Use a pill organizer or pill reminder to help you keep track of your medicines. Avoid taking medicines that can affect thinking, such as medicines for pain or sleep. Lifestyle Make healthy choices: Be active as told by your provider. Regular exercise may help with symptoms. Do not smoke, vape, or use products with nicotine or tobacco in them. Do not drink alcohol. Eat a healthy diet. When you feel a lot of stress, do something that helps you relax. Try things like  yoga or deep breathing. Spend time with people. Drink enough fluid to keep your pee pale yellow. Make sure you sleep well. These tips can help: Try not to take long naps during the day. Take short naps of 30 minutes or less if needed. Keep your bedroom dark and cool. Do not exercise during the few hours before you go to bed. Avoid caffeine products in the afternoon and evening. General instructions Work with your provider to decide: What things you need help with. What your safety needs are. If you were given a bracelet that tracks where you are and shows that you're a person with memory loss, make sure you wear it at all times. Talk with your provider about whether it's safe for you to drive. Work with your family to make big legal or health decisions. This may include things like advance directives, medical power of attorney, or a living will. Where to find more information There are two ways to contact the Alzheimer's Association: Call the 24-hour helpline at 908-563-2884. Visit [employer] Contact a health care provider if: Your medicine causes you to have nausea, vomiting, or trouble with eating. You have mood or behavior changes that are getting worse, such as feeling depressed,  worried, or nervous. You have hallucinations. You or your family members are worried about your safety. You're hard to wake up. Your memory suddenly gets worse. Get help right away if: You feel like you may hurt yourself or others. You have thoughts about taking your own life. Take one of these steps: Go to your nearest emergency room. Call 911. Call the National Suicide Prevention Lifeline at 805-545-3120 or 988. Text the Crisis Text Line at 548-082-6670. This information is not intended to replace advice given to you by your health care provider. Make sure you discuss any questions you have with your health care provider. Document Revised: 07/08/2022 Document Reviewed: 07/08/2022 Elsevier Patient  Education  2024 [employer].

## 2023-01-21 DIAGNOSIS — M170 Bilateral primary osteoarthritis of knee: Secondary | ICD-10-CM | POA: Diagnosis not present

## 2023-01-21 DIAGNOSIS — M25462 Effusion, left knee: Secondary | ICD-10-CM | POA: Diagnosis not present

## 2023-01-21 DIAGNOSIS — D631 Anemia in chronic kidney disease: Secondary | ICD-10-CM | POA: Diagnosis not present

## 2023-01-21 DIAGNOSIS — G8929 Other chronic pain: Secondary | ICD-10-CM | POA: Diagnosis not present

## 2023-01-21 DIAGNOSIS — N183 Chronic kidney disease, stage 3 unspecified: Secondary | ICD-10-CM | POA: Diagnosis not present

## 2023-01-21 DIAGNOSIS — F0150 Vascular dementia without behavioral disturbance: Secondary | ICD-10-CM | POA: Diagnosis not present

## 2023-01-21 DIAGNOSIS — E1122 Type 2 diabetes mellitus with diabetic chronic kidney disease: Secondary | ICD-10-CM | POA: Diagnosis not present

## 2023-01-21 DIAGNOSIS — I679 Cerebrovascular disease, unspecified: Secondary | ICD-10-CM | POA: Diagnosis not present

## 2023-01-21 DIAGNOSIS — I129 Hypertensive chronic kidney disease with stage 1 through stage 4 chronic kidney disease, or unspecified chronic kidney disease: Secondary | ICD-10-CM | POA: Diagnosis not present

## 2023-01-21 NOTE — Telephone Encounter (Signed)
Would any of you be willing to take over care for patient?

## 2023-01-26 ENCOUNTER — Encounter: Payer: Self-pay | Admitting: Adult Health

## 2023-01-26 DIAGNOSIS — M25462 Effusion, left knee: Secondary | ICD-10-CM | POA: Diagnosis not present

## 2023-01-26 DIAGNOSIS — I679 Cerebrovascular disease, unspecified: Secondary | ICD-10-CM | POA: Diagnosis not present

## 2023-01-26 DIAGNOSIS — D631 Anemia in chronic kidney disease: Secondary | ICD-10-CM | POA: Diagnosis not present

## 2023-01-26 DIAGNOSIS — G8929 Other chronic pain: Secondary | ICD-10-CM | POA: Diagnosis not present

## 2023-01-26 DIAGNOSIS — M170 Bilateral primary osteoarthritis of knee: Secondary | ICD-10-CM | POA: Diagnosis not present

## 2023-01-26 DIAGNOSIS — I129 Hypertensive chronic kidney disease with stage 1 through stage 4 chronic kidney disease, or unspecified chronic kidney disease: Secondary | ICD-10-CM | POA: Diagnosis not present

## 2023-01-26 DIAGNOSIS — F0150 Vascular dementia without behavioral disturbance: Secondary | ICD-10-CM | POA: Diagnosis not present

## 2023-01-26 DIAGNOSIS — E1122 Type 2 diabetes mellitus with diabetic chronic kidney disease: Secondary | ICD-10-CM | POA: Diagnosis not present

## 2023-01-26 DIAGNOSIS — N183 Chronic kidney disease, stage 3 unspecified: Secondary | ICD-10-CM | POA: Diagnosis not present

## 2023-01-27 DIAGNOSIS — E1122 Type 2 diabetes mellitus with diabetic chronic kidney disease: Secondary | ICD-10-CM | POA: Diagnosis not present

## 2023-01-27 DIAGNOSIS — I129 Hypertensive chronic kidney disease with stage 1 through stage 4 chronic kidney disease, or unspecified chronic kidney disease: Secondary | ICD-10-CM | POA: Diagnosis not present

## 2023-01-27 DIAGNOSIS — D631 Anemia in chronic kidney disease: Secondary | ICD-10-CM | POA: Diagnosis not present

## 2023-01-27 DIAGNOSIS — F0150 Vascular dementia without behavioral disturbance: Secondary | ICD-10-CM | POA: Diagnosis not present

## 2023-01-27 DIAGNOSIS — I679 Cerebrovascular disease, unspecified: Secondary | ICD-10-CM | POA: Diagnosis not present

## 2023-01-27 DIAGNOSIS — N183 Chronic kidney disease, stage 3 unspecified: Secondary | ICD-10-CM | POA: Diagnosis not present

## 2023-01-27 DIAGNOSIS — G8929 Other chronic pain: Secondary | ICD-10-CM | POA: Diagnosis not present

## 2023-01-27 DIAGNOSIS — M170 Bilateral primary osteoarthritis of knee: Secondary | ICD-10-CM | POA: Diagnosis not present

## 2023-01-27 DIAGNOSIS — M25462 Effusion, left knee: Secondary | ICD-10-CM | POA: Diagnosis not present

## 2023-01-28 ENCOUNTER — Ambulatory Visit: Payer: Self-pay

## 2023-01-28 DIAGNOSIS — F0150 Vascular dementia without behavioral disturbance: Secondary | ICD-10-CM | POA: Diagnosis not present

## 2023-01-28 DIAGNOSIS — E1122 Type 2 diabetes mellitus with diabetic chronic kidney disease: Secondary | ICD-10-CM | POA: Diagnosis not present

## 2023-01-28 DIAGNOSIS — N183 Chronic kidney disease, stage 3 unspecified: Secondary | ICD-10-CM | POA: Diagnosis not present

## 2023-01-28 DIAGNOSIS — M170 Bilateral primary osteoarthritis of knee: Secondary | ICD-10-CM | POA: Diagnosis not present

## 2023-01-28 DIAGNOSIS — D631 Anemia in chronic kidney disease: Secondary | ICD-10-CM | POA: Diagnosis not present

## 2023-01-28 DIAGNOSIS — G8929 Other chronic pain: Secondary | ICD-10-CM | POA: Diagnosis not present

## 2023-01-28 DIAGNOSIS — M25462 Effusion, left knee: Secondary | ICD-10-CM | POA: Diagnosis not present

## 2023-01-28 DIAGNOSIS — I129 Hypertensive chronic kidney disease with stage 1 through stage 4 chronic kidney disease, or unspecified chronic kidney disease: Secondary | ICD-10-CM | POA: Diagnosis not present

## 2023-01-28 DIAGNOSIS — I679 Cerebrovascular disease, unspecified: Secondary | ICD-10-CM | POA: Diagnosis not present

## 2023-01-29 NOTE — Patient Outreach (Signed)
Care Coordination   Follow Up Visit Note   01/28/2023 Name: Lauren Garrett MRN: [id number] DOB: 22-Jun-1945  Lauren Garrett is a 77 y.o. year old female who sees Tisovec, Adelfa Koh, MD for primary care. I spoke with daughter Lauren Garrett, husband Lauren Garrett and son Lauren Garrett by phone today.  What matters to the patients health and wellness today?  Patient's family would like to provide optimal care for the patient and keep her safe at home.     Goals Addressed             This Visit's Progress    RN Care Coordination Activities: further follow up needed   On track    Care Coordination Interventions: Evaluation of current treatment plan related to dementia and patient's adherence to plan as established by provider Completed joint call with daughter Lauren Garrett, husband and son Lauren Garrett  Determined patient has been discharged home from [facility]  Reviewed and discussed level of care concerns, patient has a 24/7 privately paid nursing aide as well as having her family to share the responsibility of providing care at this time  Reviewed and discussed recent follow up with [facility], Ihor Austin NP for evaluation of patient's dementia Determined patient received a diagnosis of having 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 Pearlean Brownie, Neurologist is scheduled for 6 months Re-educated family about [facility] Determined family may consider having patient f/u with Orthopedics for evaluation of her knee pain  Assessed for DME and home safety concerns, husband would like to obtain an additional manual  W/C for the first floor of the home, they recently obtained a manual W/C while patient was in rehab covered by the health plan Educated family about W/C coverage every 5 years per the health plan  Sent in basket message to Jenel Lucks LCSW with a patient status update and request to provide resources that may help family obtain an additional W/C Discussed plans with patient for ongoing care coordination follow up and provided patient with direct contact information for nurse care coordinator    Interventions Today    Flowsheet Row Most Recent Value  Chronic Disease   Chronic disease during today's visit Other  [Mixed Vascular and Alzheimer's Dementia]  General Interventions   General Interventions Discussed/Reviewed General Interventions Discussed, General Interventions Reviewed, Doctor Visits, Communication with  Doctor Visits Discussed/Reviewed Doctor Visits Discussed, Doctor Visits Reviewed, Specialist, PCP  Communication with Social Work, PCP/Specialists  [Jasmine Lewis LCSW,  Ihor Austin NP]  Exercise Interventions   Exercise Discussed/Reviewed Physical Activity  Physical Activity Discussed/Reviewed Physical Activity Discussed, Physical Activity Reviewed, Home Exercise Program (HEP)  Education Interventions   Education Provided Provided Education  Provided Verbal Education On Mental Health/Coping with Illness, Medication, Exercise, When to see the doctor, Nutrition  Mental Health Interventions   Mental Health Discussed/Reviewed Mental Health Discussed, Mental Health Reviewed, Refer to Social Work for resources, Hydrologist and Alzheimer's Dementia with change in behavior]  Nutrition Interventions   Nutrition Discussed/Reviewed Nutrition Discussed, Nutrition Reviewed, Fluid intake  Pharmacy Interventions   Pharmacy Dicussed/Reviewed Pharmacy Topics Discussed, Pharmacy Topics Reviewed, Medications and their functions  Safety Interventions   Safety Discussed/Reviewed Home  Safety, Fall Risk, Safety Reviewed, Safety Discussed  Home Safety Refer for community resources, Assistive Devices  [place],  [facility]]          SDOH assessments and interventions completed:  No     Care Coordination Interventions:  Yes, provided   Follow up plan: Follow up call scheduled for 03/31/23 @2 :00 PM    Encounter Outcome:  Patient Visit Completed

## 2023-01-29 NOTE — Patient Instructions (Signed)
Visit Information  Thank you for taking time to visit with me today. Please don't hesitate to contact me if I can be of assistance to you.   Following are the goals we discussed today:   Goals Addressed             This Visit's Progress    RN Care Coordination Activities: further follow up needed   On track    Care Coordination Interventions: Evaluation of current treatment plan related to dementia and patient's adherence to plan as established by provider Completed joint call with daughter [name], husband and son Charolotte Capuchin  Determined patient has been discharged home from [facility]  Reviewed and discussed level of care concerns, patient has a 24/7 privately paid nursing aide as well as having her family to share the responsibility of providing care at this time  Reviewed and discussed recent follow up with [facility], Ihor Austin NP for evaluation of patient's dementia Determined patient received a diagnosis of having 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 Pearlean Brownie, Neurologist is scheduled for 6 months Re-educated family about [facility] Determined family may consider having patient f/u with Orthopedics for evaluation of her knee pain  Assessed for DME and home safety concerns, husband would like to obtain an additional manual W/C for the first floor of the home, they recently obtained a manual W/C while patient was in rehab covered by the health plan Educated family about W/C coverage every 5 years per the health plan  Sent in basket message to Jenel Lucks LCSW with a patient  status update and request to provide resources that may help family obtain an additional W/C Discussed plans with patient for ongoing care coordination follow up and provided patient with direct contact information for nurse care coordinator        Our next appointment is by telephone on 03/31/23 at 2:00 PM  Please call the care guide team at [phone] if you need to cancel or reschedule your appointment.   If you are experiencing a Mental Health or Behavioral Health Crisis or need someone to talk to, please call 1-800-273-TALK (toll free, 24 hour hotline)  Patient verbalizes understanding of instructions and care plan provided today and agrees to view in MyChart. Active MyChart status and patient understanding of how to access instructions and care plan via MyChart confirmed with patient.     Delsa Sale RN BSN CCM [place]  [facility], [facility] Nurse Care Coordinator  Direct Dial: [phone] Website: [email or web address]

## 2023-01-30 DIAGNOSIS — N390 Urinary tract infection, site not specified: Secondary | ICD-10-CM | POA: Diagnosis not present

## 2023-02-02 DIAGNOSIS — I129 Hypertensive chronic kidney disease with stage 1 through stage 4 chronic kidney disease, or unspecified chronic kidney disease: Secondary | ICD-10-CM | POA: Diagnosis not present

## 2023-02-02 DIAGNOSIS — G8929 Other chronic pain: Secondary | ICD-10-CM | POA: Diagnosis not present

## 2023-02-02 DIAGNOSIS — E1122 Type 2 diabetes mellitus with diabetic chronic kidney disease: Secondary | ICD-10-CM | POA: Diagnosis not present

## 2023-02-02 DIAGNOSIS — M170 Bilateral primary osteoarthritis of knee: Secondary | ICD-10-CM | POA: Diagnosis not present

## 2023-02-02 DIAGNOSIS — N183 Chronic kidney disease, stage 3 unspecified: Secondary | ICD-10-CM | POA: Diagnosis not present

## 2023-02-02 DIAGNOSIS — F0150 Vascular dementia without behavioral disturbance: Secondary | ICD-10-CM | POA: Diagnosis not present

## 2023-02-02 DIAGNOSIS — M25462 Effusion, left knee: Secondary | ICD-10-CM | POA: Diagnosis not present

## 2023-02-02 DIAGNOSIS — I679 Cerebrovascular disease, unspecified: Secondary | ICD-10-CM | POA: Diagnosis not present

## 2023-02-02 DIAGNOSIS — D631 Anemia in chronic kidney disease: Secondary | ICD-10-CM | POA: Diagnosis not present

## 2023-02-03 ENCOUNTER — Telehealth: Payer: Self-pay | Admitting: Adult Health

## 2023-02-03 DIAGNOSIS — F0150 Vascular dementia without behavioral disturbance: Secondary | ICD-10-CM | POA: Diagnosis not present

## 2023-02-03 DIAGNOSIS — G8929 Other chronic pain: Secondary | ICD-10-CM | POA: Diagnosis not present

## 2023-02-03 DIAGNOSIS — I679 Cerebrovascular disease, unspecified: Secondary | ICD-10-CM | POA: Diagnosis not present

## 2023-02-03 DIAGNOSIS — D631 Anemia in chronic kidney disease: Secondary | ICD-10-CM | POA: Diagnosis not present

## 2023-02-03 DIAGNOSIS — M170 Bilateral primary osteoarthritis of knee: Secondary | ICD-10-CM | POA: Diagnosis not present

## 2023-02-03 DIAGNOSIS — I129 Hypertensive chronic kidney disease with stage 1 through stage 4 chronic kidney disease, or unspecified chronic kidney disease: Secondary | ICD-10-CM | POA: Diagnosis not present

## 2023-02-03 DIAGNOSIS — E1122 Type 2 diabetes mellitus with diabetic chronic kidney disease: Secondary | ICD-10-CM | POA: Diagnosis not present

## 2023-02-03 DIAGNOSIS — M25462 Effusion, left knee: Secondary | ICD-10-CM | POA: Diagnosis not present

## 2023-02-03 DIAGNOSIS — N183 Chronic kidney disease, stage 3 unspecified: Secondary | ICD-10-CM | POA: Diagnosis not present

## 2023-02-03 MED ORDER — MEMANTINE HCL 28 X 5 MG & 21 X 10 MG PO TABS: ORAL_TABLET | ORAL | 0 refills | Status: DC

## 2023-02-03 NOTE — Addendum Note (Signed)
Addended by: Judi Cong on: 02/03/2023 04:03 PM   Modules accepted: Orders

## 2023-02-03 NOTE — Telephone Encounter (Signed)
Received staff message from Delsa Sale, RN [place] nurse care coordinator who reported recent discussion with patient's family who were concerned regarding worsening behavior of verbal abuse with outbursts and anger. They requested either initiating memantine or consider increasing Depakote dosage or adding SSRI. At this point, I would recommend initiating memantine with gradual titration to 10mg  BID. Will send in the titration dose pack. Family can call towards the end of the titration pack and if tolerating well, will send in 10mg  tablets for 10mg  BID dosing. Patient can continue to follow with PCP for ongoing management and further recommendations or be scheduled with Dr. Pearlean Brownie for follow up.

## 2023-02-03 NOTE — Telephone Encounter (Signed)
Called the daughter and reviewed with her that the nurse reached out to Ihor Austin, NP on the pts continued outburst and behavior changes. 10/1 depakote 125 mg was started for the patient to help with agitation. Advised that Shanda Bumps would like to start the memantine. Order was placed and sent to the pharmacy and was able to give the instructions on how to take the medication. She verbalized understanding.

## 2023-02-05 ENCOUNTER — Telehealth: Payer: Self-pay | Admitting: Licensed Clinical Social Worker

## 2023-02-05 DIAGNOSIS — D631 Anemia in chronic kidney disease: Secondary | ICD-10-CM | POA: Diagnosis not present

## 2023-02-05 DIAGNOSIS — M25462 Effusion, left knee: Secondary | ICD-10-CM | POA: Diagnosis not present

## 2023-02-05 DIAGNOSIS — E1122 Type 2 diabetes mellitus with diabetic chronic kidney disease: Secondary | ICD-10-CM | POA: Diagnosis not present

## 2023-02-05 DIAGNOSIS — F0150 Vascular dementia without behavioral disturbance: Secondary | ICD-10-CM | POA: Diagnosis not present

## 2023-02-05 DIAGNOSIS — I129 Hypertensive chronic kidney disease with stage 1 through stage 4 chronic kidney disease, or unspecified chronic kidney disease: Secondary | ICD-10-CM | POA: Diagnosis not present

## 2023-02-05 DIAGNOSIS — G8929 Other chronic pain: Secondary | ICD-10-CM | POA: Diagnosis not present

## 2023-02-05 DIAGNOSIS — N183 Chronic kidney disease, stage 3 unspecified: Secondary | ICD-10-CM | POA: Diagnosis not present

## 2023-02-05 DIAGNOSIS — M170 Bilateral primary osteoarthritis of knee: Secondary | ICD-10-CM | POA: Diagnosis not present

## 2023-02-05 DIAGNOSIS — I679 Cerebrovascular disease, unspecified: Secondary | ICD-10-CM | POA: Diagnosis not present

## 2023-02-05 NOTE — Patient Outreach (Signed)
Care Coordination   Follow Up Visit Note   02/05/2023 Name: Lauren Garrett MRN: [id number] DOB: 01/29/1946  Lauren Garrett is a 77 y.o. year old female who sees Tisovec, Adelfa Koh, MD for primary care. I reached out to  Zafirah B Verba's daughter by email today.  What matters to the patients health and wellness today?  DME Needs     SDOH assessments and interventions completed:  No     Care Coordination Interventions:  Yes, provided  Interventions Today    Flowsheet Row Most Recent Value  Chronic Disease   Chronic disease during today's visit Other  [Mixed vascular and Alzheimer's dementia]  General Interventions   General Interventions Discussed/Reviewed Durable Medical Equipment (DME), Communication with  [LCSW reached out to pt's daughter inquiring about wheelchair needs.]  Communication with RN  [facility] and LCSW collaborated about patient and care needs]       Follow up plan:  LCSW will await for a return call/email    Encounter Outcome:  Patient Visit Completed   Jenel Lucks, MSW, LCSW [facility] Care Management [facility]  [facility] [email or web address] Phone [phone] 6:44 PM

## 2023-02-06 DIAGNOSIS — I129 Hypertensive chronic kidney disease with stage 1 through stage 4 chronic kidney disease, or unspecified chronic kidney disease: Secondary | ICD-10-CM | POA: Diagnosis not present

## 2023-02-06 DIAGNOSIS — M170 Bilateral primary osteoarthritis of knee: Secondary | ICD-10-CM | POA: Diagnosis not present

## 2023-02-06 DIAGNOSIS — I679 Cerebrovascular disease, unspecified: Secondary | ICD-10-CM | POA: Diagnosis not present

## 2023-02-06 DIAGNOSIS — N183 Chronic kidney disease, stage 3 unspecified: Secondary | ICD-10-CM | POA: Diagnosis not present

## 2023-02-06 DIAGNOSIS — D631 Anemia in chronic kidney disease: Secondary | ICD-10-CM | POA: Diagnosis not present

## 2023-02-06 DIAGNOSIS — G8929 Other chronic pain: Secondary | ICD-10-CM | POA: Diagnosis not present

## 2023-02-06 DIAGNOSIS — M25462 Effusion, left knee: Secondary | ICD-10-CM | POA: Diagnosis not present

## 2023-02-06 DIAGNOSIS — F0150 Vascular dementia without behavioral disturbance: Secondary | ICD-10-CM | POA: Diagnosis not present

## 2023-02-06 DIAGNOSIS — E1122 Type 2 diabetes mellitus with diabetic chronic kidney disease: Secondary | ICD-10-CM | POA: Diagnosis not present

## 2023-02-09 DIAGNOSIS — F01B0 Vascular dementia, moderate, without behavioral disturbance, psychotic disturbance, mood disturbance, and anxiety: Secondary | ICD-10-CM | POA: Diagnosis not present

## 2023-02-09 DIAGNOSIS — E1129 Type 2 diabetes mellitus with other diabetic kidney complication: Secondary | ICD-10-CM | POA: Diagnosis not present

## 2023-02-09 DIAGNOSIS — Z1389 Encounter for screening for other disorder: Secondary | ICD-10-CM | POA: Diagnosis not present

## 2023-02-09 DIAGNOSIS — I129 Hypertensive chronic kidney disease with stage 1 through stage 4 chronic kidney disease, or unspecified chronic kidney disease: Secondary | ICD-10-CM | POA: Diagnosis not present

## 2023-02-09 DIAGNOSIS — E876 Hypokalemia: Secondary | ICD-10-CM | POA: Diagnosis not present

## 2023-02-09 DIAGNOSIS — M4800 Spinal stenosis, site unspecified: Secondary | ICD-10-CM | POA: Diagnosis not present

## 2023-02-09 DIAGNOSIS — Z0000 Encounter for general adult medical examination without abnormal findings: Secondary | ICD-10-CM | POA: Diagnosis not present

## 2023-02-09 DIAGNOSIS — E039 Hypothyroidism, unspecified: Secondary | ICD-10-CM | POA: Diagnosis not present

## 2023-02-09 DIAGNOSIS — R531 Weakness: Secondary | ICD-10-CM | POA: Diagnosis not present

## 2023-02-09 DIAGNOSIS — D631 Anemia in chronic kidney disease: Secondary | ICD-10-CM | POA: Diagnosis not present

## 2023-02-09 DIAGNOSIS — N1831 Chronic kidney disease, stage 3a: Secondary | ICD-10-CM | POA: Diagnosis not present

## 2023-02-10 DIAGNOSIS — I129 Hypertensive chronic kidney disease with stage 1 through stage 4 chronic kidney disease, or unspecified chronic kidney disease: Secondary | ICD-10-CM | POA: Diagnosis not present

## 2023-02-10 DIAGNOSIS — E1122 Type 2 diabetes mellitus with diabetic chronic kidney disease: Secondary | ICD-10-CM | POA: Diagnosis not present

## 2023-02-10 DIAGNOSIS — F0150 Vascular dementia without behavioral disturbance: Secondary | ICD-10-CM | POA: Diagnosis not present

## 2023-02-10 DIAGNOSIS — D631 Anemia in chronic kidney disease: Secondary | ICD-10-CM | POA: Diagnosis not present

## 2023-02-10 DIAGNOSIS — G8929 Other chronic pain: Secondary | ICD-10-CM | POA: Diagnosis not present

## 2023-02-10 DIAGNOSIS — N183 Chronic kidney disease, stage 3 unspecified: Secondary | ICD-10-CM | POA: Diagnosis not present

## 2023-02-10 DIAGNOSIS — I679 Cerebrovascular disease, unspecified: Secondary | ICD-10-CM | POA: Diagnosis not present

## 2023-02-10 DIAGNOSIS — M25462 Effusion, left knee: Secondary | ICD-10-CM | POA: Diagnosis not present

## 2023-02-10 DIAGNOSIS — M170 Bilateral primary osteoarthritis of knee: Secondary | ICD-10-CM | POA: Diagnosis not present

## 2023-02-12 DIAGNOSIS — G8929 Other chronic pain: Secondary | ICD-10-CM | POA: Diagnosis not present

## 2023-02-12 DIAGNOSIS — F0150 Vascular dementia without behavioral disturbance: Secondary | ICD-10-CM | POA: Diagnosis not present

## 2023-02-12 DIAGNOSIS — M25462 Effusion, left knee: Secondary | ICD-10-CM | POA: Diagnosis not present

## 2023-02-12 DIAGNOSIS — I679 Cerebrovascular disease, unspecified: Secondary | ICD-10-CM | POA: Diagnosis not present

## 2023-02-12 DIAGNOSIS — D631 Anemia in chronic kidney disease: Secondary | ICD-10-CM | POA: Diagnosis not present

## 2023-02-12 DIAGNOSIS — N183 Chronic kidney disease, stage 3 unspecified: Secondary | ICD-10-CM | POA: Diagnosis not present

## 2023-02-12 DIAGNOSIS — E1122 Type 2 diabetes mellitus with diabetic chronic kidney disease: Secondary | ICD-10-CM | POA: Diagnosis not present

## 2023-02-12 DIAGNOSIS — I129 Hypertensive chronic kidney disease with stage 1 through stage 4 chronic kidney disease, or unspecified chronic kidney disease: Secondary | ICD-10-CM | POA: Diagnosis not present

## 2023-02-12 DIAGNOSIS — M170 Bilateral primary osteoarthritis of knee: Secondary | ICD-10-CM | POA: Diagnosis not present

## 2023-02-13 DIAGNOSIS — N183 Chronic kidney disease, stage 3 unspecified: Secondary | ICD-10-CM | POA: Diagnosis not present

## 2023-02-13 DIAGNOSIS — M170 Bilateral primary osteoarthritis of knee: Secondary | ICD-10-CM | POA: Diagnosis not present

## 2023-02-13 DIAGNOSIS — I129 Hypertensive chronic kidney disease with stage 1 through stage 4 chronic kidney disease, or unspecified chronic kidney disease: Secondary | ICD-10-CM | POA: Diagnosis not present

## 2023-02-13 DIAGNOSIS — E1122 Type 2 diabetes mellitus with diabetic chronic kidney disease: Secondary | ICD-10-CM | POA: Diagnosis not present

## 2023-02-13 DIAGNOSIS — F0150 Vascular dementia without behavioral disturbance: Secondary | ICD-10-CM | POA: Diagnosis not present

## 2023-02-13 DIAGNOSIS — I679 Cerebrovascular disease, unspecified: Secondary | ICD-10-CM | POA: Diagnosis not present

## 2023-02-13 DIAGNOSIS — M25462 Effusion, left knee: Secondary | ICD-10-CM | POA: Diagnosis not present

## 2023-02-13 DIAGNOSIS — D631 Anemia in chronic kidney disease: Secondary | ICD-10-CM | POA: Diagnosis not present

## 2023-02-13 DIAGNOSIS — G8929 Other chronic pain: Secondary | ICD-10-CM | POA: Diagnosis not present

## 2023-02-17 ENCOUNTER — Ambulatory Visit: Payer: Medicare PPO | Admitting: Neurology

## 2023-02-17 DIAGNOSIS — E1122 Type 2 diabetes mellitus with diabetic chronic kidney disease: Secondary | ICD-10-CM | POA: Diagnosis not present

## 2023-02-17 DIAGNOSIS — I129 Hypertensive chronic kidney disease with stage 1 through stage 4 chronic kidney disease, or unspecified chronic kidney disease: Secondary | ICD-10-CM | POA: Diagnosis not present

## 2023-02-17 DIAGNOSIS — M170 Bilateral primary osteoarthritis of knee: Secondary | ICD-10-CM | POA: Diagnosis not present

## 2023-02-17 DIAGNOSIS — G8929 Other chronic pain: Secondary | ICD-10-CM | POA: Diagnosis not present

## 2023-02-17 DIAGNOSIS — I679 Cerebrovascular disease, unspecified: Secondary | ICD-10-CM | POA: Diagnosis not present

## 2023-02-17 DIAGNOSIS — D631 Anemia in chronic kidney disease: Secondary | ICD-10-CM | POA: Diagnosis not present

## 2023-02-17 DIAGNOSIS — F0150 Vascular dementia without behavioral disturbance: Secondary | ICD-10-CM | POA: Diagnosis not present

## 2023-02-17 DIAGNOSIS — M25462 Effusion, left knee: Secondary | ICD-10-CM | POA: Diagnosis not present

## 2023-02-17 DIAGNOSIS — N183 Chronic kidney disease, stage 3 unspecified: Secondary | ICD-10-CM | POA: Diagnosis not present

## 2023-02-18 ENCOUNTER — Ambulatory Visit (INDEPENDENT_AMBULATORY_CARE_PROVIDER_SITE_OTHER): Payer: Medicare PPO | Admitting: Neurology

## 2023-02-18 ENCOUNTER — Telehealth: Payer: Self-pay | Admitting: Adult Health

## 2023-02-18 ENCOUNTER — Encounter: Payer: Self-pay | Admitting: *Deleted

## 2023-02-18 DIAGNOSIS — M170 Bilateral primary osteoarthritis of knee: Secondary | ICD-10-CM | POA: Diagnosis not present

## 2023-02-18 DIAGNOSIS — G309 Alzheimer's disease, unspecified: Secondary | ICD-10-CM

## 2023-02-18 DIAGNOSIS — R4182 Altered mental status, unspecified: Secondary | ICD-10-CM | POA: Diagnosis not present

## 2023-02-18 DIAGNOSIS — R413 Other amnesia: Secondary | ICD-10-CM

## 2023-02-18 DIAGNOSIS — R4189 Other symptoms and signs involving cognitive functions and awareness: Secondary | ICD-10-CM

## 2023-02-18 NOTE — Telephone Encounter (Signed)
Pt's Daughter asking if pt can still come. Pt not wanting to corporate, working on getting her in the car. She is asking if can come if get here after 2 pm Message EEG tech, Tresa Endo if ok for pt to still come. Tresa Endo stated, I can give 15 min extra but that is all. Message front desk to let them  know Tresa Endo will see her if arrive 15 mins late

## 2023-02-19 DIAGNOSIS — M25462 Effusion, left knee: Secondary | ICD-10-CM | POA: Diagnosis not present

## 2023-02-19 DIAGNOSIS — M170 Bilateral primary osteoarthritis of knee: Secondary | ICD-10-CM | POA: Diagnosis not present

## 2023-02-19 DIAGNOSIS — F0150 Vascular dementia without behavioral disturbance: Secondary | ICD-10-CM | POA: Diagnosis not present

## 2023-02-19 DIAGNOSIS — N183 Chronic kidney disease, stage 3 unspecified: Secondary | ICD-10-CM | POA: Diagnosis not present

## 2023-02-19 DIAGNOSIS — D631 Anemia in chronic kidney disease: Secondary | ICD-10-CM | POA: Diagnosis not present

## 2023-02-19 DIAGNOSIS — E1122 Type 2 diabetes mellitus with diabetic chronic kidney disease: Secondary | ICD-10-CM | POA: Diagnosis not present

## 2023-02-19 DIAGNOSIS — G8929 Other chronic pain: Secondary | ICD-10-CM | POA: Diagnosis not present

## 2023-02-19 DIAGNOSIS — I679 Cerebrovascular disease, unspecified: Secondary | ICD-10-CM | POA: Diagnosis not present

## 2023-02-19 DIAGNOSIS — I129 Hypertensive chronic kidney disease with stage 1 through stage 4 chronic kidney disease, or unspecified chronic kidney disease: Secondary | ICD-10-CM | POA: Diagnosis not present

## 2023-02-20 ENCOUNTER — Emergency Department (HOSPITAL_COMMUNITY): Payer: Medicare PPO

## 2023-02-20 ENCOUNTER — Emergency Department (HOSPITAL_COMMUNITY)
Admission: EM | Admit: 2023-02-20 | Discharge: 2023-02-20 | Disposition: A | Discharge status: Home / Self Care | Payer: Medicare PPO | Attending: Emergency Medicine | Admitting: Emergency Medicine

## 2023-02-20 DIAGNOSIS — F0390 Unspecified dementia without behavioral disturbance: Secondary | ICD-10-CM | POA: Insufficient documentation

## 2023-02-20 DIAGNOSIS — M25462 Effusion, left knee: Secondary | ICD-10-CM | POA: Diagnosis not present

## 2023-02-20 DIAGNOSIS — D631 Anemia in chronic kidney disease: Secondary | ICD-10-CM | POA: Diagnosis not present

## 2023-02-20 DIAGNOSIS — N183 Chronic kidney disease, stage 3 unspecified: Secondary | ICD-10-CM | POA: Diagnosis not present

## 2023-02-20 DIAGNOSIS — I491 Atrial premature depolarization: Secondary | ICD-10-CM | POA: Diagnosis not present

## 2023-02-20 DIAGNOSIS — R55 Syncope and collapse: Secondary | ICD-10-CM | POA: Diagnosis not present

## 2023-02-20 DIAGNOSIS — E1122 Type 2 diabetes mellitus with diabetic chronic kidney disease: Secondary | ICD-10-CM | POA: Diagnosis not present

## 2023-02-20 DIAGNOSIS — R0902 Hypoxemia: Secondary | ICD-10-CM | POA: Diagnosis not present

## 2023-02-20 DIAGNOSIS — R4020 Unspecified coma: Secondary | ICD-10-CM | POA: Diagnosis not present

## 2023-02-20 DIAGNOSIS — F0150 Vascular dementia without behavioral disturbance: Secondary | ICD-10-CM | POA: Diagnosis not present

## 2023-02-20 DIAGNOSIS — G8929 Other chronic pain: Secondary | ICD-10-CM | POA: Diagnosis not present

## 2023-02-20 DIAGNOSIS — I129 Hypertensive chronic kidney disease with stage 1 through stage 4 chronic kidney disease, or unspecified chronic kidney disease: Secondary | ICD-10-CM | POA: Diagnosis not present

## 2023-02-20 DIAGNOSIS — I959 Hypotension, unspecified: Secondary | ICD-10-CM | POA: Diagnosis not present

## 2023-02-20 DIAGNOSIS — M545 Low back pain, unspecified: Secondary | ICD-10-CM | POA: Diagnosis not present

## 2023-02-20 DIAGNOSIS — I679 Cerebrovascular disease, unspecified: Secondary | ICD-10-CM | POA: Diagnosis not present

## 2023-02-20 DIAGNOSIS — M170 Bilateral primary osteoarthritis of knee: Secondary | ICD-10-CM | POA: Diagnosis not present

## 2023-02-20 LAB — CBC WITH DIFFERENTIAL/PLATELET
Abs Immature Granulocytes: 0.02 10*3/uL (ref 0.00–0.07)
Basophils Absolute: 0 10*3/uL (ref 0.0–0.1)
Basophils Relative: 0 %
Eosinophils Absolute: 0.1 10*3/uL (ref 0.0–0.5)
Eosinophils Relative: 1 %
HCT: 32.2 % — ABNORMAL LOW (ref 36.0–46.0)
Hemoglobin: 10.8 g/dL — ABNORMAL LOW (ref 12.0–15.0)
Immature Granulocytes: 0 %
Lymphocytes Relative: 15 %
Lymphs Abs: 0.7 10*3/uL (ref 0.7–4.0)
MCH: 31.1 pg (ref 26.0–34.0)
MCHC: 33.5 g/dL (ref 30.0–36.0)
MCV: 92.8 fL (ref 80.0–100.0)
Monocytes Absolute: 0.3 10*3/uL (ref 0.1–1.0)
Monocytes Relative: 7 %
Neutro Abs: 3.5 10*3/uL (ref 1.7–7.7)
Neutrophils Relative %: 77 %
Platelets: 211 10*3/uL (ref 150–400)
RBC: 3.47 MIL/uL — ABNORMAL LOW (ref 3.87–5.11)
RDW: 14.9 % (ref 11.5–15.5)
WBC: 4.6 10*3/uL (ref 4.0–10.5)
nRBC: 0 % (ref 0.0–0.2)

## 2023-02-20 LAB — COMPREHENSIVE METABOLIC PANEL
ALT: 17 U/L (ref 0–44)
AST: 24 U/L (ref 15–41)
Albumin: 3.4 g/dL — ABNORMAL LOW (ref 3.5–5.0)
Alkaline Phosphatase: 65 U/L (ref 38–126)
Anion gap: 8 (ref 5–15)
BUN: 15 mg/dL (ref 8–23)
CO2: 25 mmol/L (ref 22–32)
Calcium: 9.2 mg/dL (ref 8.9–10.3)
Chloride: 98 mmol/L (ref 98–111)
Creatinine, Ser: 0.67 mg/dL (ref 0.44–1.00)
GFR, Estimated: >60 mL/min (ref >60)
Glucose, Bld: 95 mg/dL (ref 70–99)
Potassium: 3.7 mmol/L (ref 3.5–5.1)
Sodium: 131 mmol/L — ABNORMAL LOW (ref 135–145)
Total Bilirubin: 0.8 mg/dL (ref 0.3–1.2)
Total Protein: 6.1 g/dL — ABNORMAL LOW (ref 6.5–8.1)

## 2023-02-20 LAB — URINALYSIS, ROUTINE W REFLEX MICROSCOPIC
Bilirubin Urine: NEGATIVE
Glucose, UA: NEGATIVE mg/dL
Hgb urine dipstick: NEGATIVE
Ketones, ur: NEGATIVE mg/dL
Leukocytes,Ua: NEGATIVE
Nitrite: NEGATIVE
Protein, ur: NEGATIVE mg/dL
Specific Gravity, Urine: 1.011 (ref 1.005–1.030)
pH: 7 (ref 5.0–8.0)

## 2023-02-20 LAB — TROPONIN I (HIGH SENSITIVITY)
Troponin I (High Sensitivity): 6 ng/L (ref <18)
Troponin I (High Sensitivity): 8 ng/L (ref <18)

## 2023-02-20 NOTE — ED Provider Notes (Signed)
[place] EMERGENCY DEPARTMENT AT [facility] Provider Note   CSN: [id number] Arrival date & time: 02/20/23  1458     History {Add pertinent medical, surgical, social history, OB history to HPI:1} Chief Complaint  Patient presents with   Near Syncope    Lauren Garrett is a 77 y.o. female.  The history is provided by the patient and the EMS personnel. History limited by: Dementia.  Loss of Consciousness Episode history:  Single Most recent episode:  Today Duration:  10 seconds Progression:  Improving Chronicity:  New Context: normal activity and standing up   Witnessed: yes   Associated symptoms: no chest pain, no diaphoresis, no difficulty breathing, no dizziness, no fever, no focal weakness, no headaches, no nausea, no palpitations, no recent fall, no recent injury, no recent surgery, no rectal bleeding, no shortness of breath, no visual change, no vomiting and no weakness        Home Medications Prior to Admission medications   Medication Sig Start Date End Date Taking? Authorizing Provider  acetaminophen (TYLENOL) 325 MG tablet Take 2 tablets (650 mg total) by mouth every 6 (six) hours as needed for mild pain (or Fever >/= 101). 12/09/22   Rhetta Mura, MD  atorvastatin (LIPITOR) 20 MG tablet Take 20 mg by mouth daily.    [provider]  cholecalciferol (VITAMIN D3) 25 MCG (1000 UNIT) tablet Take 1,000 Units by mouth daily.    [provider]  diclofenac sodium (VOLTAREN) 1 % GEL Apply 2 g topically 4 (four) times daily as needed (pain).    [provider]  divalproex (DEPAKOTE) 125 MG DR tablet Take 125 mg by mouth daily. 01/20/23   [provider]  donepezil (ARICEPT) 10 MG tablet Take 1 tablet (10 mg total) by mouth at bedtime. 01/20/23   Ihor Austin, NP  iron polysaccharides (NIFEREX) 150 MG capsule Take 150 mg by mouth 2 (two) times daily. Nu iron    [provider]  memantine (NAMENDA TITRATION  PAK) tablet pack 5 mg/day for =1 week; 5 mg twice daily for =1 week; 15 mg/day given in 5 mg and 10 mg separated doses for =1 week; then 10 mg twice daily 02/03/23   Ihor Austin, NP  Multiple Vitamin (MULTIVITAMIN WITH MINERALS) TABS Take 1 tablet by mouth daily.    [provider]  ondansetron (ZOFRAN) 4 MG tablet Take 1 tablet (4 mg total) by mouth every 6 (six) hours as needed for nausea. 12/09/22   Rhetta Mura, MD  potassium chloride SA (KLOR-CON M) 20 MEQ tablet Take 2 tablets (40 mEq total) by mouth daily. 12/10/22   Rhetta Mura, MD  SYNTHROID 88 MCG tablet Take 88 mcg by mouth daily. 06/26/21   [provider]  traZODone (DESYREL) 50 MG tablet Take 1 tablet (50 mg total) by mouth at bedtime. Patient taking differently: Take 100 mg by mouth at bedtime. 12/11/22   Zigmund Daniel., MD      Allergies    Other, Topamax [topiramate], and Macrobid [nitrofurantoin monohydrate macrocrystals]    Review of Systems   Review of Systems  Constitutional:  Negative for diaphoresis and fever.  Respiratory:  Negative for shortness of breath.   Cardiovascular:  Positive for syncope. Negative for chest pain and palpitations.  Gastrointestinal:  Negative for nausea and vomiting.  Neurological:  Negative for dizziness, focal weakness, weakness and headaches.    Physical Exam Updated Vital Signs BP (!) 149/77   Pulse 82  Temp 98.6 F (37 C) (Oral)   Resp 18   SpO2 99%  Physical Exam Vitals and nursing note reviewed.  Constitutional:      General: She is not in acute distress.    Appearance: She is well-developed. She is not diaphoretic.  HENT:     Head: Normocephalic and atraumatic.     Right Ear: External ear normal.     Left Ear: External ear normal.     Nose: Nose normal.     Mouth/Throat:     Mouth: Mucous membranes are moist.  Eyes:     General: No scleral icterus.    Conjunctiva/sclera: Conjunctivae normal.  Cardiovascular:     Rate and  Rhythm: Normal rate and regular rhythm.     Heart sounds: Normal heart sounds. No murmur heard.    No friction rub. No gallop.  Pulmonary:     Effort: Pulmonary effort is normal. No respiratory distress.     Breath sounds: Normal breath sounds.  Abdominal:     General: Bowel sounds are normal. There is no distension.     Palpations: Abdomen is soft. There is no mass.     Tenderness: There is no abdominal tenderness. There is no guarding.  Musculoskeletal:     Cervical back: Normal range of motion.  Skin:    General: Skin is warm and dry.  Neurological:     Mental Status: She is alert and oriented to person, place, and time.  Psychiatric:        Behavior: Behavior normal.     ED Results / Procedures / Treatments   Labs (all labs ordered are listed, but only abnormal results are displayed) Labs Reviewed  CBC WITH DIFFERENTIAL/PLATELET - Abnormal; Notable for the following components:      Result Value   RBC 3.47 (*)    Hemoglobin 10.8 (*)    HCT 32.2 (*)    All other components within normal limits  COMPREHENSIVE METABOLIC PANEL - Abnormal; Notable for the following components:   Sodium 131 (*)    Total Protein 6.1 (*)    Albumin 3.4 (*)    All other components within normal limits  URINALYSIS, ROUTINE W REFLEX MICROSCOPIC - Abnormal; Notable for the following components:   APPearance HAZY (*)    All other components within normal limits  TROPONIN I (HIGH SENSITIVITY)  TROPONIN I (HIGH SENSITIVITY)    EKG EKG Interpretation Date/Time:  Friday February 20 2023 15:29:15 EDT Ventricular Rate:  62 PR Interval:  138 QRS Duration:  81 QT Interval:  419 QTC Calculation: 426 R Axis:   45  Text Interpretation: Sinus rhythm Probable anteroseptal infarct, old No significant change since last tracing Confirmed by Jacalyn Lefevre [phone]) on 02/20/2023 3:38:49 PM  Radiology DG Chest 1 View  Result Date: 02/20/2023 CLINICAL DATA:  Loss of consciousness EXAM: CHEST  1 VIEW  COMPARISON:  12/04/2022 FINDINGS: The heart size and mediastinal contours are within normal limits. Both lungs are clear. The visualized skeletal structures are unremarkable. IMPRESSION: Normal study. Electronically Signed   By: Charlett Nose M.D.   On: 02/20/2023 18:24   EEG adult        [name] [address] [place]. [place] [address] [phone]      Electroencephalogram Procedure Note Lauren Garrett Date of Birth:  02/09/1946 Medical Record Number:  [id number] Indications: Diagnostic Date of Procedure 02/18/2023 Medications: valproic acid (Depakote) Clinical history : 77 year old patient with dementia being evaluated for functional decline  and seizures Technical Description This study was performed using 17 channel digital electroencephalographic recording equipment. International 10-20 electrode placement was used. The record was obtained with the patient awake, drowsy, and asleep.  The record is of fair technical quality for purposes of interpretation. Activation Procedures:  photic stimulation . EEG Description Awake: Alpha Activity: The waking state record contains a well-defined bi-occipital alpha rhythm of  moderate amplitude with a dominant frequency of 8 Hz. Reactivity is uncertain. No paroxsymal activity, spikes, or sharp waves are noted.  Intermittent 5 to 6 Hz rhythmic symmetric diffuse slowing is noted bilaterally. Technical component of study is adequate. EKG tracing shows regular sinus rhythm Length of this recording is 26 minutes and 19 seconds Sleep: With drowsiness, there is attenuation of the background alpha activity. As the patient enters into light sleep, vertex waves and symmetrical spindles are noted. K complexes are noted in sleep. Transition to the waking state is unremarkable. Result of Activation Procedures: Hyperventilation: N/A. Photo Stimulation: No photic driving response is noted. Summary This is a mildly abnormal EEG due to the presence of  moderate bihemispheric slowing which is a nonspecific finding seen in a variety of conditions including dementia, toxic metabolic hypoxic etiologies.  No definite epileptiform activity is noted.    Procedures Procedures  {Document cardiac monitor, telemetry assessment procedure when appropriate:1}  Medications Ordered in ED Medications - No data to display  ED Course/ Medical Decision Making/ A&P Clinical Course as of 02/20/23 1948  Fri Feb 20, 2023  1725 Hemoglobin(!): 10.8 [AH]  1726 EKG 12-Lead [AH]  1726 DG Chest 1 View [AH]  1726 Troponin I (High Sensitivity) [AH]  1944 Sodium(!): 131 [AH]  1944 Hemoglobin(!): 10.8 [AH]  1944 Troponin I (High Sensitivity) [AH]    Clinical Course User Index [AH] Arthor Captain, PA-C   {   Click here for ABCD2, HEART and other calculatorsREFRESH Note before signing :1}                    [place] Syncope Rule Score: 0          Medical Decision Making This patient presents to the ED for concern of syncope, this involves an extensive number of treatment options, and is a complaint that carries with it a high risk of complications and morbidity.  The differential for syncope is extensive and includes, but is not limited to: arrythmia (Vtach, SVT, SSS, sinus arrest, AV block, bradycardia) aortic stenosis, AMI, HOCM, PE, atrial myxoma, pulmonary hypertension, orthostatic hypotension, (hypovolemia, drug effect, GB syndrome, micturition, cough, swall) carotid sinus sensitivity, Seizure, TIA/CVA, hypoglycemia,  Vertigo.   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  Per EMS report, patient was at home with her physical therapist today.  Patient was walking near the therapist to do some exercise when she got lightheaded.  She was caught by her physical therapist and sat in  a chair where she had 10 to 15 seconds of near syncope or syncope.  She is unable to tell me if she had any chest pain or melena or hematochezia however EMS reports that she was not complaining of any chest pain.  They do report they initial blood pressure was 80 systolic.  She was given 500 mL of fluid prior to arrival with increase in her blood pressure to 120 systolic.  She is  not on any blood thinners.  Co morbidities:      ***  Social Determinants of Health:       ***  Additional history:  {Additional history obtained from ***} {External records from outside source obtained and reviewed including}  Lab Tests:  I Ordered, and personally interpreted labs.  The pertinent results include:  ***   Imaging Studies:  I ordered imaging studies including *** I independently visualized and interpreted imaging which showed *** I agree with the radiologist interpretation  Cardiac Monitoring/ECG:       The patient was maintained on a cardiac monitor.  I personally viewed and interpreted the cardiac monitored which showed an underlying rhythm of: ***  Medicines ordered and prescription drug management:  I ordered medication including Medications - No data to display for *** Reevaluation of the patient after these medicines showed that the patient {resolved/improved/worsened:23923::"improved"} I have reviewed the patients home medicines and have made adjustments as needed  Test Considered:       ***  Critical Interventions:       ***  Consultations Obtained: ***  Problem List / ED Course:       No diagnosis found.  MDM: ***   Dispostion:  After consideration of the diagnostic results and the patients response to treatment, I feel that the patent would benefit from ***.    Amount and/or Complexity of Data Reviewed Labs: ordered. Decision-making details documented in ED Course. Radiology: ordered. Decision-making details documented in ED Course. ECG/medicine  tests: ordered. Decision-making details documented in ED Course.   ***  {Document critical care time when appropriate:1} {Document review of labs and clinical decision tools ie heart score, Chads2Vasc2 etc:1}  {Document your independent review of radiology images, and any outside records:1} {Document your discussion with family members, caretakers, and with consultants:1} {Document social determinants of health affecting pt's care:1} {Document your decision making why or why not admission, treatments were needed:1} Final Clinical Impression(s) / ED Diagnoses Final diagnoses:  Near syncope    Rx / DC Orders ED Discharge Orders     None

## 2023-02-20 NOTE — ED Triage Notes (Signed)
Pt from home, near syncope episode while doing PT. Hx. Dementia.

## 2023-02-20 NOTE — ED Provider Notes (Incomplete)
Lauren Garrett Provider Note   CSN: [id number] Arrival date & time: 02/20/23  1458     History {Add pertinent medical, surgical, social history, OB history to HPI:1} Chief Complaint  Patient presents with   Near Syncope    Lauren Garrett is a 77 y.o. female.   Near Syncope       Home Medications Prior to Admission medications   Medication Sig Start Date End Date Taking? Authorizing Provider  acetaminophen (TYLENOL) 325 MG tablet Take 2 tablets (650 mg total) by mouth every 6 (six) hours as needed for mild pain (or Fever >/= 101). 12/09/22   Rhetta Mura, MD  atorvastatin (LIPITOR) 20 MG tablet Take 20 mg by mouth daily.    [provider]  cholecalciferol (VITAMIN D3) 25 MCG (1000 UNIT) tablet Take 1,000 Units by mouth daily.    [provider]  diclofenac sodium (VOLTAREN) 1 % GEL Apply 2 g topically 4 (four) times daily as needed (pain).    [provider]  divalproex (DEPAKOTE) 125 MG DR tablet Take 125 mg by mouth daily. 01/20/23   [provider]  donepezil (ARICEPT) 10 MG tablet Take 1 tablet (10 mg total) by mouth at bedtime. 01/20/23   Ihor Austin, NP  iron polysaccharides (NIFEREX) 150 MG capsule Take 150 mg by mouth 2 (two) times daily. Nu iron    [provider]  memantine (NAMENDA TITRATION PAK) tablet pack 5 mg/day for =1 week; 5 mg twice daily for =1 week; 15 mg/day given in 5 mg and 10 mg separated doses for =1 week; then 10 mg twice daily 02/03/23   Ihor Austin, NP  Multiple Vitamin (MULTIVITAMIN WITH MINERALS) TABS Take 1 tablet by mouth daily.    [provider]  ondansetron (ZOFRAN) 4 MG tablet Take 1 tablet (4 mg total) by mouth every 6 (six) hours as needed for nausea. 12/09/22   Rhetta Mura, MD  potassium chloride SA (KLOR-CON M) 20 MEQ tablet Take 2 tablets (40 mEq total) by mouth daily. 12/10/22   Rhetta Mura, MD  SYNTHROID 88  MCG tablet Take 88 mcg by mouth daily. 06/26/21   [provider]  traZODone (DESYREL) 50 MG tablet Take 1 tablet (50 mg total) by mouth at bedtime. Patient taking differently: Take 100 mg by mouth at bedtime. 12/11/22   Zigmund Daniel., MD      Allergies    Other, Topamax [topiramate], and Macrobid [nitrofurantoin monohydrate macrocrystals]    Review of Systems   Review of Systems  Cardiovascular:  Positive for near-syncope.    Physical Exam Updated Vital Signs There were no vitals taken for this visit. Physical Exam  ED Results / Procedures / Treatments   Labs (all labs ordered are listed, but only abnormal results are displayed) Labs Reviewed - No data to display  EKG None  Radiology EEG adult  Result Date: 02/19/2023       [facility] [address] [place]. [place] [address] [phone]      Electroencephalogram Procedure Note Lauren Garrett Date of Birth:  October 13, 1945 Medical Record Number:  [id number] Indications: Diagnostic Date of Procedure 02/18/2023 Medications: valproic acid (Depakote) Clinical history : 78 year old patient with dementia being evaluated for functional decline and seizures Technical Description This study was performed using 17 channel digital electroencephalographic recording equipment. International 10-20 electrode placement was used. The record was obtained with the patient awake, drowsy, and asleep.  The record is  of fair technical quality for purposes of interpretation. Activation Procedures:  photic stimulation . EEG Description Awake: Alpha Activity: The waking state record contains a well-defined bi-occipital alpha rhythm of  moderate amplitude with a dominant frequency of 8 Hz. Reactivity is uncertain. No paroxsymal activity, spikes, or sharp waves are noted.  Intermittent 5 to 6 Hz rhythmic symmetric diffuse slowing is noted bilaterally. Technical component of study is adequate. EKG tracing shows regular sinus  rhythm Length of this recording is 26 minutes and 19 seconds Sleep: With drowsiness, there is attenuation of the background alpha activity. As the patient enters into light sleep, vertex waves and symmetrical spindles are noted. K complexes are noted in sleep. Transition to the waking state is unremarkable. Result of Activation Procedures: Hyperventilation: N/A. Photo Stimulation: No photic driving response is noted. Summary This is a mildly abnormal EEG due to the presence of moderate bihemispheric slowing which is a nonspecific finding seen in a variety of conditions including dementia, toxic metabolic hypoxic etiologies.  No definite epileptiform activity is noted.    Procedures Procedures  {Document cardiac monitor, telemetry assessment procedure when appropriate:1}  Medications Ordered in ED Medications - No data to display  ED Course/ Medical Decision Making/ A&P   {   Click here for ABCD2, HEART and other calculatorsREFRESH Note before signing :1}                              Medical Decision Making This patient presents to the ED for concern of syncope, this involves an extensive number of treatment options, and is a complaint that carries with it a high risk of complications and morbidity.  The differential for syncope is extensive and includes, but is not limited to: arrythmia (Vtach, SVT, SSS, sinus arrest, AV block, bradycardia) aortic stenosis, AMI, HOCM, PE, atrial myxoma, pulmonary hypertension, orthostatic hypotension, (hypovolemia, drug effect, GB syndrome, micturition, cough, swall) carotid sinus sensitivity, Seizure, TIA/CVA, hypoglycemia,  Vertigo.   Co morbidities:      ***  Social Determinants of Health:       ***  Additional history:  {Additional history obtained from ***} {External records from outside source obtained and reviewed including}  Lab Tests:  I Ordered, and personally interpreted labs.  The pertinent results include:  ***   Imaging Studies:  I  ordered imaging studies including *** I independently visualized and interpreted imaging which showed *** I agree with the radiologist interpretation  Cardiac Monitoring/ECG:       The patient was maintained on a cardiac monitor.  I personally viewed and interpreted the cardiac monitored which showed an underlying rhythm of: ***  Medicines ordered and prescription drug management:  I ordered medication including Medications - No data to display for *** Reevaluation of the patient after these medicines showed that the patient {resolved/improved/worsened:23923::"improved"} I have reviewed the patients home medicines and have made adjustments as needed  Test Considered:       ***  Critical Interventions:       ***  Consultations Obtained: ***  Problem List / ED Course:       No diagnosis found.  MDM: ***   Dispostion:  After consideration of the diagnostic results and the patients response to treatment, I feel that the patent would benefit from ***.      ***  {Document critical care time when appropriate:1} {Document review of labs and clinical decision tools ie heart score, Chads2Vasc2 etc:1}  {Document  your independent review of radiology images, and any outside records:1} {Document your discussion with family members, caretakers, and with consultants:1} {Document social determinants of health affecting pt's care:1} {Document your decision making why or why not admission, treatments were needed:1} Final Clinical Impression(s) / ED Diagnoses Final diagnoses:  None    Rx / DC Orders ED Discharge Orders     None

## 2023-02-20 NOTE — Discharge Instructions (Signed)
Follow these instructions at home: Medicines Take over-the-counter and prescription medicines only as told by your health care provider. If you are taking blood pressure or heart medicine, get up slowly and take several minutes to sit and then stand. This can reduce dizziness and decrease the risk of near-syncope. Lifestyle Do not drive, use machinery, or play sports until your health care provider says it is okay. Do not drink alcohol. Do not use any products that contain nicotine or tobacco. These products include cigarettes, chewing tobacco, and vaping devices, such as e-cigarettes. If you need help quitting, ask your health care provider. Avoid hot tubs and saunas. General instructions Pay attention to any changes in your symptoms. Talk with your health care provider about your symptoms. You may need to have testing to understand the cause of your near-syncope. If you start to feel like you might faint, sit or lie down right away. If sitting, put your head down between your legs. If lying down, raise (elevate) your feet above the level of your heart. Breathe deeply and steadily. Wait until all of the symptoms have passed. Have someone stay with you until you feel stable. Drink enough fluid to keep your urine pale yellow. Avoid prolonged standing. If you must stand for a long time, do movements such as: Moving your legs. Crossing your legs. Flexing and stretching your leg muscles. Squatting. Keep all follow-up visits. This is important. Contact a health care provider if: You continue to have episodes of near fainting. Get help right away if: You faint. You have any of these symptoms that may indicate trouble with your heart: Fast or irregular heartbeats (palpitations). Unusual pain in your chest, abdomen, or back. Shortness of breath. You have a seizure. You have a severe headache. You are confused. You have vision problems. You have severe weakness or trouble walking. You are  bleeding from your mouth or rectum, or have black or tarry stool. These symptoms may represent a serious problem that is an emergency. Do not wait to see if your symptoms will go away. Get medical help right away. Call your local emergency services (911 in the [place] Do not drive yourself to the hospital. Summary Near-syncope is when you suddenly feel like you might pass out or faint, but you do not actually lose consciousness. This condition is caused by a sudden decrease in blood flow to the brain. This decrease can result from various causes, but most of those causes are not dangerous. Near-syncope may be a sign of a serious medical problem, so it is important to seek medical care. If you start to feel like you might faint, sit or lie down right away. If sitting, put your head down between your legs. If lying down, raise (elevate) your feet above the level of your heart. Talk with your health care provider about your symptoms. You may need to have testing to understand the cause of your near-syncope. This information is not intended to replace advice given to you by your health care provider. Make sure you discuss any questions you have with your health care provider.

## 2023-02-25 ENCOUNTER — Ambulatory Visit: Payer: Self-pay

## 2023-02-25 NOTE — Patient Outreach (Signed)
  Care Coordination   02/25/2023 Name: Lauren Garrett MRN: [id number] DOB: 1946-02-24   Care Coordination Outreach Attempts:  An unsuccessful telephone outreach was attempted for a scheduled appointment today.  Follow Up Plan:  Additional outreach attempts will be made to offer the patient care coordination information and services.   Encounter Outcome:  No Answer   Care Coordination Interventions:  No, not indicated    Delsa Sale RN BSN CCM [place]  [facility], [facility] Health Nurse Care Coordinator  Direct Dial: [phone] Website: [email or web address]

## 2023-02-26 ENCOUNTER — Ambulatory Visit: Payer: Medicare PPO | Admitting: Podiatry

## 2023-02-26 ENCOUNTER — Encounter: Payer: Self-pay | Admitting: Podiatry

## 2023-02-26 VITALS — Ht 67.0 in | Wt 138.0 lb

## 2023-02-26 DIAGNOSIS — F0150 Vascular dementia without behavioral disturbance: Secondary | ICD-10-CM | POA: Diagnosis not present

## 2023-02-26 DIAGNOSIS — I129 Hypertensive chronic kidney disease with stage 1 through stage 4 chronic kidney disease, or unspecified chronic kidney disease: Secondary | ICD-10-CM | POA: Diagnosis not present

## 2023-02-26 DIAGNOSIS — G8929 Other chronic pain: Secondary | ICD-10-CM | POA: Diagnosis not present

## 2023-02-26 DIAGNOSIS — M170 Bilateral primary osteoarthritis of knee: Secondary | ICD-10-CM | POA: Diagnosis not present

## 2023-02-26 DIAGNOSIS — M79675 Pain in left toe(s): Secondary | ICD-10-CM | POA: Diagnosis not present

## 2023-02-26 DIAGNOSIS — G629 Polyneuropathy, unspecified: Secondary | ICD-10-CM

## 2023-02-26 DIAGNOSIS — M25462 Effusion, left knee: Secondary | ICD-10-CM | POA: Diagnosis not present

## 2023-02-26 DIAGNOSIS — B351 Tinea unguium: Secondary | ICD-10-CM

## 2023-02-26 DIAGNOSIS — M79674 Pain in right toe(s): Secondary | ICD-10-CM | POA: Diagnosis not present

## 2023-02-26 DIAGNOSIS — E1122 Type 2 diabetes mellitus with diabetic chronic kidney disease: Secondary | ICD-10-CM | POA: Diagnosis not present

## 2023-02-26 DIAGNOSIS — I679 Cerebrovascular disease, unspecified: Secondary | ICD-10-CM | POA: Diagnosis not present

## 2023-02-26 DIAGNOSIS — D631 Anemia in chronic kidney disease: Secondary | ICD-10-CM | POA: Diagnosis not present

## 2023-02-26 DIAGNOSIS — N183 Chronic kidney disease, stage 3 unspecified: Secondary | ICD-10-CM | POA: Diagnosis not present

## 2023-02-26 NOTE — Progress Notes (Signed)
Subjective:   Patient ID: Lauren Garrett, female   DOB: 77 y.o.   MRN: [id number]   HPI Patient presents with caregiver with moderate dementia with significant thickness of nailbeds 1-5 both feet with concerns about the possibility for numbness in her feet and discomfort with the patient not realizing but asking the same questions multiple times.  Patient does not smoke and is not active   Review of Systems  All other systems reviewed and are negative.       Objective:  Physical Exam Vitals and nursing note reviewed.  Constitutional:      Appearance: She is well-developed.  Pulmonary:     Effort: Pulmonary effort is normal.  Musculoskeletal:        General: Normal range of motion.  Skin:    General: Skin is warm.  Neurological:     Mental Status: She is alert.     Neurovascular status mildly reduced but intact with diminishment of sharp dull vibratory.  Patient is found to have significant thickness nailbeds 1-5 both feet yellow dystrophic and can become painful at times with patient's caregiver concerned about the overall structure of the nailbed and the neuropathic changes possible.  Good digital perfusion well-oriented     Assessment:  Chronic mycotic nail infection 1-5 both feet with possibility for neuropathic changes but difficult to determine due to patient's mental condition      Plan:  H&P tried to discuss all conditions with her and discussed the numerous times trying to make her aware of what is going on.  I debrided nailbeds 1-5 both feet Neutra genic bleeding which can be done as needed and do not recommend treatment for any form of neuropathic condition currently

## 2023-02-27 DIAGNOSIS — M170 Bilateral primary osteoarthritis of knee: Secondary | ICD-10-CM | POA: Diagnosis not present

## 2023-02-27 DIAGNOSIS — F0150 Vascular dementia without behavioral disturbance: Secondary | ICD-10-CM | POA: Diagnosis not present

## 2023-02-27 DIAGNOSIS — N183 Chronic kidney disease, stage 3 unspecified: Secondary | ICD-10-CM | POA: Diagnosis not present

## 2023-02-27 DIAGNOSIS — G8929 Other chronic pain: Secondary | ICD-10-CM | POA: Diagnosis not present

## 2023-02-27 DIAGNOSIS — E1122 Type 2 diabetes mellitus with diabetic chronic kidney disease: Secondary | ICD-10-CM | POA: Diagnosis not present

## 2023-02-27 DIAGNOSIS — I129 Hypertensive chronic kidney disease with stage 1 through stage 4 chronic kidney disease, or unspecified chronic kidney disease: Secondary | ICD-10-CM | POA: Diagnosis not present

## 2023-02-27 DIAGNOSIS — I679 Cerebrovascular disease, unspecified: Secondary | ICD-10-CM | POA: Diagnosis not present

## 2023-02-27 DIAGNOSIS — M25462 Effusion, left knee: Secondary | ICD-10-CM | POA: Diagnosis not present

## 2023-02-27 DIAGNOSIS — D631 Anemia in chronic kidney disease: Secondary | ICD-10-CM | POA: Diagnosis not present

## 2023-03-03 DIAGNOSIS — F0150 Vascular dementia without behavioral disturbance: Secondary | ICD-10-CM | POA: Diagnosis not present

## 2023-03-03 DIAGNOSIS — E1122 Type 2 diabetes mellitus with diabetic chronic kidney disease: Secondary | ICD-10-CM | POA: Diagnosis not present

## 2023-03-03 DIAGNOSIS — I679 Cerebrovascular disease, unspecified: Secondary | ICD-10-CM | POA: Diagnosis not present

## 2023-03-03 DIAGNOSIS — G8929 Other chronic pain: Secondary | ICD-10-CM | POA: Diagnosis not present

## 2023-03-03 DIAGNOSIS — M170 Bilateral primary osteoarthritis of knee: Secondary | ICD-10-CM | POA: Diagnosis not present

## 2023-03-03 DIAGNOSIS — M25462 Effusion, left knee: Secondary | ICD-10-CM | POA: Diagnosis not present

## 2023-03-03 DIAGNOSIS — N183 Chronic kidney disease, stage 3 unspecified: Secondary | ICD-10-CM | POA: Diagnosis not present

## 2023-03-03 DIAGNOSIS — D631 Anemia in chronic kidney disease: Secondary | ICD-10-CM | POA: Diagnosis not present

## 2023-03-03 DIAGNOSIS — I129 Hypertensive chronic kidney disease with stage 1 through stage 4 chronic kidney disease, or unspecified chronic kidney disease: Secondary | ICD-10-CM | POA: Diagnosis not present

## 2023-03-04 DIAGNOSIS — D631 Anemia in chronic kidney disease: Secondary | ICD-10-CM | POA: Diagnosis not present

## 2023-03-04 DIAGNOSIS — F0150 Vascular dementia without behavioral disturbance: Secondary | ICD-10-CM | POA: Diagnosis not present

## 2023-03-04 DIAGNOSIS — I129 Hypertensive chronic kidney disease with stage 1 through stage 4 chronic kidney disease, or unspecified chronic kidney disease: Secondary | ICD-10-CM | POA: Diagnosis not present

## 2023-03-04 DIAGNOSIS — I679 Cerebrovascular disease, unspecified: Secondary | ICD-10-CM | POA: Diagnosis not present

## 2023-03-04 DIAGNOSIS — N183 Chronic kidney disease, stage 3 unspecified: Secondary | ICD-10-CM | POA: Diagnosis not present

## 2023-03-04 DIAGNOSIS — G8929 Other chronic pain: Secondary | ICD-10-CM | POA: Diagnosis not present

## 2023-03-04 DIAGNOSIS — M170 Bilateral primary osteoarthritis of knee: Secondary | ICD-10-CM | POA: Diagnosis not present

## 2023-03-04 DIAGNOSIS — E1122 Type 2 diabetes mellitus with diabetic chronic kidney disease: Secondary | ICD-10-CM | POA: Diagnosis not present

## 2023-03-04 DIAGNOSIS — M25462 Effusion, left knee: Secondary | ICD-10-CM | POA: Diagnosis not present

## 2023-03-05 ENCOUNTER — Telehealth: Payer: Self-pay | Admitting: Adult Health

## 2023-03-05 DIAGNOSIS — F0150 Vascular dementia without behavioral disturbance: Secondary | ICD-10-CM | POA: Diagnosis not present

## 2023-03-05 DIAGNOSIS — M25462 Effusion, left knee: Secondary | ICD-10-CM | POA: Diagnosis not present

## 2023-03-05 DIAGNOSIS — E1122 Type 2 diabetes mellitus with diabetic chronic kidney disease: Secondary | ICD-10-CM | POA: Diagnosis not present

## 2023-03-05 DIAGNOSIS — I129 Hypertensive chronic kidney disease with stage 1 through stage 4 chronic kidney disease, or unspecified chronic kidney disease: Secondary | ICD-10-CM | POA: Diagnosis not present

## 2023-03-05 DIAGNOSIS — I679 Cerebrovascular disease, unspecified: Secondary | ICD-10-CM | POA: Diagnosis not present

## 2023-03-05 DIAGNOSIS — D631 Anemia in chronic kidney disease: Secondary | ICD-10-CM | POA: Diagnosis not present

## 2023-03-05 DIAGNOSIS — N183 Chronic kidney disease, stage 3 unspecified: Secondary | ICD-10-CM | POA: Diagnosis not present

## 2023-03-05 DIAGNOSIS — M170 Bilateral primary osteoarthritis of knee: Secondary | ICD-10-CM | POA: Diagnosis not present

## 2023-03-05 DIAGNOSIS — G8929 Other chronic pain: Secondary | ICD-10-CM | POA: Diagnosis not present

## 2023-03-05 MED ORDER — MEMANTINE HCL 10 MG PO TABS: 10.0000 mg | ORAL_TABLET | Freq: Two times a day (BID) | ORAL | 5 refills | Status: AC

## 2023-03-05 NOTE — Telephone Encounter (Signed)
Pt's son called stating that the pt is needing a refill on her memantine (NAMENDA TITRATION PAK) tablet pack because she took her last one yesterday so she is completely out. Please send to [facility] on [address]. Please advise.

## 2023-03-05 NOTE — Telephone Encounter (Signed)
Attempted to call the son back. There was no answer and VM full. Was wanting to confirm that the pt made it to goal dose of 10 mg BID. I am going to send the updated script in for that since it wasn't told otherwise.    **If the son calls back please confirm that the dose she had gotten to with the starter pack was 10 mg twice a day. That is what I sent to the pharmacy but if for some reason she didn't she can always break it in half but the goal should be for the pt to take 10 mg daily

## 2023-03-06 ENCOUNTER — Telehealth: Payer: Self-pay

## 2023-03-06 NOTE — Telephone Encounter (Signed)
Transition Care Management Unsuccessful Follow-up Telephone Call  Date of discharge and from where:  Lauren Garrett 11/1  Attempts:  2nd Attempt  Reason for unsuccessful TCM follow-up call:  No answer/busy   Lauren Garrett  [facility], [facility] Guide, Phone: [phone] Website: [email or web address]

## 2023-03-11 DIAGNOSIS — I129 Hypertensive chronic kidney disease with stage 1 through stage 4 chronic kidney disease, or unspecified chronic kidney disease: Secondary | ICD-10-CM | POA: Diagnosis not present

## 2023-03-11 DIAGNOSIS — D631 Anemia in chronic kidney disease: Secondary | ICD-10-CM | POA: Diagnosis not present

## 2023-03-11 DIAGNOSIS — M25462 Effusion, left knee: Secondary | ICD-10-CM | POA: Diagnosis not present

## 2023-03-11 DIAGNOSIS — N183 Chronic kidney disease, stage 3 unspecified: Secondary | ICD-10-CM | POA: Diagnosis not present

## 2023-03-11 DIAGNOSIS — E1122 Type 2 diabetes mellitus with diabetic chronic kidney disease: Secondary | ICD-10-CM | POA: Diagnosis not present

## 2023-03-11 DIAGNOSIS — F0150 Vascular dementia without behavioral disturbance: Secondary | ICD-10-CM | POA: Diagnosis not present

## 2023-03-11 DIAGNOSIS — G8929 Other chronic pain: Secondary | ICD-10-CM | POA: Diagnosis not present

## 2023-03-11 DIAGNOSIS — M170 Bilateral primary osteoarthritis of knee: Secondary | ICD-10-CM | POA: Diagnosis not present

## 2023-03-11 DIAGNOSIS — I679 Cerebrovascular disease, unspecified: Secondary | ICD-10-CM | POA: Diagnosis not present

## 2023-03-12 DIAGNOSIS — N183 Chronic kidney disease, stage 3 unspecified: Secondary | ICD-10-CM | POA: Diagnosis not present

## 2023-03-12 DIAGNOSIS — G8929 Other chronic pain: Secondary | ICD-10-CM | POA: Diagnosis not present

## 2023-03-12 DIAGNOSIS — E1122 Type 2 diabetes mellitus with diabetic chronic kidney disease: Secondary | ICD-10-CM | POA: Diagnosis not present

## 2023-03-12 DIAGNOSIS — D631 Anemia in chronic kidney disease: Secondary | ICD-10-CM | POA: Diagnosis not present

## 2023-03-12 DIAGNOSIS — F0150 Vascular dementia without behavioral disturbance: Secondary | ICD-10-CM | POA: Diagnosis not present

## 2023-03-12 DIAGNOSIS — I679 Cerebrovascular disease, unspecified: Secondary | ICD-10-CM | POA: Diagnosis not present

## 2023-03-12 DIAGNOSIS — M170 Bilateral primary osteoarthritis of knee: Secondary | ICD-10-CM | POA: Diagnosis not present

## 2023-03-12 DIAGNOSIS — M25462 Effusion, left knee: Secondary | ICD-10-CM | POA: Diagnosis not present

## 2023-03-12 DIAGNOSIS — I129 Hypertensive chronic kidney disease with stage 1 through stage 4 chronic kidney disease, or unspecified chronic kidney disease: Secondary | ICD-10-CM | POA: Diagnosis not present

## 2023-03-13 DIAGNOSIS — M25462 Effusion, left knee: Secondary | ICD-10-CM | POA: Diagnosis not present

## 2023-03-13 DIAGNOSIS — N183 Chronic kidney disease, stage 3 unspecified: Secondary | ICD-10-CM | POA: Diagnosis not present

## 2023-03-13 DIAGNOSIS — F0150 Vascular dementia without behavioral disturbance: Secondary | ICD-10-CM | POA: Diagnosis not present

## 2023-03-13 DIAGNOSIS — E1122 Type 2 diabetes mellitus with diabetic chronic kidney disease: Secondary | ICD-10-CM | POA: Diagnosis not present

## 2023-03-13 DIAGNOSIS — M170 Bilateral primary osteoarthritis of knee: Secondary | ICD-10-CM | POA: Diagnosis not present

## 2023-03-13 DIAGNOSIS — D631 Anemia in chronic kidney disease: Secondary | ICD-10-CM | POA: Diagnosis not present

## 2023-03-13 DIAGNOSIS — G8929 Other chronic pain: Secondary | ICD-10-CM | POA: Diagnosis not present

## 2023-03-13 DIAGNOSIS — I679 Cerebrovascular disease, unspecified: Secondary | ICD-10-CM | POA: Diagnosis not present

## 2023-03-13 DIAGNOSIS — I129 Hypertensive chronic kidney disease with stage 1 through stage 4 chronic kidney disease, or unspecified chronic kidney disease: Secondary | ICD-10-CM | POA: Diagnosis not present

## 2023-03-18 DIAGNOSIS — I129 Hypertensive chronic kidney disease with stage 1 through stage 4 chronic kidney disease, or unspecified chronic kidney disease: Secondary | ICD-10-CM | POA: Diagnosis not present

## 2023-03-18 DIAGNOSIS — F0150 Vascular dementia without behavioral disturbance: Secondary | ICD-10-CM | POA: Diagnosis not present

## 2023-03-18 DIAGNOSIS — M25462 Effusion, left knee: Secondary | ICD-10-CM | POA: Diagnosis not present

## 2023-03-18 DIAGNOSIS — I679 Cerebrovascular disease, unspecified: Secondary | ICD-10-CM | POA: Diagnosis not present

## 2023-03-18 DIAGNOSIS — N183 Chronic kidney disease, stage 3 unspecified: Secondary | ICD-10-CM | POA: Diagnosis not present

## 2023-03-18 DIAGNOSIS — M170 Bilateral primary osteoarthritis of knee: Secondary | ICD-10-CM | POA: Diagnosis not present

## 2023-03-18 DIAGNOSIS — D631 Anemia in chronic kidney disease: Secondary | ICD-10-CM | POA: Diagnosis not present

## 2023-03-18 DIAGNOSIS — G8929 Other chronic pain: Secondary | ICD-10-CM | POA: Diagnosis not present

## 2023-03-18 DIAGNOSIS — E1122 Type 2 diabetes mellitus with diabetic chronic kidney disease: Secondary | ICD-10-CM | POA: Diagnosis not present

## 2023-03-21 DIAGNOSIS — M170 Bilateral primary osteoarthritis of knee: Secondary | ICD-10-CM | POA: Diagnosis not present

## 2023-03-23 DIAGNOSIS — M4800 Spinal stenosis, site unspecified: Secondary | ICD-10-CM | POA: Diagnosis not present

## 2023-03-23 DIAGNOSIS — E871 Hypo-osmolality and hyponatremia: Secondary | ICD-10-CM | POA: Diagnosis not present

## 2023-03-23 DIAGNOSIS — E1129 Type 2 diabetes mellitus with other diabetic kidney complication: Secondary | ICD-10-CM | POA: Diagnosis not present

## 2023-03-23 DIAGNOSIS — F01B0 Vascular dementia, moderate, without behavioral disturbance, psychotic disturbance, mood disturbance, and anxiety: Secondary | ICD-10-CM | POA: Diagnosis not present

## 2023-03-23 DIAGNOSIS — I129 Hypertensive chronic kidney disease with stage 1 through stage 4 chronic kidney disease, or unspecified chronic kidney disease: Secondary | ICD-10-CM | POA: Diagnosis not present

## 2023-03-23 DIAGNOSIS — R410 Disorientation, unspecified: Secondary | ICD-10-CM | POA: Diagnosis not present

## 2023-03-23 DIAGNOSIS — N1831 Chronic kidney disease, stage 3a: Secondary | ICD-10-CM | POA: Diagnosis not present

## 2023-03-23 DIAGNOSIS — K589 Irritable bowel syndrome without diarrhea: Secondary | ICD-10-CM | POA: Diagnosis not present

## 2023-03-26 DIAGNOSIS — G8929 Other chronic pain: Secondary | ICD-10-CM | POA: Diagnosis not present

## 2023-03-26 DIAGNOSIS — D631 Anemia in chronic kidney disease: Secondary | ICD-10-CM | POA: Diagnosis not present

## 2023-03-26 DIAGNOSIS — N183 Chronic kidney disease, stage 3 unspecified: Secondary | ICD-10-CM | POA: Diagnosis not present

## 2023-03-26 DIAGNOSIS — M25462 Effusion, left knee: Secondary | ICD-10-CM | POA: Diagnosis not present

## 2023-03-26 DIAGNOSIS — I129 Hypertensive chronic kidney disease with stage 1 through stage 4 chronic kidney disease, or unspecified chronic kidney disease: Secondary | ICD-10-CM | POA: Diagnosis not present

## 2023-03-26 DIAGNOSIS — E1122 Type 2 diabetes mellitus with diabetic chronic kidney disease: Secondary | ICD-10-CM | POA: Diagnosis not present

## 2023-03-26 DIAGNOSIS — I679 Cerebrovascular disease, unspecified: Secondary | ICD-10-CM | POA: Diagnosis not present

## 2023-03-26 DIAGNOSIS — F0150 Vascular dementia without behavioral disturbance: Secondary | ICD-10-CM | POA: Diagnosis not present

## 2023-03-26 DIAGNOSIS — M170 Bilateral primary osteoarthritis of knee: Secondary | ICD-10-CM | POA: Diagnosis not present

## 2023-03-30 DIAGNOSIS — I129 Hypertensive chronic kidney disease with stage 1 through stage 4 chronic kidney disease, or unspecified chronic kidney disease: Secondary | ICD-10-CM | POA: Diagnosis not present

## 2023-03-30 DIAGNOSIS — N1831 Chronic kidney disease, stage 3a: Secondary | ICD-10-CM | POA: Diagnosis not present

## 2023-03-30 DIAGNOSIS — E1129 Type 2 diabetes mellitus with other diabetic kidney complication: Secondary | ICD-10-CM | POA: Diagnosis not present

## 2023-03-31 ENCOUNTER — Ambulatory Visit: Payer: Self-pay

## 2023-04-01 NOTE — Patient Instructions (Signed)
Visit Information  Thank you for taking time to visit with me today. Please don't hesitate to contact me if I can be of assistance to you.   Following are the goals we discussed today:   Goals Addressed             This Visit's Progress    RN Care Coordination Activities: further follow up needed   On track    Care Coordination Interventions: Evaluation of current treatment plan related to dementia and patient's adherence to plan as established by provider Completed joint call with daughter [name] and patient's husband  Determined patient is receiving private paid 24/7 nurse caregiver assistance, caregiver is administering patient's meds, providing meals, providing ROM and exercise as patient will allow, provides assistance with ADL's/iADL's Discussed with daughter and husband, patient has regained her mobility, knee pain is minimal, patient experienced 1 fall while in the bathroom with no know cause, no injuries incurred Discussed patient continues to have symptoms suggestive of advanced stages of Alzheimer and this continues to be difficult for the family to witness Determined family currently has no home safety concerns, no DME needs Discussed caregiver is concerned patient may be having vertigo Educated family about potential SE related to Memantine and Aricept, daughter will send Neurologist a my chart message to report symptoms and ask for recommendations Discussed and reviewed patient's next scheduled visit with PCP is scheduled for 04/24/23, patient completed labs yesterday Instructed family to report new symptoms or concerns to patient's PCP  Discussed plans with patient for ongoing care coordination follow up and provided patient with direct contact information for nurse care coordinator Patient will remain free from falls and or injuries related to falls Patient's family will contact patient's PCP to report new symptoms or concerns Patient's daughter [name] will communicate with  Neurology provide to report possible symptoms of Vertigo Patient's family will consider [facility] for in home primary care and call this RN if a referral is needed Patient's family will continue to work with LCSW as needed for resources and support        Our next appointment is by telephone on 05/01/23 at 2:00 PM  Please call the care guide team at [phone] if you need to cancel or reschedule your appointment.   If you are experiencing a Mental Health or Behavioral Health Crisis or need someone to talk to, please call 1-800-273-TALK (toll free, 24 hour hotline)  Patient verbalizes understanding of instructions and care plan provided today and agrees to view in MyChart. Active MyChart status and patient understanding of how to access instructions and care plan via MyChart confirmed with patient.     Delsa Sale RN BSN CCM [place]  [facility], [facility] Nurse Care Coordinator  Direct Dial: [phone] Website: [email or web address]

## 2023-04-01 NOTE — Patient Outreach (Signed)
Care Coordination   Follow Up Visit Note   03/31/2023 Name: Lauren Garrett MRN: [id number] DOB: [birth year]  Lauren Garrett is a 77 y.o. year old female who sees Tisovec, Adelfa Koh, MD for primary care. I spoke with patient's daughter and husband by phone today.  What matters to the patients health and wellness today?  Patient will remain free from falls and or fall related injuries.     Goals Addressed             This Visit's Progress    RN Care Coordination Activities: further follow up needed   On track    Care Coordination Interventions: Evaluation of current treatment plan related to dementia and patient's adherence to plan as established by provider Completed joint call with daughter [name] and patient's husband  Determined patient is receiving private paid 24/7 nurse caregiver assistance, caregiver is administering patient's meds, providing meals, providing ROM and exercise as patient will allow, provides assistance with ADL's/iADL's Discussed with daughter and husband, patient has regained her mobility, knee pain is minimal, patient experienced 1 fall while in the bathroom with no know cause, no injuries incurred Discussed patient continues to have symptoms suggestive of advanced stages of Alzheimer and this continues to be difficult for the family to witness Determined family currently has no home safety concerns, no DME needs Discussed caregiver is concerned patient may be having vertigo Educated family about potential SE related to Memantine and Aricept, daughter will send Neurologist a my chart message to report symptoms and ask for recommendations Discussed and reviewed patient's next scheduled visit with PCP is scheduled for 04/24/23, patient completed labs yesterday Instructed family to report new symptoms or concerns to patient's PCP  Discussed plans with patient for ongoing care coordination follow up and provided patient with direct contact information for  nurse care coordinator Patient will remain free from falls and or injuries related to falls Patient's family will contact patient's PCP to report new symptoms or concerns Patient's daughter [name] will communicate with Neurology provide to report possible symptoms of Vertigo Patient's family will consider [facility] for in home primary care and call this RN if a referral is needed Patient's family will continue to work with LCSW as needed for resources and support    Interventions Today    Flowsheet Row Most Recent Value  Chronic Disease   Chronic disease during today's visit Other  [Dementia]  General Interventions   General Interventions Discussed/Reviewed General Interventions Discussed, General Interventions Reviewed, Doctor Visits, Labs, Durable Medical Equipment (DME)  Doctor Visits Discussed/Reviewed Doctor Visits Discussed, Doctor Visits Reviewed, PCP  Durable Medical Equipment (DME) Dan Humphreys, Other  [Cane]  Exercise Interventions   Exercise Discussed/Reviewed Exercise Discussed, Exercise Reviewed, Physical Activity  Physical Activity Discussed/Reviewed Physical Activity Discussed, Physical Activity Reviewed  Education Interventions   Education Provided Provided Education  Provided Verbal Education On When to see the doctor, Nutrition, Medication, Labs, Mental Health/Coping with Illness  Mental Health Interventions   Mental Health Discussed/Reviewed Mental Health Discussed, Mental Health Reviewed, Other  [agitation, behavioral changes w/dementia]  Nutrition Interventions   Nutrition Discussed/Reviewed Nutrition Discussed, Nutrition Reviewed, Fluid intake  Pharmacy Interventions   Pharmacy Dicussed/Reviewed Pharmacy Topics Discussed, Pharmacy Topics Reviewed, Medications and their functions, Medication Adherence  Safety Interventions   Safety Discussed/Reviewed Safety Reviewed, Safety Discussed, Fall Risk, Home Safety  Home Safety Assistive Devices          SDOH  assessments and interventions completed:  No     Care  Coordination Interventions:  Yes, provided   Follow up plan: Follow up call scheduled for 05/01/23 @2 :00 PM    Encounter Outcome:  Patient Visit Completed

## 2023-04-02 ENCOUNTER — Telehealth: Payer: Self-pay | Admitting: *Deleted

## 2023-04-02 DIAGNOSIS — D631 Anemia in chronic kidney disease: Secondary | ICD-10-CM | POA: Diagnosis not present

## 2023-04-02 DIAGNOSIS — F0150 Vascular dementia without behavioral disturbance: Secondary | ICD-10-CM | POA: Diagnosis not present

## 2023-04-02 DIAGNOSIS — I679 Cerebrovascular disease, unspecified: Secondary | ICD-10-CM | POA: Diagnosis not present

## 2023-04-02 DIAGNOSIS — N183 Chronic kidney disease, stage 3 unspecified: Secondary | ICD-10-CM | POA: Diagnosis not present

## 2023-04-02 DIAGNOSIS — M170 Bilateral primary osteoarthritis of knee: Secondary | ICD-10-CM | POA: Diagnosis not present

## 2023-04-02 DIAGNOSIS — I129 Hypertensive chronic kidney disease with stage 1 through stage 4 chronic kidney disease, or unspecified chronic kidney disease: Secondary | ICD-10-CM | POA: Diagnosis not present

## 2023-04-02 DIAGNOSIS — M25462 Effusion, left knee: Secondary | ICD-10-CM | POA: Diagnosis not present

## 2023-04-02 DIAGNOSIS — G8929 Other chronic pain: Secondary | ICD-10-CM | POA: Diagnosis not present

## 2023-04-02 DIAGNOSIS — E1122 Type 2 diabetes mellitus with diabetic chronic kidney disease: Secondary | ICD-10-CM | POA: Diagnosis not present

## 2023-04-02 NOTE — Progress Notes (Signed)
  Care Coordination Note  04/02/2023 Name: [name] MRN: [id number] DOB: 1945-08-03  Lauren Garrett is a 77 y.o. year old female who is a primary care patient of Tisovec, Adelfa Koh, MD and is actively engaged with the care management team. I reached out to Abe People by phone today to assist with re-scheduling a follow up visit with the RN Case Manager  Follow up plan: Unsuccessful telephone outreach attempt made.   [facility]  Care Coordination Care Guide  Direct Dial: [phone]

## 2023-04-03 ENCOUNTER — Other Ambulatory Visit: Payer: Self-pay | Admitting: Neurology

## 2023-04-03 ENCOUNTER — Encounter: Payer: Self-pay | Admitting: Physician Assistant

## 2023-04-03 DIAGNOSIS — R413 Other amnesia: Secondary | ICD-10-CM

## 2023-04-03 DIAGNOSIS — F01518 Vascular dementia, unspecified severity, with other behavioral disturbance: Secondary | ICD-10-CM

## 2023-04-03 DIAGNOSIS — R4189 Other symptoms and signs involving cognitive functions and awareness: Secondary | ICD-10-CM

## 2023-04-07 NOTE — Telephone Encounter (Signed)
  Care Coordination Note  04/07/2023 Name: Lauren Garrett MRN: [id number] DOB: 08/14/1945  [name] is a 77 y.o. year old female who is a primary care patient of Tisovec, Adelfa Koh, MD and is actively engaged with the care management team. I reached out to Abe People by phone today to assist with re-scheduling a follow up visit with the RN Case Manager  Follow up plan: Telephone appointment with care management team member scheduled for:05/04/23  [facility] Coordination Care Guide  Direct Dial: [phone]

## 2023-04-08 ENCOUNTER — Telehealth: Payer: Self-pay | Admitting: Adult Health

## 2023-04-08 DIAGNOSIS — R413 Other amnesia: Secondary | ICD-10-CM

## 2023-04-08 DIAGNOSIS — R4189 Other symptoms and signs involving cognitive functions and awareness: Secondary | ICD-10-CM

## 2023-04-08 DIAGNOSIS — F01518 Vascular dementia, unspecified severity, with other behavioral disturbance: Secondary | ICD-10-CM

## 2023-04-08 MED ORDER — DONEPEZIL HCL 10 MG PO TABS: 10.0000 mg | ORAL_TABLET | Freq: Every day | ORAL | 5 refills | Status: DC

## 2023-04-08 NOTE — Telephone Encounter (Signed)
Refill sent for the patient to the pharmacy

## 2023-04-08 NOTE — Telephone Encounter (Signed)
Pt's son called stating that the pt is needing a refill on her  donepezil (ARICEPT) 10 MG tablet and for some reason it was denied. He would like to know why because the pt is to run out tomorrow and is needing the refill as soon as possible.

## 2023-04-09 DIAGNOSIS — M170 Bilateral primary osteoarthritis of knee: Secondary | ICD-10-CM | POA: Diagnosis not present

## 2023-04-09 DIAGNOSIS — N183 Chronic kidney disease, stage 3 unspecified: Secondary | ICD-10-CM | POA: Diagnosis not present

## 2023-04-09 DIAGNOSIS — M25462 Effusion, left knee: Secondary | ICD-10-CM | POA: Diagnosis not present

## 2023-04-09 DIAGNOSIS — I679 Cerebrovascular disease, unspecified: Secondary | ICD-10-CM | POA: Diagnosis not present

## 2023-04-09 DIAGNOSIS — G8929 Other chronic pain: Secondary | ICD-10-CM | POA: Diagnosis not present

## 2023-04-09 DIAGNOSIS — I129 Hypertensive chronic kidney disease with stage 1 through stage 4 chronic kidney disease, or unspecified chronic kidney disease: Secondary | ICD-10-CM | POA: Diagnosis not present

## 2023-04-09 DIAGNOSIS — E1122 Type 2 diabetes mellitus with diabetic chronic kidney disease: Secondary | ICD-10-CM | POA: Diagnosis not present

## 2023-04-09 DIAGNOSIS — F0150 Vascular dementia without behavioral disturbance: Secondary | ICD-10-CM | POA: Diagnosis not present

## 2023-04-09 DIAGNOSIS — D631 Anemia in chronic kidney disease: Secondary | ICD-10-CM | POA: Diagnosis not present

## 2023-04-20 DIAGNOSIS — M170 Bilateral primary osteoarthritis of knee: Secondary | ICD-10-CM | POA: Diagnosis not present

## 2023-04-23 DIAGNOSIS — Z1289 Encounter for screening for malignant neoplasm of other sites: Secondary | ICD-10-CM | POA: Diagnosis not present

## 2023-04-23 DIAGNOSIS — Z1331 Encounter for screening for depression: Secondary | ICD-10-CM | POA: Diagnosis not present

## 2023-04-23 DIAGNOSIS — Z1339 Encounter for screening examination for other mental health and behavioral disorders: Secondary | ICD-10-CM | POA: Diagnosis not present

## 2023-04-23 DIAGNOSIS — R531 Weakness: Secondary | ICD-10-CM | POA: Diagnosis not present

## 2023-04-23 DIAGNOSIS — Z0000 Encounter for general adult medical examination without abnormal findings: Secondary | ICD-10-CM | POA: Diagnosis not present

## 2023-04-23 DIAGNOSIS — D631 Anemia in chronic kidney disease: Secondary | ICD-10-CM | POA: Diagnosis not present

## 2023-04-23 DIAGNOSIS — E1149 Type 2 diabetes mellitus with other diabetic neurological complication: Secondary | ICD-10-CM | POA: Diagnosis not present

## 2023-04-23 DIAGNOSIS — E039 Hypothyroidism, unspecified: Secondary | ICD-10-CM | POA: Diagnosis not present

## 2023-04-23 DIAGNOSIS — Z23 Encounter for immunization: Secondary | ICD-10-CM | POA: Diagnosis not present

## 2023-04-23 DIAGNOSIS — E1129 Type 2 diabetes mellitus with other diabetic kidney complication: Secondary | ICD-10-CM | POA: Diagnosis not present

## 2023-04-23 DIAGNOSIS — I129 Hypertensive chronic kidney disease with stage 1 through stage 4 chronic kidney disease, or unspecified chronic kidney disease: Secondary | ICD-10-CM | POA: Diagnosis not present

## 2023-04-23 DIAGNOSIS — F01B0 Vascular dementia, moderate, without behavioral disturbance, psychotic disturbance, mood disturbance, and anxiety: Secondary | ICD-10-CM | POA: Diagnosis not present

## 2023-04-23 DIAGNOSIS — N1831 Chronic kidney disease, stage 3a: Secondary | ICD-10-CM | POA: Diagnosis not present

## 2023-04-30 DIAGNOSIS — R82998 Other abnormal findings in urine: Secondary | ICD-10-CM | POA: Diagnosis not present

## 2023-04-30 DIAGNOSIS — E1129 Type 2 diabetes mellitus with other diabetic kidney complication: Secondary | ICD-10-CM | POA: Diagnosis not present

## 2023-05-01 DIAGNOSIS — Z1289 Encounter for screening for malignant neoplasm of other sites: Secondary | ICD-10-CM | POA: Diagnosis not present

## 2023-05-01 DIAGNOSIS — D631 Anemia in chronic kidney disease: Secondary | ICD-10-CM | POA: Diagnosis not present

## 2023-05-04 ENCOUNTER — Ambulatory Visit: Payer: Self-pay

## 2023-05-04 NOTE — Patient Outreach (Signed)
  Care Coordination   Follow Up Visit Note   05/04/2023 Name: Lauren Garrett MRN: [id number] DOB: 02-09-1946  Lauren Garrett is a 78 y.o. year old female who sees Tisovec, Charlie ORN, MD for primary care. I spoke with daughter Lauren Garrett by phone today.  What matters to the patients health and wellness today?  Patient's family may consider transitioning patient to [facility] for in home primary care.     Goals Addressed             This Visit's Progress    COMPLETED: RN Care Coordination Activities: further follow up needed       Care Coordination Interventions: Evaluation of current treatment plan related to dementia and patient's adherence to plan as established by provider Completed joint call with daughter Lauren  Determined patient continues to receive 24/7 nursing care Discussed patient's behavioral outbursts have improved since increasing her Aricept  Educated daughter about Palliative Care verses Hospice Care, provided daughter the contact number for [facility] daughter about [facility] for primary care in the home, provided the contact number for referral coordinator, Katheryn Favors RN to pursue a referral if desired Advised daughter of patient closure from care coordination per PCP provider request     Interventions Today    Flowsheet Row Most Recent Value  Chronic Disease   Chronic disease during today's visit Other  [dementia]  General Interventions   General Interventions Discussed/Reviewed General Interventions Discussed, General Interventions Reviewed, Doctor Visits  Doctor Visits Discussed/Reviewed Doctor Visits Reviewed, Doctor Visits Discussed, PCP  Education Interventions   Education Provided Provided Education  Provided Verbal Education On When to see the doctor          SDOH assessments and interventions completed:  No     Care Coordination Interventions:  Yes, provided   Follow up plan: No further  intervention required.   Encounter Outcome:  Patient Visit Completed

## 2023-05-04 NOTE — Patient Instructions (Signed)
 Visit Information  Thank you for taking time to visit with me today. Please don't hesitate to contact me if I can be of assistance to you.   Following are the goals we discussed today:   Goals Addressed             This Visit's Progress    COMPLETED: RN Care Coordination Activities: further follow up needed       Care Coordination Interventions: Evaluation of current treatment plan related to dementia and patient's adherence to plan as established by provider Completed joint call with daughter Geroge  Determined patient continues to receive 24/7 nursing care Discussed patient's behavioral outbursts have improved since increasing her Aricept  Educated daughter about Palliative Care verses Hospice Care, provided daughter the contact number for [facility]-educated daughter about [facility] for primary care in the home, provided the contact number for referral coordinator, Katheryn Favors RN to pursue a referral if desired Advised daughter of patient closure from care coordination per PCP provider request        If you are experiencing a Mental Health or Behavioral Health Crisis or need someone to talk to, please call 1-800-273-TALK (toll free, 24 hour hotline)  Patient verbalizes understanding of instructions and care plan provided today and agrees to view in MyChart. Active MyChart status and patient understanding of how to access instructions and care plan via MyChart confirmed with patient.     Clayborne Ly RN BSN CCM [place]  [facility], [facility] Nurse Care Coordinator  Direct Dial: [phone] Website: [email or web address]

## 2023-05-10 DIAGNOSIS — N183 Chronic kidney disease, stage 3 unspecified: Secondary | ICD-10-CM | POA: Diagnosis not present

## 2023-05-10 DIAGNOSIS — F0150 Vascular dementia without behavioral disturbance: Secondary | ICD-10-CM | POA: Diagnosis not present

## 2023-05-10 DIAGNOSIS — G8929 Other chronic pain: Secondary | ICD-10-CM | POA: Diagnosis not present

## 2023-05-10 DIAGNOSIS — E1122 Type 2 diabetes mellitus with diabetic chronic kidney disease: Secondary | ICD-10-CM | POA: Diagnosis not present

## 2023-05-10 DIAGNOSIS — M170 Bilateral primary osteoarthritis of knee: Secondary | ICD-10-CM | POA: Diagnosis not present

## 2023-05-10 DIAGNOSIS — I129 Hypertensive chronic kidney disease with stage 1 through stage 4 chronic kidney disease, or unspecified chronic kidney disease: Secondary | ICD-10-CM | POA: Diagnosis not present

## 2023-05-10 DIAGNOSIS — I679 Cerebrovascular disease, unspecified: Secondary | ICD-10-CM | POA: Diagnosis not present

## 2023-05-10 DIAGNOSIS — D631 Anemia in chronic kidney disease: Secondary | ICD-10-CM | POA: Diagnosis not present

## 2023-05-10 DIAGNOSIS — M25462 Effusion, left knee: Secondary | ICD-10-CM | POA: Diagnosis not present

## 2023-05-21 DIAGNOSIS — M170 Bilateral primary osteoarthritis of knee: Secondary | ICD-10-CM | POA: Diagnosis not present

## 2023-06-19 DIAGNOSIS — M170 Bilateral primary osteoarthritis of knee: Secondary | ICD-10-CM | POA: Diagnosis not present

## 2023-06-23 DIAGNOSIS — E039 Hypothyroidism, unspecified: Secondary | ICD-10-CM | POA: Diagnosis not present

## 2023-07-16 DIAGNOSIS — I679 Cerebrovascular disease, unspecified: Secondary | ICD-10-CM | POA: Diagnosis not present

## 2023-07-16 DIAGNOSIS — F0150 Vascular dementia without behavioral disturbance: Secondary | ICD-10-CM | POA: Diagnosis not present

## 2023-07-19 DIAGNOSIS — M170 Bilateral primary osteoarthritis of knee: Secondary | ICD-10-CM | POA: Diagnosis not present

## 2023-07-22 DIAGNOSIS — G629 Polyneuropathy, unspecified: Secondary | ICD-10-CM | POA: Diagnosis not present

## 2023-07-22 DIAGNOSIS — Z23 Encounter for immunization: Secondary | ICD-10-CM | POA: Diagnosis not present

## 2023-07-22 DIAGNOSIS — L680 Hirsutism: Secondary | ICD-10-CM | POA: Diagnosis not present

## 2023-07-22 DIAGNOSIS — N1831 Chronic kidney disease, stage 3a: Secondary | ICD-10-CM | POA: Diagnosis not present

## 2023-07-22 DIAGNOSIS — E1129 Type 2 diabetes mellitus with other diabetic kidney complication: Secondary | ICD-10-CM | POA: Diagnosis not present

## 2023-07-22 DIAGNOSIS — E7800 Pure hypercholesterolemia, unspecified: Secondary | ICD-10-CM | POA: Diagnosis not present

## 2023-07-22 DIAGNOSIS — F01B0 Vascular dementia, moderate, without behavioral disturbance, psychotic disturbance, mood disturbance, and anxiety: Secondary | ICD-10-CM | POA: Diagnosis not present

## 2023-07-22 DIAGNOSIS — I129 Hypertensive chronic kidney disease with stage 1 through stage 4 chronic kidney disease, or unspecified chronic kidney disease: Secondary | ICD-10-CM | POA: Diagnosis not present

## 2023-07-22 DIAGNOSIS — E039 Hypothyroidism, unspecified: Secondary | ICD-10-CM | POA: Diagnosis not present

## 2023-08-14 ENCOUNTER — Other Ambulatory Visit (HOSPITAL_COMMUNITY): Payer: Self-pay

## 2023-08-14 ENCOUNTER — Telehealth: Payer: Self-pay

## 2023-08-14 NOTE — Telephone Encounter (Signed)
 Pharmacy Patient Advocate Encounter   Received notification from Fax that prior authorization for Donepezil  HCl 10MG  tablets is required/requested.   Insurance verification completed.   The patient is insured through [employer] .   Per test claim: Refill too soon. PA is not needed at this time. Medication was filled 08/03/2023. Next eligible fill date is 08/26/2023.

## 2023-08-19 DIAGNOSIS — M170 Bilateral primary osteoarthritis of knee: Secondary | ICD-10-CM | POA: Diagnosis not present

## 2023-09-18 DIAGNOSIS — M170 Bilateral primary osteoarthritis of knee: Secondary | ICD-10-CM | POA: Diagnosis not present

## 2023-10-05 DIAGNOSIS — F01511 Vascular dementia, unspecified severity, with agitation: Secondary | ICD-10-CM | POA: Diagnosis not present

## 2023-10-05 DIAGNOSIS — I679 Cerebrovascular disease, unspecified: Secondary | ICD-10-CM | POA: Diagnosis not present

## 2023-10-19 ENCOUNTER — Encounter: Payer: Self-pay | Admitting: Podiatry

## 2023-10-19 ENCOUNTER — Ambulatory Visit: Admitting: Podiatry

## 2023-10-19 VITALS — Ht 67.0 in | Wt 138.0 lb

## 2023-10-19 DIAGNOSIS — B351 Tinea unguium: Secondary | ICD-10-CM

## 2023-10-19 DIAGNOSIS — M170 Bilateral primary osteoarthritis of knee: Secondary | ICD-10-CM | POA: Diagnosis not present

## 2023-10-19 DIAGNOSIS — M79674 Pain in right toe(s): Secondary | ICD-10-CM | POA: Diagnosis not present

## 2023-10-19 DIAGNOSIS — M79675 Pain in left toe(s): Secondary | ICD-10-CM

## 2023-10-19 NOTE — Progress Notes (Signed)
 Subjective:   Patient ID: Lauren Garrett, female   DOB: 78 y.o.   MRN: [id number]   HPI Patient presents with significant thickness of nailbeds 1-5 both feet that have been bothersome and presents with caregivers   ROS      Objective:  Physical Exam  Neurovascular status intact thick yellow brittle nailbeds 1-5 both feet especially the hallux that can become very painful     Assessment:  Mycotic nail infection 1-5 both feet     Plan:  H&P reviewed she discussed permanent correction but I do not recommend and today I debrided nailbeds 1-5 both feet no iatrogenic bleeding reappoint for routine care with nails being painful preoperatively

## 2023-10-21 ENCOUNTER — Telehealth: Payer: Self-pay

## 2023-10-21 DIAGNOSIS — F02818 Dementia in other diseases classified elsewhere, unspecified severity, with other behavioral disturbance: Secondary | ICD-10-CM

## 2023-10-21 DIAGNOSIS — R413 Other amnesia: Secondary | ICD-10-CM

## 2023-10-21 DIAGNOSIS — R4189 Other symptoms and signs involving cognitive functions and awareness: Secondary | ICD-10-CM

## 2023-10-21 MED ORDER — DONEPEZIL HCL 10 MG PO TABS
10.0000 mg | ORAL_TABLET | Freq: Every day | ORAL | 5 refills | Status: AC
Start: 2023-10-21 — End: ?

## 2023-10-21 NOTE — Telephone Encounter (Signed)
 Donepezil  refill sent to [facility]

## 2023-11-16 DIAGNOSIS — F02B18 Dementia in other diseases classified elsewhere, moderate, with other behavioral disturbance: Secondary | ICD-10-CM | POA: Diagnosis not present

## 2023-11-16 DIAGNOSIS — G301 Alzheimer's disease with late onset: Secondary | ICD-10-CM | POA: Diagnosis not present

## 2023-11-18 DIAGNOSIS — M170 Bilateral primary osteoarthritis of knee: Secondary | ICD-10-CM | POA: Diagnosis not present

## 2023-12-19 DIAGNOSIS — M170 Bilateral primary osteoarthritis of knee: Secondary | ICD-10-CM | POA: Diagnosis not present

## 2024-01-12 DIAGNOSIS — F02B0 Dementia in other diseases classified elsewhere, moderate, without behavioral disturbance, psychotic disturbance, mood disturbance, and anxiety: Secondary | ICD-10-CM | POA: Diagnosis not present

## 2024-01-12 DIAGNOSIS — G301 Alzheimer's disease with late onset: Secondary | ICD-10-CM | POA: Diagnosis not present

## 2024-01-19 DIAGNOSIS — M170 Bilateral primary osteoarthritis of knee: Secondary | ICD-10-CM | POA: Diagnosis not present

## 2024-01-22 DIAGNOSIS — F0150 Vascular dementia without behavioral disturbance: Secondary | ICD-10-CM | POA: Diagnosis not present

## 2024-01-22 DIAGNOSIS — F411 Generalized anxiety disorder: Secondary | ICD-10-CM | POA: Diagnosis not present

## 2024-01-22 DIAGNOSIS — E7800 Pure hypercholesterolemia, unspecified: Secondary | ICD-10-CM | POA: Diagnosis not present

## 2024-01-22 DIAGNOSIS — I10 Essential (primary) hypertension: Secondary | ICD-10-CM | POA: Diagnosis not present

## 2024-01-22 DIAGNOSIS — M170 Bilateral primary osteoarthritis of knee: Secondary | ICD-10-CM | POA: Diagnosis not present

## 2024-01-22 DIAGNOSIS — E039 Hypothyroidism, unspecified: Secondary | ICD-10-CM | POA: Diagnosis not present

## 2024-02-17 DIAGNOSIS — R54 Age-related physical debility: Secondary | ICD-10-CM | POA: Diagnosis not present

## 2024-02-17 DIAGNOSIS — F411 Generalized anxiety disorder: Secondary | ICD-10-CM | POA: Diagnosis not present

## 2024-02-17 DIAGNOSIS — E039 Hypothyroidism, unspecified: Secondary | ICD-10-CM | POA: Diagnosis not present

## 2024-02-17 DIAGNOSIS — M170 Bilateral primary osteoarthritis of knee: Secondary | ICD-10-CM | POA: Diagnosis not present

## 2024-02-17 DIAGNOSIS — E7800 Pure hypercholesterolemia, unspecified: Secondary | ICD-10-CM | POA: Diagnosis not present

## 2024-02-17 DIAGNOSIS — F0150 Vascular dementia without behavioral disturbance: Secondary | ICD-10-CM | POA: Diagnosis not present

## 2024-02-17 DIAGNOSIS — D509 Iron deficiency anemia, unspecified: Secondary | ICD-10-CM | POA: Diagnosis not present

## 2024-02-19 DIAGNOSIS — Z1331 Encounter for screening for depression: Secondary | ICD-10-CM | POA: Diagnosis not present

## 2024-02-19 DIAGNOSIS — E7800 Pure hypercholesterolemia, unspecified: Secondary | ICD-10-CM | POA: Diagnosis not present

## 2024-02-19 DIAGNOSIS — E039 Hypothyroidism, unspecified: Secondary | ICD-10-CM | POA: Diagnosis not present

## 2024-02-19 DIAGNOSIS — E559 Vitamin D deficiency, unspecified: Secondary | ICD-10-CM | POA: Diagnosis not present

## 2024-02-19 DIAGNOSIS — I10 Essential (primary) hypertension: Secondary | ICD-10-CM | POA: Diagnosis not present

## 2024-02-19 DIAGNOSIS — F0150 Vascular dementia without behavioral disturbance: Secondary | ICD-10-CM | POA: Diagnosis not present

## 2024-02-19 DIAGNOSIS — D509 Iron deficiency anemia, unspecified: Secondary | ICD-10-CM | POA: Diagnosis not present

## 2024-02-19 DIAGNOSIS — Z23 Encounter for immunization: Secondary | ICD-10-CM | POA: Diagnosis not present

## 2024-03-02 DIAGNOSIS — E039 Hypothyroidism, unspecified: Secondary | ICD-10-CM | POA: Diagnosis not present

## 2024-03-02 DIAGNOSIS — F411 Generalized anxiety disorder: Secondary | ICD-10-CM | POA: Diagnosis not present

## 2024-03-02 DIAGNOSIS — R54 Age-related physical debility: Secondary | ICD-10-CM | POA: Diagnosis not present

## 2024-03-02 DIAGNOSIS — M170 Bilateral primary osteoarthritis of knee: Secondary | ICD-10-CM | POA: Diagnosis not present

## 2024-03-02 DIAGNOSIS — D509 Iron deficiency anemia, unspecified: Secondary | ICD-10-CM | POA: Diagnosis not present

## 2024-03-02 DIAGNOSIS — E7800 Pure hypercholesterolemia, unspecified: Secondary | ICD-10-CM | POA: Diagnosis not present

## 2024-03-02 DIAGNOSIS — F0150 Vascular dementia without behavioral disturbance: Secondary | ICD-10-CM | POA: Diagnosis not present
